# Patient Record
Sex: Male | Born: 1945 | Race: White | Hispanic: No | Marital: Married | Smoking: Former smoker
Health system: Southern US, Community
[De-identification: ages and names within clinical notes are randomized; demographics above are authoritative.]

## PROBLEM LIST (undated history)

## (undated) DIAGNOSIS — I714 Abdominal aortic aneurysm, without rupture, unspecified: Secondary | ICD-10-CM

## (undated) DIAGNOSIS — I4821 Permanent atrial fibrillation: Secondary | ICD-10-CM

## (undated) DIAGNOSIS — I6523 Occlusion and stenosis of bilateral carotid arteries: Secondary | ICD-10-CM

## (undated) DIAGNOSIS — K219 Gastro-esophageal reflux disease without esophagitis: Secondary | ICD-10-CM

## (undated) DIAGNOSIS — J383 Other diseases of vocal cords: Secondary | ICD-10-CM

## (undated) HISTORY — DX: Occlusion and stenosis of bilateral carotid arteries: I65.23

## (undated) HISTORY — DX: Abdominal aortic aneurysm, without rupture, unspecified: I71.40

## (undated) HISTORY — DX: Abdominal aortic aneurysm, without rupture: I71.4

## (undated) HISTORY — DX: Gastro-esophageal reflux disease without esophagitis: K21.9

## (undated) HISTORY — DX: Permanent atrial fibrillation: I48.21

---

## 2009-03-22 ENCOUNTER — Encounter: Admission: RE | Admit: 2009-03-22 | Discharge: 2009-03-22 | Payer: Self-pay | Admitting: Family Medicine

## 2009-03-22 IMAGING — US US SOFT TISSUE HEAD/NECK
1 series · 14 of 18 positions shown · non-contrast
Comparison: None

CLINICAL DATA: Cyst versus mass at the right mandibular angle of
the neck.

THYROID ULTRASOUND
TECHNIQUE: Ultrasound examination of the thyroid gland and
adjacent soft tissues was performed.

[Series 1: us soft tissue head/neck · 0.08mm/px · 14 of 18 slices shown]
[im 1/18]
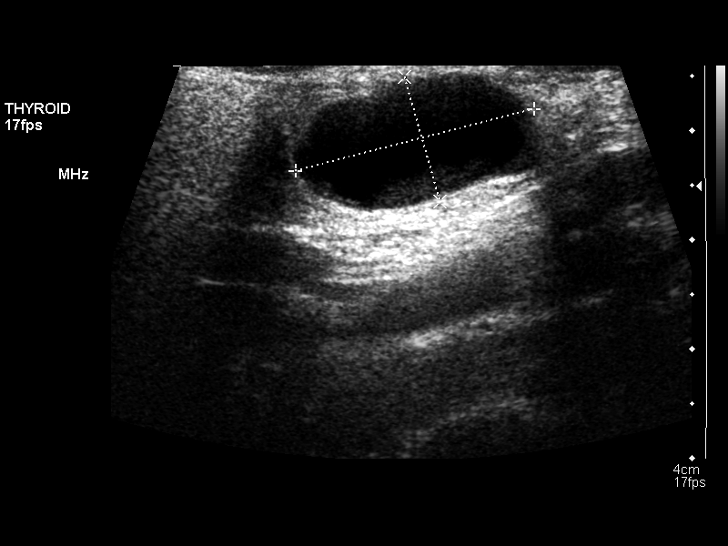
[im 2/18]
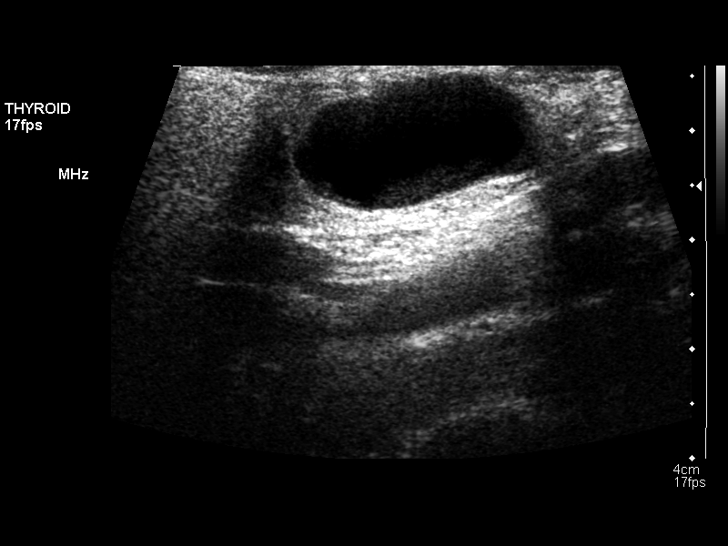
[im 4/18]
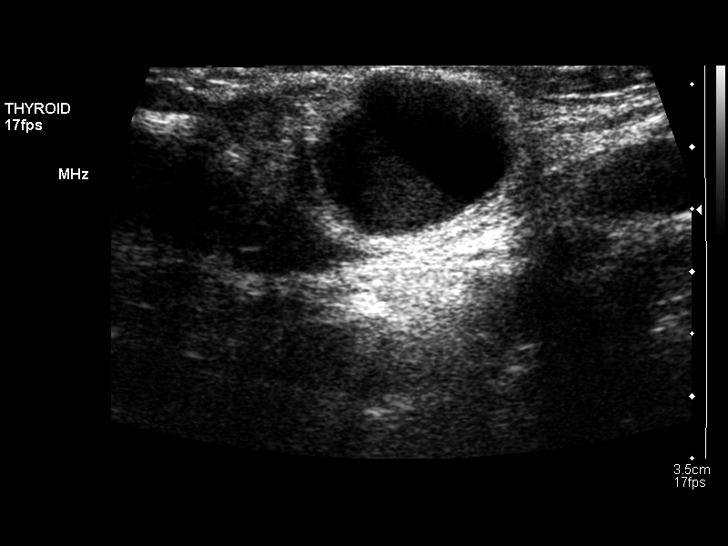
[im 5/18]
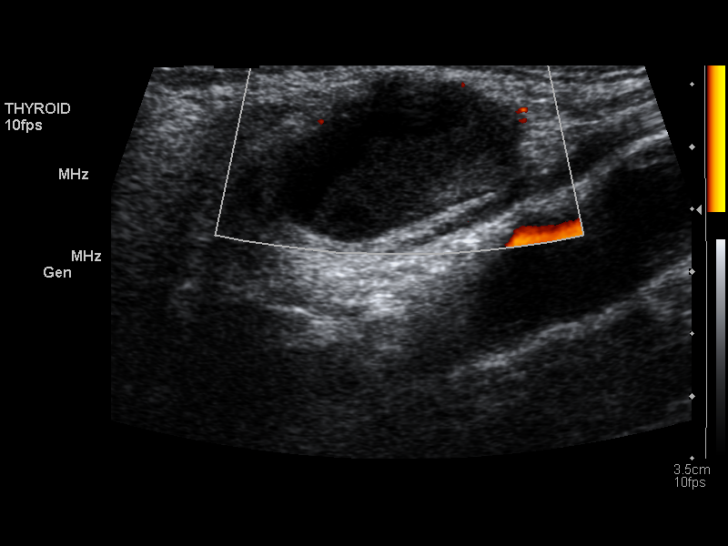
[im 6/18]
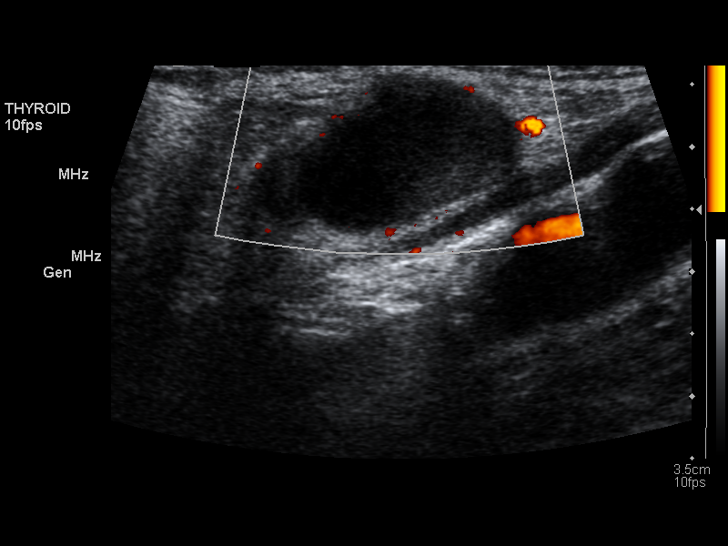
[im 8/18]
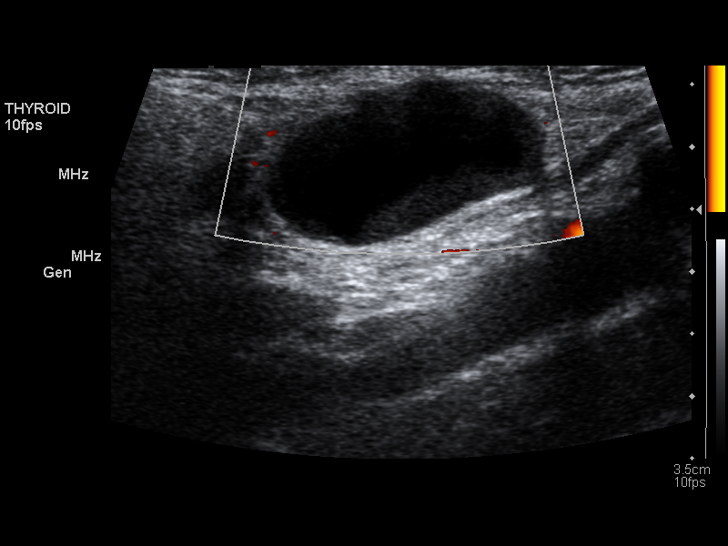
[im 9/18]
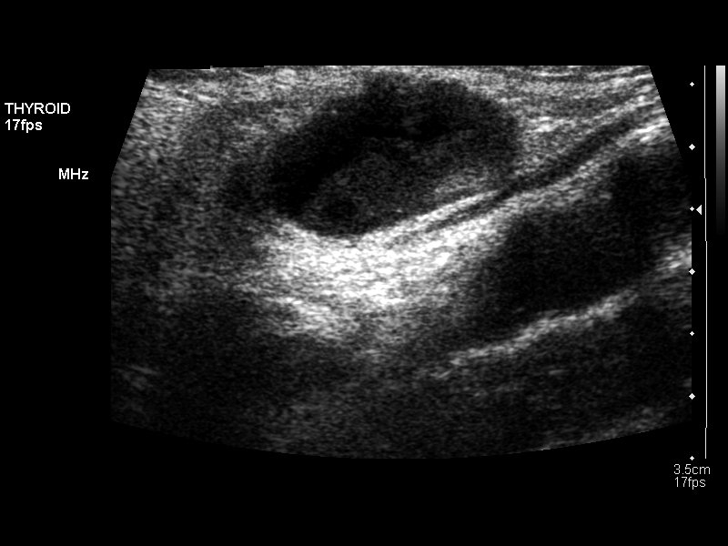
[im 10/18]
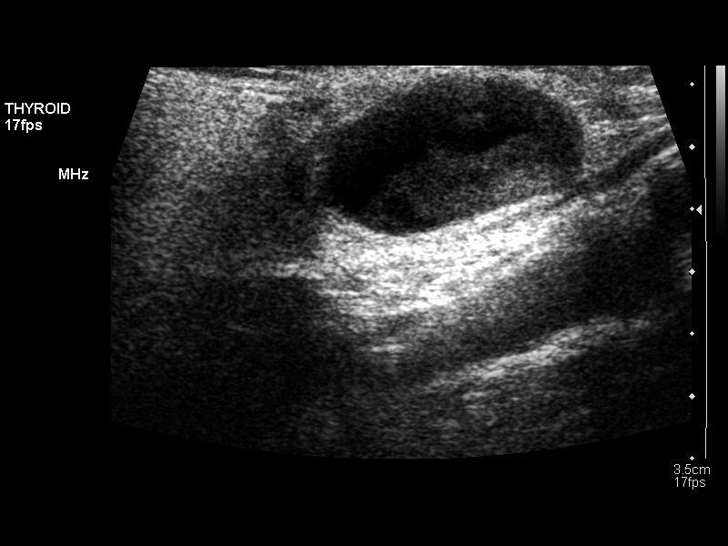
[im 11/18]
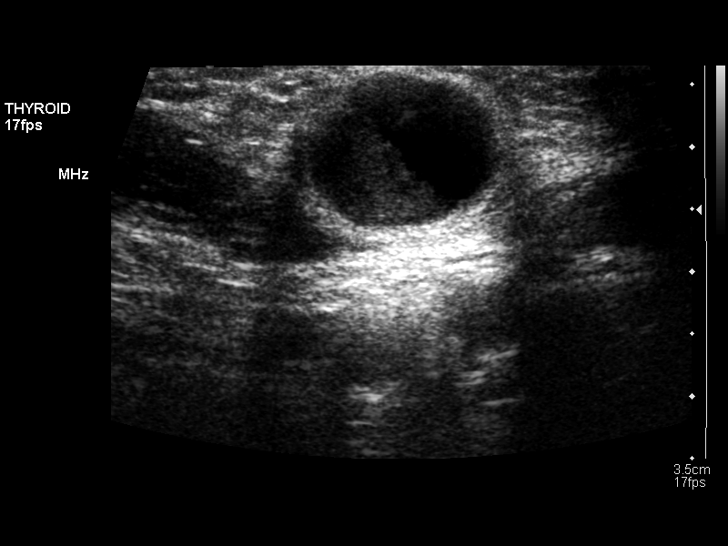
[im 13/18]
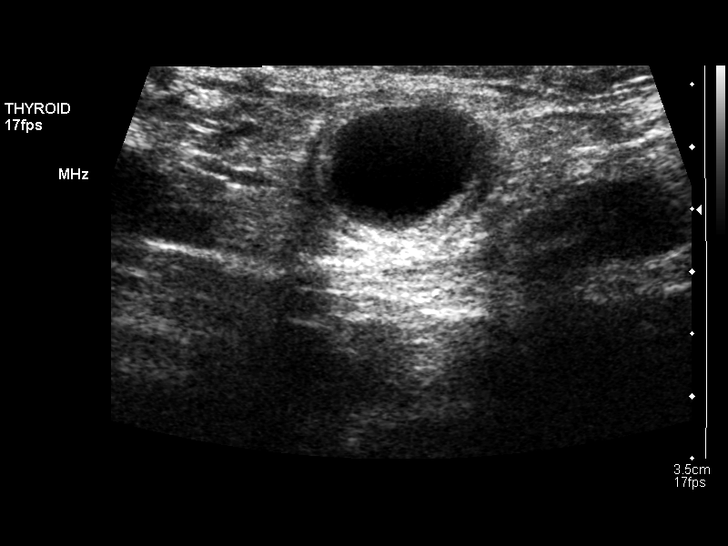
[im 14/18]
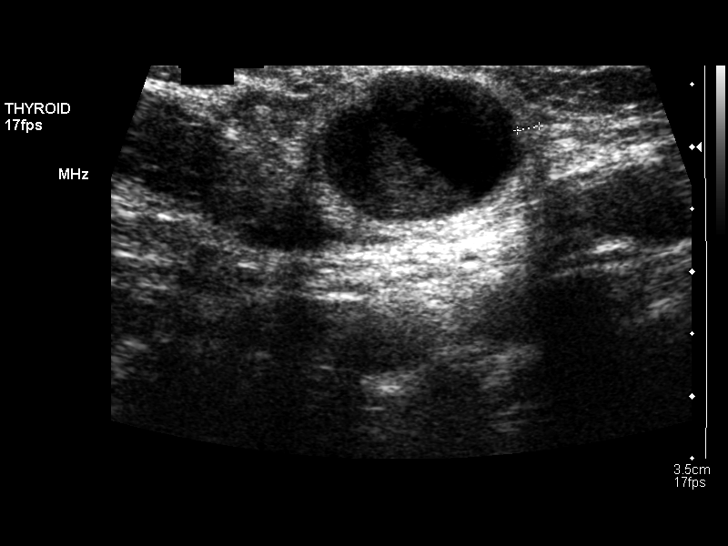
[im 15/18]
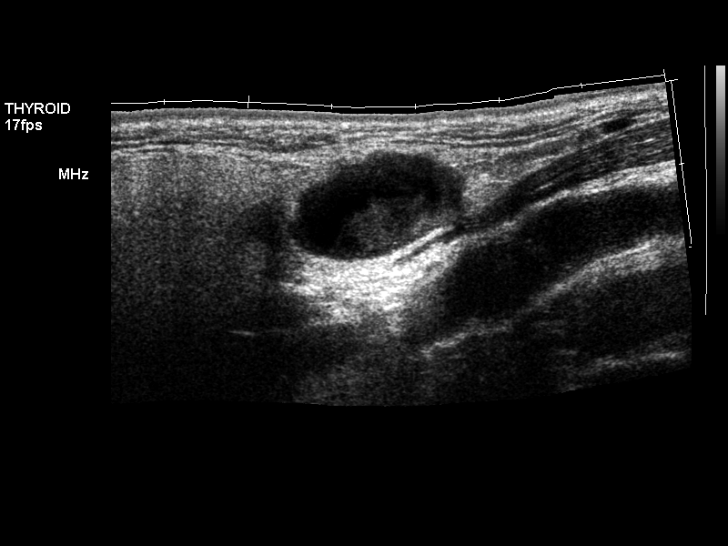
[im 17/18]
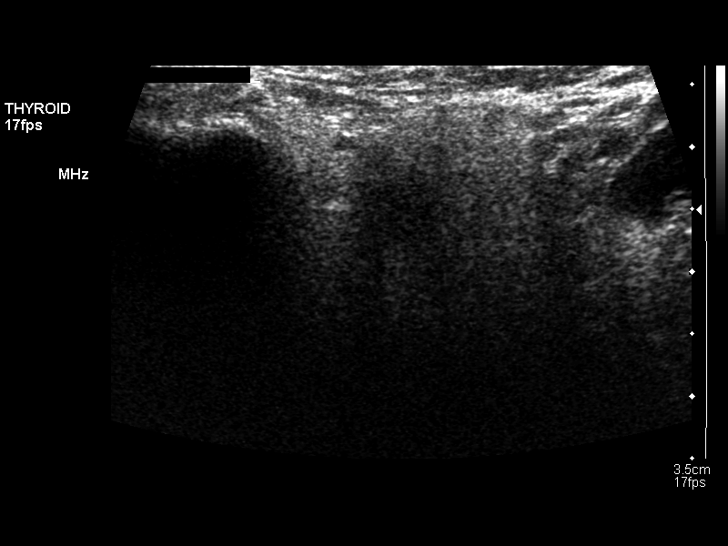
[im 18/18]
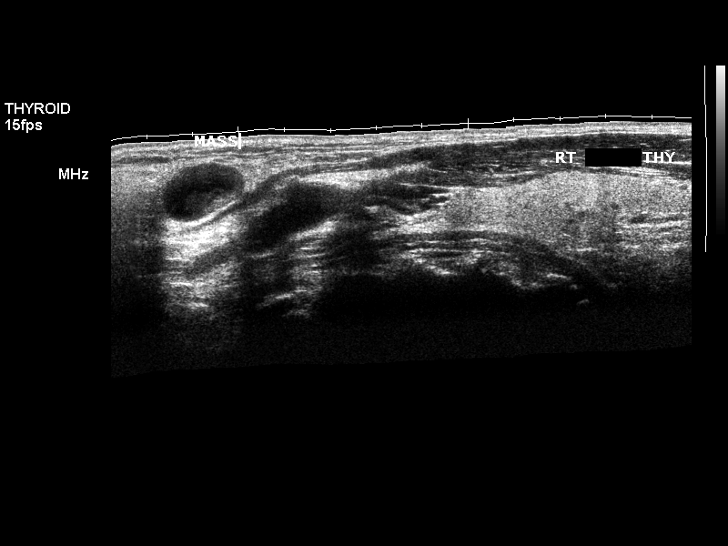

[14 of 18 positions shown; findings below may reference images not displayed]

FINDINGS: In region of palpable finding is discrete cyst with
slight dependent echogenic fluid measuring 2.3 cm long X 1.2 cm AP
X 1.6 cm wide.  No internal or peripheral vascularity related to
this cyst.  No other sonographic cervical adenopathy is seen.  Cyst
is extrinsic to the right parotid and thyroid gland.
IMPRESSION: 1.  Slightly complex marginated cyst at the right jugulodigastric
and neck.  Differential diagnosis includes complex developmental
branchial cleft cyst, dermoid cyst, and  salivary gland cyst within
remnant of the inferior right parotid gland (atypical), and less
likely solitary cystic adenitis or neoplastic solitary adenopathy.
Recommend cyst aspiration for analysis of contents as clinically
indicated.
2.  Otherwise, no significant abnormality.

## 2009-04-22 ENCOUNTER — Ambulatory Visit: Payer: Self-pay | Admitting: Gastroenterology

## 2011-12-27 ENCOUNTER — Encounter: Payer: Self-pay | Admitting: Cardiovascular Disease

## 2012-01-07 ENCOUNTER — Institutional Professional Consult (permissible substitution): Payer: Self-pay | Admitting: Cardiovascular Disease

## 2012-01-28 ENCOUNTER — Encounter: Payer: Self-pay | Admitting: Cardiovascular Disease

## 2012-01-28 ENCOUNTER — Ambulatory Visit (INDEPENDENT_AMBULATORY_CARE_PROVIDER_SITE_OTHER): Payer: Medicare Other | Admitting: Cardiovascular Disease

## 2012-01-28 VITALS — BP 162/78 | HR 59 | Ht 73.0 in | Wt 203.0 lb

## 2012-01-28 DIAGNOSIS — R9431 Abnormal electrocardiogram [ECG] [EKG]: Secondary | ICD-10-CM

## 2012-01-28 DIAGNOSIS — R0989 Other specified symptoms and signs involving the circulatory and respiratory systems: Secondary | ICD-10-CM

## 2012-01-28 DIAGNOSIS — I10 Essential (primary) hypertension: Secondary | ICD-10-CM

## 2012-01-28 NOTE — Assessment & Plan Note (Signed)
Blood pressure is elevated on a single antihypertensive drug. I asked the patient to obtain a home blood pressure cuff and bring his readings than when he returns for followup. I suspect he will require a second drug.

## 2012-01-28 NOTE — Assessment & Plan Note (Addendum)
The patient has cardiovascular risk factors of hypertension, former tobacco use, and family history of CAD albeit not premature CAD. His EKG is concerning for a prior anteroseptal infarct. However, it's possible that this is all related to an intraventricular conduction delay. The patient has evidence of subclinical atherosclerosis with a right carotid bruit on exam. I would recommend proceeding with an exercise Myoview stress test to rule out obstructive CAD and to evaluate him for evidence of a prior myocardial infarction. Will followup in about 4-6 weeks with a return office visit as well.  ADDENDUM (02/24/12): Received notice that pt is to have colonoscopy and clearance requested. He is at low risk of cardiovascular problems related to colonoscopy and can proceed without further testing. He recently had a stress test demonstrating no significant ischemia.  [name] 02/24/2012 7:28 PM

## 2012-01-28 NOTE — Assessment & Plan Note (Signed)
Will check a carotid duplex to rule out significant carotid stenosis.

## 2012-01-28 NOTE — Progress Notes (Signed)
HPI:  66 year old gentleman presenting for initial cardiac evaluation. The patient is referred because of an abnormal EKG. His EKG shows an intraventricular conduction delay and question of anteroseptal myocardial infarction age undetermined. The patient has been previously healthy. He does have hypertension which is newly diagnosed. He is not received routine medical care over the years, but he recently established with a primary care provider. He has been started on lisinopril 20 mg daily.  The patient is physically active. He does hard work and has no symptoms with exertion. He specifically denies chest pain or pressure. He notices that he gets a little bit out of breath when he climbs up to 4 flights of stairs at one time. He denies orthopnea, edema, palpitations, or weakness. He has occasional lightheadedness but has not had syncope. He has never had a prolonged episode of chest pain.  Outpatient Encounter Prescriptions as of 01/28/2012  Medication Sig Dispense Refill  . Ascorbic Acid (VITAMIN C) 1000 MG tablet Take 1,000 mg by mouth 2 (two) times daily.      Marland Kitchen aspirin 81 MG tablet Take 81 mg by mouth daily.      . cyanocobalamin 2000 MCG tablet Take 2,000 mcg by mouth daily.      Marland Kitchen lisinopril (PRINIVIL,ZESTRIL) 20 MG tablet Take 20 mg by mouth daily.       . Multiple Vitamin (MULTIVITAMIN) tablet Take 1 tablet by mouth daily.      Marland Kitchen omeprazole (PRILOSEC) 10 MG capsule Take 10 mg by mouth daily.      . vitamin E 400 UNIT capsule Take 400 Units by mouth daily.        Sulfonamide derivatives  Past Medical History  Diagnosis Date  . Essential hypertension   . Gastroesophageal reflux disease     No past surgical history on file.  History   Social History  . Marital Status: Divorced    Spouse Name: N/A    Number of Children: N/A  . Years of Education: N/A   Occupational History  . Not on file.   Social History Main Topics  . Smoking status: Former Games developer  . Smokeless tobacco: Not  on file  . Alcohol Use: Not on file  . Drug Use: Not on file  . Sexually Active: Not on file   Other Topics Concern  . Not on file   Social History Narrative  . No narrative on file    Family History  Problem Relation Age of Onset  . Coronary artery disease Mother 12    myocardial infarction    ROS: General: no fevers/chills/night sweats Eyes: no blurry vision, diplopia, or amaurosis ENT: no sore throat or hearing loss Resp: no cough, wheezing, or hemoptysis CV: no edema or palpitations GI: no abdominal pain, nausea, vomiting, diarrhea, or constipation GU: no dysuria, frequency, or hematuria Skin: no rash Neuro: no headache, numbness, tingling, or weakness of extremities Musculoskeletal: no joint pain or swelling Heme: no bleeding, DVT, or easy bruising Endo: no polydipsia or polyuria  BP 162/78  Pulse 59  Ht 6\' 1"  (1.854 m)  Wt 92.08 kg (203 lb)  BMI 26.78 kg/m2  PHYSICAL EXAM: Pt is alert and oriented, WD, WN, in no distress. HEENT: normal Neck: JVP normal. Carotid upstrokes normal with a right carotid bruit. No thyromegaly. Lungs: equal expansion, clear bilaterally CV: Apex is discrete and nondisplaced, RRR without murmur or gallop Abd: soft, NT, +BS, no bruit, no hepatosplenomegaly Back: no CVA tenderness Ext: no C/C/E  Femoral pulses 2+= without bruits        DP/PT pulses intact and = Skin: warm and dry without rash Neuro: CNII-XII intact             Strength intact = bilaterally  EKG:  Sinus bradycardia 59 beats per minute tonic anteroseptal infarct age undetermined. Nonspecific IVCD.  ASSESSMENT AND PLAN:

## 2012-01-28 NOTE — Patient Instructions (Addendum)
Your physician has requested that you have a carotid duplex. This test is an ultrasound of the carotid arteries in your neck. It looks at blood flow through these arteries that supply the brain with blood. Allow one hour for this exam. There are no restrictions or special instructions.  Your physician has requested that you have a lexiscan myoview. For further information please visit [email or web address] Please follow instruction sheet, as given.  The current medical regimen is effective;  continue present plan and medications.  Please obtain a blood pressure monitor and keep a daily log of your readings.  Follow up in 6 weeks

## 2012-01-30 NOTE — Progress Notes (Signed)
Addended by: Judithe Modest D on: 01/30/2012 03:04 PM   Modules accepted: Orders

## 2012-02-01 ENCOUNTER — Encounter (INDEPENDENT_AMBULATORY_CARE_PROVIDER_SITE_OTHER): Payer: Medicare Other

## 2012-02-01 DIAGNOSIS — R0989 Other specified symptoms and signs involving the circulatory and respiratory systems: Secondary | ICD-10-CM

## 2012-02-01 DIAGNOSIS — I6529 Occlusion and stenosis of unspecified carotid artery: Secondary | ICD-10-CM

## 2012-02-01 DIAGNOSIS — R9431 Abnormal electrocardiogram [ECG] [EKG]: Secondary | ICD-10-CM

## 2012-02-07 ENCOUNTER — Ambulatory Visit (HOSPITAL_COMMUNITY): Payer: Medicare Other | Attending: Cardiology | Admitting: Radiology

## 2012-02-07 VITALS — BP 160/72 | Ht 73.0 in | Wt 195.0 lb

## 2012-02-07 DIAGNOSIS — Z8249 Family history of ischemic heart disease and other diseases of the circulatory system: Secondary | ICD-10-CM | POA: Insufficient documentation

## 2012-02-07 DIAGNOSIS — I10 Essential (primary) hypertension: Secondary | ICD-10-CM | POA: Insufficient documentation

## 2012-02-07 DIAGNOSIS — Z87891 Personal history of nicotine dependence: Secondary | ICD-10-CM | POA: Insufficient documentation

## 2012-02-07 DIAGNOSIS — R42 Dizziness and giddiness: Secondary | ICD-10-CM | POA: Insufficient documentation

## 2012-02-07 DIAGNOSIS — R0602 Shortness of breath: Secondary | ICD-10-CM

## 2012-02-07 DIAGNOSIS — R0609 Other forms of dyspnea: Secondary | ICD-10-CM | POA: Insufficient documentation

## 2012-02-07 DIAGNOSIS — R9431 Abnormal electrocardiogram [ECG] [EKG]: Secondary | ICD-10-CM

## 2012-02-07 DIAGNOSIS — I447 Left bundle-branch block, unspecified: Secondary | ICD-10-CM | POA: Insufficient documentation

## 2012-02-07 DIAGNOSIS — R0989 Other specified symptoms and signs involving the circulatory and respiratory systems: Secondary | ICD-10-CM | POA: Insufficient documentation

## 2012-02-07 MED ORDER — TECHNETIUM TC 99M TETROFOSMIN IV KIT
11.0000 | PACK | Freq: Once | INTRAVENOUS | Status: AC | PRN
  Administered 2012-02-07: 11 via INTRAVENOUS

## 2012-02-07 MED ORDER — TECHNETIUM TC 99M TETROFOSMIN IV KIT
32.9000 | PACK | Freq: Once | INTRAVENOUS | Status: AC | PRN
  Administered 2012-02-07: 32.9 via INTRAVENOUS

## 2012-02-07 MED ORDER — REGADENOSON 0.4 MG/5ML IV SOLN
0.4000 mg | Freq: Once | INTRAVENOUS | Status: AC
  Administered 2012-02-07: 0.4 mg via INTRAVENOUS

## 2012-02-07 NOTE — Progress Notes (Signed)
[facility] SITE 3 NUCLEAR MED [address] [place] [address] [phone]  Cardiology Nuclear Med Study  Leroy Lara is a 66 y.o. male [id number] 02-01-1946   Nuclear Med Background Indication for Stress Test:  Evaluation for Ischemia and Abnormal EKG History:  No previous documented CAD Cardiac Risk Factors: Family History - CAD, History of Smoking, Hypertension and LBBB  Symptoms:  DOE and Light-Headedness   Nuclear Pre-Procedure Caffeine/Decaff Intake:  None NPO After: 7:30pm   Lungs:  Clear.  O2 SAT 96%. IV 0.9% NS with Angio Cath:  20g  IV Site: R Antecubital x 1, tolerated well IV Started by:  Irean Hong, RN  Chest Size (in):  42 Cup Size: n/a  Height: 6\' 1"  (1.854 m)  Weight:  195 lb (88.451 kg)  BMI:  Body mass index is 25.73 kg/(m^2). Tech Comments:  AM medications taken, per patient.    Nuclear Med Study 1 or 2 day study: 1 day  Stress Test Type:  Eugenie Birks  Reading MD: Marca Ancona, MD  Order Authorizing Provider:  [name], MD  Resting Radionuclide: Technetium 32m Tetrofosmin  Resting Radionuclide Dose: 11.0 mCi   Stress Radionuclide:  Technetium 68m Tetrofosmin  Stress Radionuclide Dose: 32.9 mCi           Stress Protocol Rest HR: 59 Stress HR: 83  Rest BP: Sitting:160/72  Standing:167/90 Stress BP: 156/90  Exercise Time (min): n/a METS: n/a   Predicted Max HR: 155 bpm % Max HR: 53.55 bpm Rate Pressure Product: 21308   Dose of Adenosine (mg):  n/a Dose of Lexiscan: 0.4 mg  Dose of Atropine (mg): n/a Dose of Dobutamine: n/a mcg/kg/min (at max HR)  Stress Test Technologist: Smiley Houseman, CMA-N  Nuclear Technologist:  Domenic Polite, CNMT     Rest Procedure:  Myocardial perfusion imaging was performed at rest 45 minutes following the intravenous administration of Technetium 52m Tetrofosmin.  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  Stress Procedure: The patient was initially scheduled  to walk the treadmill utilizing the Bruce protocol, but his EKG showed  a LBBB on baseline and was changed to [place], per Dr. Excell Seltzer.  The patient then received IV Lexiscan 0.4 mg over 15-seconds.  Technetium 2m Tetrofosmin injected at 30-seconds.  There were no significant changes with Lexiscan.  Quantitative spect images were obtained after a 45 minute delay.  Stress ECG: No significant change from baseline ECG  QPS Raw Data Images:  Normal; no motion artifact; normal heart/lung ratio. Stress Images:  Small apical perfusion defect.  Rest Images:  Small apical perfusion defect.  Subtraction (SDS):  Small fixed apical perfusion defect.  Transient Ischemic Dilatation (Normal <1.22):  1.09 Lung/Heart Ratio (Normal <0.45):  0.27  Quantitative Gated Spect Images QGS EDV:  185 ml QGS ESV:  101 ml QGS cine images:  Global hypokinesis.  QGS EF: 46%  Impression Exercise Capacity:  Lexiscan with no exercise. BP Response:  Normal blood pressure response. Clinical Symptoms:  Chest tightness.  ECG Impression:  LBBB, no change with infusion.  Comparison with Prior Nuclear Study: No previous nuclear study performed  Overall Impression:  Low risk stress nuclear study.  Small fixed apical perfusion defect likely represents apical thinning.  No evidence for ischemia or infarction.  EF is mildly decreased at 42%.  Suggest echo to reassess LV systolic function.   Leroy Lara [employer]

## 2012-02-14 ENCOUNTER — Other Ambulatory Visit: Payer: Self-pay

## 2012-02-14 DIAGNOSIS — R9431 Abnormal electrocardiogram [ECG] [EKG]: Secondary | ICD-10-CM

## 2012-02-14 DIAGNOSIS — I10 Essential (primary) hypertension: Secondary | ICD-10-CM

## 2012-02-21 ENCOUNTER — Telehealth: Payer: Self-pay | Admitting: Cardiovascular Disease

## 2012-02-21 NOTE — Telephone Encounter (Signed)
Pt is having a colonoscopy on 3/18 with Dr. Stephens Shire and he needs a letter a clearance the office phone# [phone]  Fax# [phone]

## 2012-02-21 NOTE — Telephone Encounter (Signed)
Spoke with patient and he is scheduled April 18/19 for colonoscopy.  Explained Dr Excell Seltzer out of office until next week, will forward to him as well as Customer service manager.  Advised someone would call him once completed.

## 2012-02-25 NOTE — Telephone Encounter (Signed)
Dr Excell Seltzer did addendum on 01/28/12 office note.  Note faxed to Dr [facility] office. I left a detailed message on the pt's voicemail that he is cleared for colonoscopy.

## 2012-02-26 ENCOUNTER — Ambulatory Visit (HOSPITAL_COMMUNITY): Payer: Medicare Other | Attending: Cardiology

## 2012-02-26 ENCOUNTER — Other Ambulatory Visit: Payer: Self-pay

## 2012-02-26 DIAGNOSIS — I10 Essential (primary) hypertension: Secondary | ICD-10-CM | POA: Insufficient documentation

## 2012-02-26 DIAGNOSIS — R9431 Abnormal electrocardiogram [ECG] [EKG]: Secondary | ICD-10-CM | POA: Insufficient documentation

## 2012-04-08 ENCOUNTER — Encounter: Payer: Self-pay | Admitting: Cardiovascular Disease

## 2012-04-08 ENCOUNTER — Ambulatory Visit (INDEPENDENT_AMBULATORY_CARE_PROVIDER_SITE_OTHER): Payer: Medicare Other | Admitting: Cardiovascular Disease

## 2012-04-08 VITALS — BP 150/84 | HR 57 | Ht 73.0 in | Wt 201.0 lb

## 2012-04-08 DIAGNOSIS — I10 Essential (primary) hypertension: Secondary | ICD-10-CM

## 2012-04-08 MED ORDER — LISINOPRIL-HYDROCHLOROTHIAZIDE 20-25 MG PO TABS: 1.0000 | ORAL_TABLET | Freq: Every day | ORAL | Status: DC

## 2012-04-08 NOTE — Patient Instructions (Signed)
Your physician wants you to follow-up in: 1 year with Dr. Excell Seltzer. You will receive a reminder letter in the mail two months in advance. If you don't receive a letter, please call our office to schedule the follow-up appointment.  Lab work in 2 weeks.  BMET  Stop taking plain lisinopril.  Start Lisinopril/HCTZ 20/25 daily.

## 2012-04-09 ENCOUNTER — Encounter: Payer: Self-pay | Admitting: Cardiovascular Disease

## 2012-04-09 NOTE — Progress Notes (Signed)
   HPI:  This is a 66 year old gentleman presenting for followup evaluation. The patient was evaluated in February 2013 for an abnormal EKG. He underwent an echocardiogram and a stress Myoview scan. No significant abnormalities were noted. There was a small, fixed apical perfusion defect on the nuclear scan but there was no evidence of reversible ischemia. There was a question of decreased left ventricular ejection fraction, calculated at 42%. However, the patient's followup echocardiogram showed normal LV function without regional wall motion abnormalities. His LVEF was in the range of 50-55%.  He feels well. He denies chest pain, dyspnea, or palpitations. He notes that his blood pressures have been elevated even on home readings with systolics in the 150s.  Outpatient Encounter Prescriptions as of 04/08/2012  Medication Sig Dispense Refill  . Ascorbic Acid (VITAMIN C) 1000 MG tablet Take 1,000 mg by mouth 2 (two) times daily.      Marland Kitchen aspirin 81 MG tablet Take 81 mg by mouth daily.      . cyanocobalamin 2000 MCG tablet Take 2,000 mcg by mouth daily.      . Multiple Vitamin (MULTIVITAMIN) tablet Take 1 tablet by mouth daily.      Marland Kitchen omeprazole (PRILOSEC) 10 MG capsule Take 10 mg by mouth daily.      . vitamin E 400 UNIT capsule Take 400 Units by mouth daily.      Marland Kitchen DISCONTD: lisinopril (PRINIVIL,ZESTRIL) 20 MG tablet Take 20 mg by mouth daily.       Marland Kitchen lisinopril-hydrochlorothiazide (PRINZIDE,ZESTORETIC) 20-25 MG per tablet Take 1 tablet by mouth daily.  30 tablet  11    Allergies  Allergen Reactions  . Sulfonamide Derivatives     REACTION: Painful eyes    Past Medical History  Diagnosis Date  . Essential hypertension   . Gastroesophageal reflux disease     ROS: Negative except as per HPI  BP 150/84  Pulse 57  Ht 6\' 1"  (1.854 m)  Wt 91.173 kg (201 lb)  BMI 26.52 kg/m2  PHYSICAL EXAM: Pt is alert and oriented, NAD HEENT: normal Neck: JVP - normal, carotids 2+= with a right carotid  bruit Lungs: CTA bilaterally CV: RRR without murmur or gallop Abd: soft, NT, Positive BS, no hepatomegaly Ext: no C/C/E, distal pulses intact and equal Skin: warm/dry no rash  ASSESSMENT AND PLAN: Essential hypertension: The patient's blood pressure control is suboptimal. He is currently on lisinopril 20 mg daily. I recommended that he change to lisinopril/hydrochlorothiazide 20/25 milligrams daily. He should have a metabolic panel checked in about 2 weeks. We will continue to follow with his primary care physician and I will plan on seeing him back in one year for followup.  [name] 04/09/2012

## 2012-04-22 ENCOUNTER — Other Ambulatory Visit (INDEPENDENT_AMBULATORY_CARE_PROVIDER_SITE_OTHER): Payer: Medicare Other

## 2012-04-22 DIAGNOSIS — I10 Essential (primary) hypertension: Secondary | ICD-10-CM

## 2012-04-22 LAB — BASIC METABOLIC PANEL
BUN: 19 mg/dL (ref 6–23)
Calcium: 8.7 mg/dL (ref 8.4–10.5)
Creatinine, Ser: 0.8 mg/dL (ref 0.4–1.5)
GFR: 101.31 mL/min (ref >60.00)

## 2013-02-27 ENCOUNTER — Encounter (INDEPENDENT_AMBULATORY_CARE_PROVIDER_SITE_OTHER): Payer: Medicare Other

## 2013-02-27 DIAGNOSIS — I6529 Occlusion and stenosis of unspecified carotid artery: Secondary | ICD-10-CM

## 2013-04-24 ENCOUNTER — Ambulatory Visit (INDEPENDENT_AMBULATORY_CARE_PROVIDER_SITE_OTHER): Payer: Medicare Other | Admitting: Cardiovascular Disease

## 2013-04-24 ENCOUNTER — Encounter: Payer: Self-pay | Admitting: Cardiovascular Disease

## 2013-04-24 VITALS — BP 124/86 | HR 55 | Ht 73.0 in | Wt 204.0 lb

## 2013-04-24 DIAGNOSIS — R9431 Abnormal electrocardiogram [ECG] [EKG]: Secondary | ICD-10-CM

## 2013-04-24 DIAGNOSIS — E7800 Pure hypercholesterolemia, unspecified: Secondary | ICD-10-CM

## 2013-04-24 DIAGNOSIS — I10 Essential (primary) hypertension: Secondary | ICD-10-CM

## 2013-04-24 MED ORDER — LOSARTAN POTASSIUM-HCTZ 50-12.5 MG PO TABS: 1.0000 | ORAL_TABLET | Freq: Every day | ORAL | Status: DC

## 2013-04-24 NOTE — Patient Instructions (Addendum)
Your physician has recommended you make the following change in your medication: STOP Lisinopril HCT, START Losartan HCT 50/12.5mg  take one by mouth daily  Your physician recommends that you return for a FASTING LIPID, LIVER and BMP--nothing to eat or drink after midnight, lab opens at 7:30  Your physician wants you to follow-up in: 1 YEAR with Dr Excell Seltzer.  You will receive a reminder letter in the mail two months in advance. If you don't receive a letter, please call our office to schedule the follow-up appointment.

## 2013-04-24 NOTE — Progress Notes (Signed)
   HPI:  67 year old gentleman presenting for followup evaluation. The patient was initially seen in 2013 for evaluation of an abnormal EKG. He underwent an echocardiogram and a stress nuclear scan. There were no significant animality is noted. He did have a small fixed apical perfusion defect on his nuclear stress test but there was no evidence of reversible ischemia. His gated left ventricular ejection fraction was low at 42%, but an echocardiogram was performed in this showed an estimated LVEF of 50-55%.  The patient is doing fine. He remains very active with his work. He denies exertional symptoms. He specifically denies chest pain, chest pressure, shortness of breath, edema, or palpitations. He does admit to occasional dizziness.  He ran out of his antihypertensive medicines and has been off of them now for several weeks. He noticed that his chronic cough has resolved since stopping lisinopril.    Outpatient Encounter Prescriptions as of 04/24/2013  Medication Sig Dispense Refill  . Ascorbic Acid (VITAMIN C) 1000 MG tablet Take 1,000 mg by mouth 2 (two) times daily.      Marland Kitchen aspirin 81 MG tablet Take 81 mg by mouth daily.      . cyanocobalamin 2000 MCG tablet Take 2,000 mcg by mouth daily.      Marland Kitchen lisinopril-hydrochlorothiazide (PRINZIDE,ZESTORETIC) 20-25 MG per tablet Take 1 tablet by mouth daily.  30 tablet  11  . Multiple Vitamin (MULTIVITAMIN) tablet Take 1 tablet by mouth daily.      Marland Kitchen omeprazole (PRILOSEC) 40 MG capsule Take 1 capsule by mouth 2 (two) times daily.      . vitamin E 400 UNIT capsule Take 400 Units by mouth daily.      . [DISCONTINUED] omeprazole (PRILOSEC) 10 MG capsule Take 10 mg by mouth daily.       No facility-administered encounter medications on file as of 04/24/2013.    Allergies  Allergen Reactions  . Sulfonamide Derivatives     REACTION: Painful eyes    Past Medical History  Diagnosis Date  . Essential hypertension   . Gastroesophageal reflux disease      ROS: Negative except as per HPI  BP 124/86  Pulse 55  Ht 6\' 1"  (1.854 m)  Wt 92.534 kg (204 lb)  BMI 26.92 kg/m2  PHYSICAL EXAM: Pt is alert and oriented, NAD HEENT: normal Neck: JVP - normal, carotids 2+= without bruits Lungs: CTA bilaterally CV: RRR without murmur or gallop Abd: soft, NT, Positive BS, no hepatomegaly Ext: no C/C/E, distal pulses intact and equal Skin: warm/dry no rash  EKG:  Sinus bradycardia 55 beats per minute, left bundle branch block.  ASSESSMENT AND PLAN: Essential HTN - out of meds. Notes BP at home is elevated at times, on average greater than 140/85. Rx written for losartan HCT 50/12.5 mg daily. Avoid ACE-I because of cough. Follow-up one year.  Lipids - no record of lipid panel. Will check with baseline labs.  For follow-up I would like to see him back in one year.  [name] 04/25/2013 2:54 PM

## 2013-04-28 ENCOUNTER — Other Ambulatory Visit (INDEPENDENT_AMBULATORY_CARE_PROVIDER_SITE_OTHER): Payer: Medicare Other

## 2013-04-28 DIAGNOSIS — I10 Essential (primary) hypertension: Secondary | ICD-10-CM

## 2013-04-28 DIAGNOSIS — E7800 Pure hypercholesterolemia, unspecified: Secondary | ICD-10-CM

## 2013-04-28 LAB — HEPATIC FUNCTION PANEL
AST: 26 U/L (ref 0–37)
Albumin: 3.3 g/dL — ABNORMAL LOW (ref 3.5–5.2)
Alkaline Phosphatase: 45 U/L (ref 39–117)
Bilirubin, Direct: 0 mg/dL (ref 0.0–0.3)
Total Protein: 6.6 g/dL (ref 6.0–8.3)

## 2013-04-28 LAB — LIPID PANEL
Cholesterol: 196 mg/dL (ref 0–200)
LDL Cholesterol: 134 mg/dL — ABNORMAL HIGH (ref 0–99)
Triglycerides: 81 mg/dL (ref 0.0–149.0)

## 2013-04-28 LAB — BASIC METABOLIC PANEL
BUN: 13 mg/dL (ref 6–23)
Calcium: 8.3 mg/dL — ABNORMAL LOW (ref 8.4–10.5)
Chloride: 104 mEq/L (ref 96–112)
Creatinine, Ser: 0.9 mg/dL (ref 0.4–1.5)

## 2013-05-26 ENCOUNTER — Telehealth: Payer: Self-pay | Admitting: Cardiovascular Disease

## 2013-05-26 DIAGNOSIS — Z5181 Encounter for therapeutic drug level monitoring: Secondary | ICD-10-CM

## 2013-05-26 DIAGNOSIS — E7800 Pure hypercholesterolemia, unspecified: Secondary | ICD-10-CM

## 2013-05-26 DIAGNOSIS — I10 Essential (primary) hypertension: Secondary | ICD-10-CM

## 2013-05-26 MED ORDER — ATORVASTATIN CALCIUM 10 MG PO TABS: 10.0000 mg | ORAL_TABLET | Freq: Every day | ORAL | Status: DC

## 2013-05-26 NOTE — Telephone Encounter (Signed)
New problem   Pt want to know results of lab. Pt is returning your call.

## 2013-05-26 NOTE — Telephone Encounter (Signed)
Pt is aware of lab results and and MD's recommendations. A prescription for Atorvastatin 10 mg once daily sent to [facility] in [place]. An appointment was made for Lipid panel and LFT for September 17 th 2014. In AM pt aware.

## 2013-08-26 ENCOUNTER — Other Ambulatory Visit (INDEPENDENT_AMBULATORY_CARE_PROVIDER_SITE_OTHER): Payer: Medicare Other

## 2013-08-26 DIAGNOSIS — I10 Essential (primary) hypertension: Secondary | ICD-10-CM

## 2013-08-26 DIAGNOSIS — Z5181 Encounter for therapeutic drug level monitoring: Secondary | ICD-10-CM

## 2013-08-26 DIAGNOSIS — E7800 Pure hypercholesterolemia, unspecified: Secondary | ICD-10-CM

## 2013-08-26 LAB — HEPATIC FUNCTION PANEL
ALT: 21 U/L (ref 0–53)
AST: 26 U/L (ref 0–37)
Albumin: 3.8 g/dL (ref 3.5–5.2)
Alkaline Phosphatase: 46 U/L (ref 39–117)
Total Bilirubin: 0.6 mg/dL (ref 0.3–1.2)

## 2013-08-26 LAB — LIPID PANEL
Cholesterol: 230 mg/dL — ABNORMAL HIGH (ref 0–200)
Total CHOL/HDL Ratio: 4

## 2013-09-14 ENCOUNTER — Telehealth: Payer: Self-pay | Admitting: Cardiovascular Disease

## 2013-09-14 DIAGNOSIS — E7800 Pure hypercholesterolemia, unspecified: Secondary | ICD-10-CM

## 2013-09-14 MED ORDER — ATORVASTATIN CALCIUM 10 MG PO TABS: 10.0000 mg | ORAL_TABLET | Freq: Every day | ORAL | Status: DC

## 2013-09-14 NOTE — Telephone Encounter (Signed)
Follow Up  ° °Pt returned call for results °

## 2013-09-14 NOTE — Telephone Encounter (Signed)
Spoke with pt and reviewed lab results and recommendations from Dr. Excell Seltzer with him. He had been taking atorvastatin but stopped it "awhile ago."  He states he noticed lowered energy level and felt stiff in the morning when taking. I offered to check with Dr. Excell Seltzer to see about a different medication but pt states he would like to try atorvastatin again. He will let us know if symptoms return after resuming. He will come in for fasting lab work the last week in December

## 2013-12-08 ENCOUNTER — Other Ambulatory Visit (INDEPENDENT_AMBULATORY_CARE_PROVIDER_SITE_OTHER): Payer: Medicare Other

## 2013-12-08 DIAGNOSIS — E7800 Pure hypercholesterolemia, unspecified: Secondary | ICD-10-CM

## 2013-12-08 LAB — LIPID PANEL
HDL: 54.3 mg/dL (ref >39.00)
Total CHOL/HDL Ratio: 4
VLDL: 20.8 mg/dL (ref 0.0–40.0)

## 2013-12-08 LAB — HEPATIC FUNCTION PANEL
Bilirubin, Direct: 0.1 mg/dL (ref 0.0–0.3)
Total Bilirubin: 0.8 mg/dL (ref 0.3–1.2)

## 2014-03-17 ENCOUNTER — Other Ambulatory Visit (HOSPITAL_COMMUNITY): Payer: Self-pay | Admitting: Cardiology

## 2014-03-17 DIAGNOSIS — I6529 Occlusion and stenosis of unspecified carotid artery: Secondary | ICD-10-CM

## 2014-03-22 ENCOUNTER — Encounter: Payer: Self-pay | Admitting: Cardiovascular Disease

## 2014-03-23 ENCOUNTER — Encounter: Payer: Self-pay | Admitting: Cardiovascular Disease

## 2014-03-24 ENCOUNTER — Ambulatory Visit (HOSPITAL_COMMUNITY): Payer: Medicare Other | Attending: Internal Medicine | Admitting: Cardiology

## 2014-03-24 ENCOUNTER — Encounter (INDEPENDENT_AMBULATORY_CARE_PROVIDER_SITE_OTHER): Payer: Self-pay

## 2014-03-24 ENCOUNTER — Encounter: Payer: Self-pay | Admitting: Internal Medicine

## 2014-03-24 DIAGNOSIS — I6529 Occlusion and stenosis of unspecified carotid artery: Secondary | ICD-10-CM | POA: Insufficient documentation

## 2014-03-24 DIAGNOSIS — Z87891 Personal history of nicotine dependence: Secondary | ICD-10-CM | POA: Insufficient documentation

## 2014-03-24 DIAGNOSIS — R0989 Other specified symptoms and signs involving the circulatory and respiratory systems: Secondary | ICD-10-CM | POA: Insufficient documentation

## 2014-03-24 DIAGNOSIS — Z8673 Personal history of transient ischemic attack (TIA), and cerebral infarction without residual deficits: Secondary | ICD-10-CM | POA: Insufficient documentation

## 2014-03-24 DIAGNOSIS — I2510 Atherosclerotic heart disease of native coronary artery without angina pectoris: Secondary | ICD-10-CM | POA: Insufficient documentation

## 2014-03-24 DIAGNOSIS — I658 Occlusion and stenosis of other precerebral arteries: Secondary | ICD-10-CM | POA: Insufficient documentation

## 2014-03-24 NOTE — Progress Notes (Signed)
Carotid duplex complete 

## 2014-04-28 ENCOUNTER — Other Ambulatory Visit: Payer: Self-pay | Admitting: Cardiovascular Disease

## 2014-05-12 ENCOUNTER — Encounter: Payer: Self-pay | Admitting: Cardiovascular Disease

## 2014-05-12 ENCOUNTER — Ambulatory Visit (INDEPENDENT_AMBULATORY_CARE_PROVIDER_SITE_OTHER): Payer: Medicare Other | Admitting: Cardiovascular Disease

## 2014-05-12 VITALS — BP 146/84 | HR 55 | Ht 73.0 in | Wt 209.4 lb

## 2014-05-12 DIAGNOSIS — I6529 Occlusion and stenosis of unspecified carotid artery: Secondary | ICD-10-CM

## 2014-05-12 DIAGNOSIS — I10 Essential (primary) hypertension: Secondary | ICD-10-CM

## 2014-05-12 DIAGNOSIS — R9431 Abnormal electrocardiogram [ECG] [EKG]: Secondary | ICD-10-CM

## 2014-05-12 MED ORDER — LOSARTAN POTASSIUM-HCTZ 100-25 MG PO TABS: 1.0000 | ORAL_TABLET | Freq: Every day | ORAL | Status: DC

## 2014-05-12 NOTE — Progress Notes (Signed)
    HPI:  68 year old gentleman presenting for followup evaluation. The patient was initially seen in 2013 for evaluation of an abnormal EKG. He underwent an echocardiogram and a stress nuclear scan. There were no significant abnormalities noted. He did have a small fixed apical perfusion defect on his nuclear stress test but there was no evidence of reversible ischemia. His gated left ventricular ejection fraction was low at 42%, but an echocardiogram was performed in this showed an estimated LVEF of 50-55%. The patient has been followed for hypertension. He is intolerant to ACE-Inhibitors because of cough. Also treated for hyperlipidemia. Also has been noted to have mild carotid plaque, but stenosis less than 40% bilaterally.   The patient is doing well. He denies chest pain, shortness of breath, edema, or palpitations. He remains physically active. He has read a lot of negative things about statin drugs and wanted to discuss the pros and cons of cholesterol-lowering medication today.   Outpatient Encounter Prescriptions as of 05/12/2014  Medication Sig  . Ascorbic Acid (VITAMIN C) 1000 MG tablet Take 1,000 mg by mouth 2 (two) times daily.  Marland Kitchen aspirin 81 MG tablet Take 81 mg by mouth daily.  Marland Kitchen atorvastatin (LIPITOR) 10 MG tablet Take 1 tablet (10 mg total) by mouth daily.  . clobetasol ointment (TEMOVATE) 0.05 %   . cyanocobalamin 2000 MCG tablet Take 2,000 mcg by mouth daily.  Marland Kitchen losartan-hydrochlorothiazide (HYZAAR) 50-12.5 MG per tablet TAKE 1 TABLET BY MOUTH DAILY  . Multiple Vitamin (MULTIVITAMIN) tablet Take 1 tablet by mouth daily.  Marland Kitchen omeprazole (PRILOSEC) 40 MG capsule Take 1 capsule by mouth 2 (two) times daily.  . vitamin E 400 UNIT capsule Take 400 Units by mouth daily.  . [DISCONTINUED] predniSONE (DELTASONE) 10 MG tablet     Allergies  Allergen Reactions  . Sulfonamide Derivatives     REACTION: Painful eyes    Past Medical History  Diagnosis Date  . Essential hypertension   .  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  ASSESSMENT AND PLAN: 1. Hypertension with suboptimal control. Recommend increase losartan/hydrochlorothiazide 100/25 mg daily. Will repeat a metabolic panel when he returns for labs later this year. He follows a low salt diet.  2. Hypercholesterolemia. Lipids reviewed. We discussed the pros and cons of statin drugs at length. He will continue on atorvastatin 10 mg is I believe his biggest risk is probably cardiovascular related. Followup lipids in December.  For followup I will see him back in one year.  [name] 05/12/2014 8:48 AM

## 2014-05-12 NOTE — Patient Instructions (Signed)
Your physician has recommended you make the following change in your medication: INCREASE Losartan HCT to 100/25mg  take one by mouth daily  Your physician recommends that you return for a FASTING LIPID, LIVER and BMP in December--nothing to eat or drink after midnight, lab opens at 7:30 AM  Your physician wants you to follow-up in:  1 YEAR with Dr Excell Seltzer.  You will receive a reminder letter in the mail two months in advance. If you don't receive a letter, please call our office to schedule the follow-up appointment.

## 2014-05-14 ENCOUNTER — Ambulatory Visit: Payer: Medicare Other | Admitting: Cardiovascular Disease

## 2014-05-23 ENCOUNTER — Other Ambulatory Visit: Payer: Self-pay | Admitting: Cardiovascular Disease

## 2014-11-16 ENCOUNTER — Other Ambulatory Visit (INDEPENDENT_AMBULATORY_CARE_PROVIDER_SITE_OTHER): Payer: Medicare Other | Admitting: *Deleted

## 2014-11-16 DIAGNOSIS — R9431 Abnormal electrocardiogram [ECG] [EKG]: Secondary | ICD-10-CM

## 2014-11-16 DIAGNOSIS — I10 Essential (primary) hypertension: Secondary | ICD-10-CM

## 2014-11-16 LAB — BASIC METABOLIC PANEL
BUN: 17 mg/dL (ref 6–23)
CHLORIDE: 103 meq/L (ref 96–112)
CO2: 30 meq/L (ref 19–32)
CREATININE: 0.9 mg/dL (ref 0.4–1.5)
Calcium: 8.6 mg/dL (ref 8.4–10.5)
GFR: 91.36 mL/min (ref >60.00)
Glucose, Bld: 89 mg/dL (ref 70–99)
Potassium: 4.1 mEq/L (ref 3.5–5.1)
Sodium: 139 mEq/L (ref 135–145)

## 2014-11-16 LAB — LIPID PANEL
Cholesterol: 192 mg/dL (ref 0–200)
HDL: 56.8 mg/dL (ref >39.00)
LDL CALC: 109 mg/dL — AB (ref 0–99)
NONHDL: 135.2
Total CHOL/HDL Ratio: 3
Triglycerides: 131 mg/dL (ref 0.0–149.0)
VLDL: 26.2 mg/dL (ref 0.0–40.0)

## 2014-11-16 LAB — HEPATIC FUNCTION PANEL
ALBUMIN: 3.5 g/dL (ref 3.5–5.2)
ALK PHOS: 45 U/L (ref 39–117)
ALT: 22 U/L (ref 0–53)
AST: 20 U/L (ref 0–37)
Bilirubin, Direct: 0 mg/dL (ref 0.0–0.3)
TOTAL PROTEIN: 6.3 g/dL (ref 6.0–8.3)
Total Bilirubin: 0.5 mg/dL (ref 0.2–1.2)

## 2014-11-22 ENCOUNTER — Telehealth: Payer: Self-pay | Admitting: Cardiovascular Disease

## 2014-11-22 NOTE — Telephone Encounter (Signed)
Notified of lab results.  Sent results to Mady GemmaKristen Kaplan, PA.

## 2014-11-22 NOTE — Telephone Encounter (Signed)
New Msg     Pt returning call about Lab results [phone].

## 2014-12-27 ENCOUNTER — Other Ambulatory Visit: Payer: Self-pay | Admitting: Cardiovascular Disease

## 2015-05-26 ENCOUNTER — Other Ambulatory Visit: Payer: Self-pay | Admitting: Cardiovascular Disease

## 2015-06-30 NOTE — Progress Notes (Signed)
Cardiology Office Note Date:  07/03/2015   ID:  Leroy Lara, Leroy Lara [birth year], MRN [id number]  PCP:  Lilia Argue  Cardiologist:  [name], MD    Chief Complaint  Patient presents with  . Follow-up    hypertension, benign     History of Present Illness: Leroy Lara is a 69 y.o. male who presents for follow-up evaluation. The patient was initially seen in 2013 for evaluation of an abnormal EKG. He underwent an echocardiogram and a stress nuclear scan. There were no significant abnormalities noted. He did have a small fixed apical perfusion defect on his nuclear stress test but there was no evidence of reversible ischemia. His gated left ventricular ejection fraction was low at 42%, but an echocardiogram was performed in this showed an estimated LVEF of 50-55%. The patient has been followed for hypertension. He is intolerant to ACE-Inhibitors because of cough. He is also treated for hyperlipidemia. He has been noted to have mild carotid plaque, but stenosis less than 40% bilaterally.   He generally feels well. Primary complaint is 'aches and pains.' No CP, dyspnea, or palpitations. Admits to occasional leg swelling, mainly 'sock lines.' No other complaints.  Past Medical History  Diagnosis Date  . Essential hypertension   . Gastroesophageal reflux disease     History reviewed. No pertinent past surgical history.  Current Outpatient Prescriptions  Medication Sig Dispense Refill  . Ascorbic Acid (VITAMIN C) 1000 MG tablet Take 1,000 mg by mouth 2 (two) times daily.    Marland Kitchen aspirin 81 MG tablet Take 81 mg by mouth daily.    Marland Kitchen atorvastatin (LIPITOR) 10 MG tablet Take 1 tablet (10 mg total) by mouth daily. 30 tablet 11  . losartan-hydrochlorothiazide (HYZAAR) 100-25 MG per tablet Take 1 tablet by mouth daily. 30 tablet 11  . Multiple Vitamin (MULTIVITAMIN) tablet Take 1 tablet by mouth daily.    Marland Kitchen omeprazole (PRILOSEC) 40 MG capsule Take 1 capsule by mouth 2 (two) times  daily.    . vitamin E 400 UNIT capsule Take 400 Units by mouth daily.     No current facility-administered medications for this visit.    Allergies:   Sulfonamide derivatives   Social History:  The patient  reports that he has quit smoking. He does not have any smokeless tobacco history on file.   Family History:  The patient's  family history includes Cancer - Lung in his father; Coronary artery disease (age of onset: 78) in his mother; Heart failure in his sister; Lymphoma (age of onset: 19) in his brother; Pulmonary fibrosis in his brother.    ROS:  Please see the history of present illness.  Otherwise, review of systems is positive for leg swelling, vision changes, cough, back pain, muscle pain, rash, easy bruising, leg pain.  All other systems are reviewed and negative.    PHYSICAL EXAM: VS:  BP 130/72 mmHg  Pulse 58  Ht  (1.854 m)  Wt 201 lb 6.4 oz (91.354 kg)  BMI 26.58 kg/m2 , BMI Body mass index is 26.58 kg/(m^2). GEN: Well nourished, well developed, in no acute distress HEENT: normal Neck: no JVD, no masses. Soft bilateral bruits Cardiac: RRR without murmur or gallop                Respiratory:  clear to auscultation bilaterally, normal work of breathing GI: soft, nontender, nondistended, + BS MS: no deformity or atrophy Ext: no pretibial edema, pedal pulses 2+= bilaterally Skin: warm and dry, no  rash Neuro:  Strength and sensation are intact Psych: euthymic mood, full affect  EKG:  EKG is ordered today. The ekg ordered today shows NSR 58 bpm, age-indeterminate septal infarct, nonspecific ST-T abnormality. No significant change from previous tracings.  Recent Labs: 11/16/2014: ALT 22; BUN 17; Creatinine, Ser 0.9; Potassium 4.1; Sodium 139   Lipid Panel     Component Value Date/Time   CHOL 192 11/16/2014 0732   TRIG 131.0 11/16/2014 0732   HDL 56.80 11/16/2014 0732   CHOLHDL 3 11/16/2014 0732   VLDL 26.2 11/16/2014 0732   LDLCALC 109* 11/16/2014 0732    LDLDIRECT 154.8 08/26/2013 0739      Wt Readings from Last 3 Encounters:  07/01/15 201 lb 6.4 oz (91.354 kg)  05/12/14 209 lb 6.4 oz (94.983 kg)  04/24/13 204 lb (92.534 kg)     Cardiac Studies Reviewed: Carotid duplex - 2015 study - < 40% stenosis bilaterally  ASSESSMENT AND PLAN: 1.  Essential HTN: BP well-controlled on current Rx. Continue same without changes. Repeat labs 6 months.  2. Hyperlipidemia: statin-dose has been limited by side effects. Reviewed most recent lipids from December 2015 labs with total chol 192, HDL 57, and LDL 109. Will continue same Rx and work on lifestyle modification.  3. Carotid stenosis, mild: repeat duplex in one year prior to his routine FU visit. On appropriate Rx with ASA, statin drug.  Current medicines are reviewed with the patient today.  The patient does not have concerns regarding medicines.  Labs/ tests ordered today include:   Orders Placed This Encounter  Procedures  . Lipid panel  . Hepatic function panel  . Basic Metabolic Panel (BMET)  . CBC  . EKG 12-Lead    Disposition:   FU one year with carotid duplex prior to that visit.  Enzo Bi, MD  07/03/2015 11:00 PM    [facility] [address] [place], [place], [place]  [address] Phone: [phone]; Fax: [phone]

## 2015-07-01 ENCOUNTER — Ambulatory Visit (INDEPENDENT_AMBULATORY_CARE_PROVIDER_SITE_OTHER): Payer: Medicare Other | Admitting: Cardiovascular Disease

## 2015-07-01 ENCOUNTER — Encounter: Payer: Self-pay | Admitting: Cardiovascular Disease

## 2015-07-01 VITALS — BP 130/72 | HR 58 | Ht 73.0 in | Wt 201.4 lb

## 2015-07-01 DIAGNOSIS — I10 Essential (primary) hypertension: Secondary | ICD-10-CM | POA: Diagnosis not present

## 2015-07-01 DIAGNOSIS — R9431 Abnormal electrocardiogram [ECG] [EKG]: Secondary | ICD-10-CM

## 2015-07-01 DIAGNOSIS — E785 Hyperlipidemia, unspecified: Secondary | ICD-10-CM

## 2015-07-01 MED ORDER — LOSARTAN POTASSIUM-HCTZ 100-25 MG PO TABS: 1.0000 | ORAL_TABLET | Freq: Every day | ORAL | Status: DC

## 2015-07-01 MED ORDER — ATORVASTATIN CALCIUM 10 MG PO TABS: 10.0000 mg | ORAL_TABLET | Freq: Every day | ORAL | Status: DC

## 2015-07-01 NOTE — Patient Instructions (Signed)
Medication Instructions:  Your physician recommends that you continue on your current medications as directed. Please refer to the Current Medication list given to you today.  Labwork: Your physician recommends that you return for a FASTING LIPID, LIVER, BMP and CBC in December--nothing to eat or drink after midnight, lab opens at 7:30 AM.   Testing/Procedures: No new orders.   Follow-Up: Your physician wants you to follow-up in: 1 YEAR with Dr Excell Seltzer.  You will receive a reminder letter in the mail two months in advance. If you don't receive a letter, please call our office to schedule the follow-up appointment.   Any Other Special Instructions Will Be Listed Below (If Applicable).

## 2015-07-03 ENCOUNTER — Encounter: Payer: Self-pay | Admitting: Cardiovascular Disease

## 2015-11-23 ENCOUNTER — Other Ambulatory Visit: Payer: Medicare Other

## 2016-01-02 ENCOUNTER — Telehealth: Payer: Self-pay | Admitting: Cardiovascular Disease

## 2016-01-02 DIAGNOSIS — I10 Essential (primary) hypertension: Secondary | ICD-10-CM

## 2016-01-02 DIAGNOSIS — E785 Hyperlipidemia, unspecified: Secondary | ICD-10-CM

## 2016-01-02 NOTE — Telephone Encounter (Signed)
New message     Patient calling wants to set up blood work.

## 2016-01-02 NOTE — Telephone Encounter (Signed)
I spoke with the pt and scheduled him for lab work on 01/09/15.

## 2016-01-10 ENCOUNTER — Other Ambulatory Visit (INDEPENDENT_AMBULATORY_CARE_PROVIDER_SITE_OTHER): Payer: Medicare Other | Admitting: *Deleted

## 2016-01-10 DIAGNOSIS — I10 Essential (primary) hypertension: Secondary | ICD-10-CM

## 2016-01-10 DIAGNOSIS — E785 Hyperlipidemia, unspecified: Secondary | ICD-10-CM | POA: Diagnosis not present

## 2016-01-10 LAB — CBC
HCT: 42 % (ref 39.0–52.0)
HEMOGLOBIN: 14.5 g/dL (ref 13.0–17.0)
MCH: 32.4 pg (ref 26.0–34.0)
MCHC: 34.5 g/dL (ref 30.0–36.0)
MCV: 94 fL (ref 78.0–100.0)
MPV: 10.7 fL (ref 8.6–12.4)
Platelets: 260 10*3/uL (ref 150–400)
RBC: 4.47 MIL/uL (ref 4.22–5.81)
RDW: 12.6 % (ref 11.5–15.5)
WBC: 6.3 10*3/uL (ref 4.0–10.5)

## 2016-01-10 LAB — COMPREHENSIVE METABOLIC PANEL
ALBUMIN: 3.8 g/dL (ref 3.6–5.1)
ALK PHOS: 53 U/L (ref 40–115)
ALT: 26 U/L (ref 9–46)
AST: 23 U/L (ref 10–35)
BUN: 18 mg/dL (ref 7–25)
CO2: 28 mmol/L (ref 20–31)
CREATININE: 0.93 mg/dL (ref 0.70–1.25)
Calcium: 9.3 mg/dL (ref 8.6–10.3)
Chloride: 98 mmol/L (ref 98–110)
Glucose, Bld: 95 mg/dL (ref 65–99)
Potassium: 4.2 mmol/L (ref 3.5–5.3)
SODIUM: 138 mmol/L (ref 135–146)
TOTAL PROTEIN: 6.9 g/dL (ref 6.1–8.1)
Total Bilirubin: 0.6 mg/dL (ref 0.2–1.2)

## 2016-01-10 LAB — LIPID PANEL
Cholesterol: 175 mg/dL (ref 125–200)
HDL: 59 mg/dL (ref >=40)
LDL Cholesterol: 97 mg/dL (ref <130)
Total CHOL/HDL Ratio: 3 Ratio (ref <=5.0)
Triglycerides: 95 mg/dL (ref <150)
VLDL: 19 mg/dL (ref <30)

## 2016-07-24 ENCOUNTER — Other Ambulatory Visit: Payer: Self-pay | Admitting: Cardiovascular Disease

## 2016-07-24 DIAGNOSIS — R9431 Abnormal electrocardiogram [ECG] [EKG]: Secondary | ICD-10-CM

## 2016-07-24 DIAGNOSIS — E785 Hyperlipidemia, unspecified: Secondary | ICD-10-CM

## 2016-07-24 DIAGNOSIS — I10 Essential (primary) hypertension: Secondary | ICD-10-CM

## 2016-08-20 ENCOUNTER — Ambulatory Visit (INDEPENDENT_AMBULATORY_CARE_PROVIDER_SITE_OTHER): Payer: Medicare Other | Admitting: Cardiovascular Disease

## 2016-08-20 ENCOUNTER — Encounter (INDEPENDENT_AMBULATORY_CARE_PROVIDER_SITE_OTHER): Payer: Self-pay

## 2016-08-20 ENCOUNTER — Encounter: Payer: Self-pay | Admitting: Cardiovascular Disease

## 2016-08-20 VITALS — BP 140/80 | HR 50 | Ht 72.0 in | Wt 206.2 lb

## 2016-08-20 DIAGNOSIS — R9431 Abnormal electrocardiogram [ECG] [EKG]: Secondary | ICD-10-CM | POA: Diagnosis not present

## 2016-08-20 DIAGNOSIS — I10 Essential (primary) hypertension: Secondary | ICD-10-CM

## 2016-08-20 DIAGNOSIS — R0609 Other forms of dyspnea: Secondary | ICD-10-CM

## 2016-08-20 DIAGNOSIS — E785 Hyperlipidemia, unspecified: Secondary | ICD-10-CM

## 2016-08-20 DIAGNOSIS — I447 Left bundle-branch block, unspecified: Secondary | ICD-10-CM

## 2016-08-20 MED ORDER — ATORVASTATIN CALCIUM 10 MG PO TABS: 10.0000 mg | ORAL_TABLET | Freq: Every day | ORAL | 11 refills | Status: DC

## 2016-08-20 MED ORDER — LOSARTAN POTASSIUM-HCTZ 100-25 MG PO TABS: 1.0000 | ORAL_TABLET | Freq: Every day | ORAL | 11 refills | Status: DC

## 2016-08-20 NOTE — Patient Instructions (Signed)

## 2016-08-20 NOTE — Progress Notes (Signed)
Cardiology Office Note Date:  08/21/2016   ID:  Leroy Lara, DOB [birth year], MRN [id number]  PCP:  Lilia Argue  Cardiologist:  [name], MD    Chief Complaint  Patient presents with  . carotid stenosis   History of Present Illness: Leroy Lara is a 70 y.o. male who presents for follow-up evaluation. The patient was initially seen in 2013 for evaluation of an abnormal EKG. He underwent an echocardiogram and a stress nuclear scan. There were no significant abnormalities noted. He did have a small fixed apical perfusion defect on his nuclear stress test but there was no evidence of reversible ischemia. His gated left ventricular ejection fraction was low at 42%, but an echocardiogram was performed in this showed an estimated LVEF of 50-55%. The patient has been followed for hypertension. He is intolerant to ACE-Inhibitors because of cough. He is also treated for hyperlipidemia. He has been noted to have mild carotid plaque, but stenosis less than 40% bilaterally.   Overall the patient is doing fairly well. He complains of aches and pains and continues to do hard physical work. He denies chest pain or pressure. No headache, heart palpitation, edema, orthopnea, or PND. He does admit to mild dyspnea with exertion when climbing stairs. No cough or other complaints.  Past Medical History:  Diagnosis Date  . Essential hypertension   . Gastroesophageal reflux disease     No past surgical history on file.  Current Outpatient Prescriptions  Medication Sig Dispense Refill  . aspirin 81 MG tablet Take 81 mg by mouth daily.    Marland Kitchen atorvastatin (LIPITOR) 10 MG tablet Take 1 tablet (10 mg total) by mouth daily. 30 tablet 11  . Cyanocobalamin (VITAMIN B 12 PO) Take 1 Dose by mouth daily.    Marland Kitchen losartan-hydrochlorothiazide (HYZAAR) 100-25 MG tablet Take 1 tablet by mouth daily. 30 tablet 11  . Omega-3 Fatty Acids (FISH OIL PO) Take 1 Dose by mouth daily.    Marland Kitchen omeprazole (PRILOSEC)  40 MG capsule Take 1 capsule by mouth 2 (two) times daily.     No current facility-administered medications for this visit.     Allergies:   Sulfamethoxazole and Sulfonamide derivatives   Social History:  The patient  reports that he has quit smoking. He does not have any smokeless tobacco history on file.   Family History:  The patient's family history includes Cancer - Lung in his father; Coronary artery disease (age of onset: 29) in his mother; Heart failure in his sister; Lymphoma (age of onset: 80) in his brother; Pulmonary fibrosis in his brother.    ROS:  Please see the history of present illness.  Otherwise, review of systems is positive for Hearing loss, vision changes, cough, back pain, muscle pain, easy bruising, leg pain, snoring.  All other systems are reviewed and negative.    PHYSICAL EXAM: VS:  BP 140/80   Pulse (!) 50   Ht 6' (1.829 m)   Wt 93.5 kg (206 lb 3.2 oz)   BMI 27.97 kg/m  , BMI Body mass index is 27.97 kg/m. GEN: Well nourished, well developed, in no acute distress  HEENT: normal  Neck: no JVD, no masses. No carotid bruits Cardiac: RRR without murmur or gallop                Respiratory:  clear to auscultation bilaterally, normal work of breathing GI: soft, nontender, nondistended, + BS MS: no deformity or atrophy  Ext: no pretibial edema, pedal pulses  2+= bilaterally Skin: warm and dry, no rash Neuro:  Strength and sensation are intact Psych: euthymic mood, full affect  EKG:  EKG is ordered today. The ekg ordered today shows sinus bradycardia, first-degree AV block, left bundle branch block  Recent Labs: 01/10/2016: ALT 26; BUN 18; Creat 0.93; Hemoglobin 14.5; Platelets 260; Potassium 4.2; Sodium 138   Lipid Panel     Component Value Date/Time   CHOL 175 01/10/2016 0759   TRIG 95 01/10/2016 0759   HDL 59 01/10/2016 0759   CHOLHDL 3.0 01/10/2016 0759   VLDL 19 01/10/2016 0759   LDLCALC 97 01/10/2016 0759   LDLDIRECT 154.8 08/26/2013 0739       Wt Readings from Last 3 Encounters:  08/20/16 93.5 kg (206 lb 3.2 oz)  07/01/15 91.4 kg (201 lb 6.4 oz)  05/12/14 95 kg (209 lb 6.4 oz)     Cardiac Studies Reviewed: Echocardiogram 02/26/2012: Study Conclusions  - Left ventricle: The cavity size was normal. Wall thickness was increased in a pattern of mild LVH. Systolic function was normal. The estimated ejection fraction was in the range of 50% to 55%. Wall motion was normal; there were no regional wall motion abnormalities. Left ventricular diastolic function parameters were normal. - Left atrium: The atrium was mildly dilated. - Atrial septum: No defect or patent foramen ovale was identified. - Pulmonary arteries: PA peak pressure: 37mm Hg (S).  Carotid duplex scan 03/24/2014: Mild bilateral carotid stenosis less than 40%  ASSESSMENT AND PLAN: 1.  Essential hypertension: BP controlled on current Rx.   2. Hyperlipidemia: Last labs reviewed January 2017 with a total cholesterol 175, triglycerides 95, HDL 59, LDL 97.  3. Mild carotid stenosis: No progression of disease seen at the time of his last carotid ultrasound. Clinical follow-up from here forward.  4. LBBB, new. The patient had some conduction disease seen at time of his last EKG. 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 Considering his exertional dyspnea and low normal LV systolic function at the time of his last echo in 2013, I have recommended a repeat echocardiogram to evaluate for cardiomyopathy and/or valvular heart disease.  Current medicines are reviewed with the patient today.  The patient does not have concerns regarding medicines.  Labs/ tests ordered today include:   Orders Placed This Encounter  Procedures  . EKG 12-Lead  . ECHOCARDIOGRAM COMPLETE   Disposition:   FU one year. Overall, I think Leroy Lara is doing well and will continue his current medications unless we see any significant abnormality on his echo study.    Leroy BiSigned, Jazzlyn Huizenga, MD  08/21/2016 10:21 PM    [facility] [address], [place], [place]  [address] Phone: [phone]; Fax: [phone]

## 2016-09-04 ENCOUNTER — Ambulatory Visit (HOSPITAL_COMMUNITY): Payer: Medicare Other | Attending: Cardiology

## 2016-09-04 ENCOUNTER — Other Ambulatory Visit: Payer: Self-pay

## 2016-09-04 DIAGNOSIS — R0609 Other forms of dyspnea: Secondary | ICD-10-CM | POA: Insufficient documentation

## 2016-09-04 DIAGNOSIS — I119 Hypertensive heart disease without heart failure: Secondary | ICD-10-CM | POA: Insufficient documentation

## 2016-09-04 DIAGNOSIS — I447 Left bundle-branch block, unspecified: Secondary | ICD-10-CM

## 2016-09-04 DIAGNOSIS — R0600 Dyspnea, unspecified: Secondary | ICD-10-CM | POA: Diagnosis present

## 2016-09-04 DIAGNOSIS — I340 Nonrheumatic mitral (valve) insufficiency: Secondary | ICD-10-CM | POA: Diagnosis not present

## 2016-09-04 DIAGNOSIS — I071 Rheumatic tricuspid insufficiency: Secondary | ICD-10-CM | POA: Insufficient documentation

## 2016-09-04 DIAGNOSIS — E785 Hyperlipidemia, unspecified: Secondary | ICD-10-CM | POA: Diagnosis not present

## 2016-09-04 DIAGNOSIS — I4891 Unspecified atrial fibrillation: Secondary | ICD-10-CM | POA: Insufficient documentation

## 2016-09-04 DIAGNOSIS — Z87891 Personal history of nicotine dependence: Secondary | ICD-10-CM | POA: Diagnosis not present

## 2016-09-04 DIAGNOSIS — Z8249 Family history of ischemic heart disease and other diseases of the circulatory system: Secondary | ICD-10-CM | POA: Diagnosis not present

## 2016-11-14 ENCOUNTER — Telehealth: Payer: Self-pay | Admitting: Cardiovascular Disease

## 2016-11-14 NOTE — Telephone Encounter (Signed)
F/u Message  Pt call returning RN call. Pt call to confirm appt for EKG on Friday at 10am. Please call back if needed

## 2016-11-14 NOTE — Telephone Encounter (Signed)
Nurse room appointment has been scheduled for 11/16/16.

## 2016-11-16 ENCOUNTER — Ambulatory Visit: Payer: Medicare Other | Admitting: Cardiovascular Disease

## 2016-11-16 ENCOUNTER — Ambulatory Visit (INDEPENDENT_AMBULATORY_CARE_PROVIDER_SITE_OTHER): Payer: Medicare Other | Admitting: Cardiovascular Disease

## 2016-11-16 ENCOUNTER — Encounter: Payer: Self-pay | Admitting: Cardiovascular Disease

## 2016-11-16 VITALS — BP 138/72 | HR 57 | Wt 206.0 lb

## 2016-11-16 DIAGNOSIS — I4891 Unspecified atrial fibrillation: Secondary | ICD-10-CM | POA: Diagnosis not present

## 2016-11-16 LAB — COMPREHENSIVE METABOLIC PANEL
ALK PHOS: 54 U/L (ref 40–115)
ALT: 20 U/L (ref 9–46)
AST: 21 U/L (ref 10–35)
Albumin: 3.9 g/dL (ref 3.6–5.1)
BILIRUBIN TOTAL: 0.6 mg/dL (ref 0.2–1.2)
BUN: 16 mg/dL (ref 7–25)
CALCIUM: 8.9 mg/dL (ref 8.6–10.3)
CO2: 30 mmol/L (ref 20–31)
Chloride: 101 mmol/L (ref 98–110)
Creat: 1.01 mg/dL (ref 0.70–1.18)
GLUCOSE: 89 mg/dL (ref 65–99)
Potassium: 4.5 mmol/L (ref 3.5–5.3)
Sodium: 138 mmol/L (ref 135–146)
Total Protein: 6.8 g/dL (ref 6.1–8.1)

## 2016-11-16 LAB — CBC
HEMATOCRIT: 43.9 % (ref 38.5–50.0)
HEMOGLOBIN: 14.7 g/dL (ref 13.2–17.1)
MCH: 32.3 pg (ref 27.0–33.0)
MCHC: 33.5 g/dL (ref 32.0–36.0)
MCV: 96.5 fL (ref 80.0–100.0)
MPV: 10.7 fL (ref 7.5–12.5)
PLATELETS: 291 10*3/uL (ref 140–400)
RBC: 4.55 MIL/uL (ref 4.20–5.80)
RDW: 12.5 % (ref 11.0–15.0)
WBC: 7.5 10*3/uL (ref 3.8–10.8)

## 2016-11-16 LAB — TSH: TSH: 0.78 mIU/L (ref 0.40–4.50)

## 2016-11-16 MED ORDER — APIXABAN 5 MG PO TABS: 5.0000 mg | ORAL_TABLET | Freq: Two times a day (BID) | ORAL | 11 refills | Status: DC

## 2016-11-16 NOTE — Progress Notes (Signed)
Cardiology Office Note Date:  11/16/2016   ID:  VICTORIO CREEDEN, DOB [birth year], MRN [id number]  PCP:  Tula Nakayama  Cardiologist:  Sherren Mocha, MD    Chief Complaint  Patient presents with  . Atrial Fibrillation   History of Present Illness: Leroy Lara is a 70 y.o. male who presents for Follow-up evaluation.  The patient was initially seen in 2013 for evaluation of an abnormal EKG. He underwent an echocardiogram and a stress nuclear scan. There were no significant abnormalities noted. He did have a small fixed apical perfusion defect on his nuclear stress test but there was no evidence of reversible ischemia. His gated left ventricular ejection fraction was low at 42%, but an echocardiogram was performed in this showed an estimated LVEF of 50-55%. The patient has been followed for hypertension. He is intolerant to ACE-Inhibitors because of cough. He is also treated for hyperlipidemia. He has been noted to have mild carotid plaque, but stenosis less than 40% bilaterally.   When he was last seen in September 2017, he was diagnosed with a new left bundle branch block.  An echocardiogram was done that demonstrated low normal LV systolic function with ejection fraction of 50-55% and moderate concentric hypertrophy. There was no significant valvular disease. PA pressure was normal by echo assessment. However, during the echocardiogram the patient was noted to have an irregular heart rate and finding suggestive of atrial fibrillation. He returned today for a nurse visit and an EKG was performed. This demonstrates atrial fibrillation with a heart rate of 57 bpm and left bundle branch block. He is added on for an office visit with newly diagnosed atrial fib.  The patient is doing fine. He has no complaints. His symptoms are unchanged from his recent visit in September. He has mild exertional dyspnea but no other complaints. He specifically denies chest pain, heart palpitations,  lightheadedness, leg swelling, orthopnea, or PND.   Past Medical History:  Diagnosis Date  . Essential hypertension   . Gastroesophageal reflux disease     No past surgical history on file.  Current Outpatient Prescriptions  Medication Sig Dispense Refill  . atorvastatin (LIPITOR) 10 MG tablet Take 1 tablet (10 mg total) by mouth daily. 30 tablet 11  . Cyanocobalamin (VITAMIN B 12 PO) Take 1 Dose by mouth daily.    Marland Kitchen losartan-hydrochlorothiazide (HYZAAR) 100-25 MG tablet Take 1 tablet by mouth daily. 30 tablet 11  . Omega-3 Fatty Acids (FISH OIL PO) Take 1 Dose by mouth daily.    Marland Kitchen omeprazole (PRILOSEC) 40 MG capsule Take 1 capsule by mouth daily.     Marland Kitchen apixaban (ELIQUIS) 5 MG TABS tablet Take 1 tablet (5 mg total) by mouth 2 (two) times daily. 60 tablet 11   No current facility-administered medications for this visit.     Allergies:   Sulfamethoxazole and Sulfonamide derivatives   Social History:  The patient  reports that he has quit smoking. He does not have any smokeless tobacco history on file.   Family History:  The patient's  family history includes Cancer - Lung in his father; Coronary artery disease (age of onset: 48) in his mother; Heart failure in his sister; Lymphoma (age of onset: 18) in his brother; Pulmonary fibrosis in his brother.   ROS:  Please see the history of present illness.  All other systems are reviewed and negative.   PHYSICAL EXAM: VS:  BP 138/72 (BP Location: Right Arm, Patient Position: Sitting)   Pulse (!) 57  Wt 206 lb (93.4 kg)   BMI 27.94 kg/m  , BMI Body mass index is 27.94 kg/m. GEN: Well nourished, well developed, in no acute distress  HEENT: normal  Neck: no JVD, no masses. No carotid bruits Cardiac: irregularly irregular without murmur or gallop        Respiratory:  clear to auscultation bilaterally, normal work of breathing GI: soft, nontender, nondistended, + BS MS: no deformity or atrophy  Ext: no pretibial edema, pedal pulses  2+= bilaterally Skin: warm and dry, no rash Neuro:  Strength and sensation are intact Psych: euthymic mood, full affect  EKG:  EKG is ordered today. The ekg ordered today shows atrial fibrillation 57 bpm, LBBB  Recent Labs: 01/10/2016: ALT 26; BUN 18; Creat 0.93; Hemoglobin 14.5; Platelets 260; Potassium 4.2; Sodium 138   Lipid Panel     Component Value Date/Time   CHOL 175 01/10/2016 0759   TRIG 95 01/10/2016 0759   HDL 59 01/10/2016 0759   CHOLHDL 3.0 01/10/2016 0759   VLDL 19 01/10/2016 0759   LDLCALC 97 01/10/2016 0759   LDLDIRECT 154.8 08/26/2013 0739      Wt Readings from Last 3 Encounters:  11/16/16 206 lb (93.4 kg)  08/20/16 206 lb 3.2 oz (93.5 kg)  07/01/15 201 lb 6.4 oz (91.4 kg)     Cardiac Studies Reviewed: 2D Echo 09-04-2016: ------------------------------------------------------------------- Study Conclusions  - Left ventricle: The cavity size was normal. There was moderate   concentric hypertrophy. Systolic function was normal. The   estimated ejection fraction was in the range of 50% to 55%.   Diffuse hypokinesis. The study was not technically sufficient to   allow evaluation of LV diastolic dysfunction due to atrial   fibrillation. - Ventricular septum: Septal motion showed paradox. - Aortic valve: Trileaflet; normal thickness leaflets.   Transvalvular velocity was within the normal range. There was no   stenosis. There was no regurgitation. - Aortic root: The aortic root was normal in size. - Mitral valve: There was mild regurgitation. - Left atrium: The atrium was moderately dilated. - Right ventricle: The cavity size was normal. Wall thickness was   normal. Systolic function was normal. - Right atrium: The atrium was normal in size. - Tricuspid valve: There was trivial regurgitation. - Pulmonary arteries: Systolic pressure was within the normal   range. - Inferior vena cava: The vessel was normal in size. The   respirophasic diameter changes  were in the normal range (= 50%),   consistent with normal central venous pressure. - Pericardium, extracardiac: There was no pericardial effusion.  ASSESSMENT AND PLAN: This 70 year old gentleman with newly diagnosed atrial fibrillation. His CHADS-Vasc score equals 2. He was in sinus rhythm when I saw him 08/20/2016. Sometime between that office visit and the time of his echocardiogram he went into atrial fibrillation. He has not appreciated any change in his symptoms. We had a lengthy discussion today regarding the implications of atrial fibrillation. He understands that oral anticoagulation is recommended in patients who have a CHADS-Vasc score of 2. We discussed options of warfarin versus direct oral anticoagulant drugs. We both favor initiation of a direct oral anticoagulant drug. He is prescribed Eliquis 5 mg twice daily. Baseline labs are drawn today including a CBC, metabolic panel, and thyroid function tests. He was advised about potential bleeding risks. He will discontinue aspirin. We discussed rate control versus rhythm control strategies, and considering his asymptomatic status will pursue rate control at this time. He will return for follow-up in 6  months.  Current medicines are reviewed with the patient today.  The patient does not have concerns regarding medicines.  Labs/ tests ordered today include:   Orders Placed This Encounter  Procedures  . CBC  . TSH  . Comp Met (CMET)  . EKG 12-Lead    Disposition:   FU 6 months  Signed, Sherren Mocha, MD  11/16/2016 1:35 PM    [facility] [place], [place], [place]  [address] Phone: [phone]; Fax: [phone]

## 2016-11-16 NOTE — Progress Notes (Signed)
Patient arrived for nurse visit EKG. EKG performed and given to Dr. Excell Seltzerooper for review. Dr. Excell Seltzerooper requests for patient to be added onto the schedule since patient is in in AFib.

## 2016-11-16 NOTE — Progress Notes (Unsigned)
Cardiology Office Note Date:  11/16/2016   ID:  Leroy Lara, DOB Nov 11, 1946, MRN [id number]  PCP:  Lilia ArgueKAPLAN,KRISTEN, PA-C  Cardiologist:  Tonny Bollmanooper, Domonic Kimball, MD    No chief complaint on file.    History of Present Illness: Leroy Lara is a 70 y.o. male who presents for ***   Past Medical History:  Diagnosis Date  . Essential hypertension   . Gastroesophageal reflux disease     No past surgical history on file.  Current Outpatient Prescriptions  Medication Sig Dispense Refill  . aspirin 81 MG tablet Take 81 mg by mouth daily.    Marland Kitchen. atorvastatin (LIPITOR) 10 MG tablet Take 1 tablet (10 mg total) by mouth daily. 30 tablet 11  . Cyanocobalamin (VITAMIN B 12 PO) Take 1 Dose by mouth daily.    Marland Kitchen. losartan-hydrochlorothiazide (HYZAAR) 100-25 MG tablet Take 1 tablet by mouth daily. 30 tablet 11  . Omega-3 Fatty Acids (FISH OIL PO) Take 1 Dose by mouth daily.    Marland Kitchen. omeprazole (PRILOSEC) 40 MG capsule Take 1 capsule by mouth daily.      No current facility-administered medications for this visit.     Allergies:   Sulfamethoxazole and Sulfonamide derivatives   Social History:  The patient  reports that he has quit smoking. He does not have any smokeless tobacco history on file.   Family History:  The patient's *** family history includes Cancer - Lung in his father; Coronary artery disease (age of onset: 8570) in his mother; Heart failure in his sister; Lymphoma (age of onset: 1537) in his brother; Pulmonary fibrosis in his brother.    ROS:  Please see the history of present illness.  Otherwise, review of systems is positive for ***.  All other systems are reviewed and negative.    PHYSICAL EXAM: VS:  There were no vitals taken for this visit. , BMI There is no height or weight on file to calculate BMI. GEN: Well nourished, well developed, in no acute distress  HEENT: normal  Neck: no JVD, no masses. No carotid bruits*** Cardiac: ***RRR without murmur or gallop                  Respiratory:  clear to auscultation bilaterally, normal work of breathing GI: soft, nontender, nondistended, + BS MS: no deformity or atrophy  Ext: no pretibial edema, ***pedal pulses 2+= bilaterally Skin: warm and dry, no rash Neuro:  Strength and sensation are intact Psych: euthymic mood, full affect  EKG:  EKG {ACTION; IS/IS [id number]}OT:[id number]} ordered today. The ekg ordered today shows ***  Recent Labs: 01/10/2016: ALT 26; BUN 18; Creat 0.93; Hemoglobin 14.5; Platelets 260; Potassium 4.2; Sodium 138   Lipid Panel     Component Value Date/Time   CHOL 175 01/10/2016 0759   TRIG 95 01/10/2016 0759   HDL 59 01/10/2016 0759   CHOLHDL 3.0 01/10/2016 0759   VLDL 19 01/10/2016 0759   LDLCALC 97 01/10/2016 0759   LDLDIRECT 154.8 08/26/2013 0739      Wt Readings from Last 3 Encounters:  11/16/16 206 lb (93.4 kg)  08/20/16 206 lb 3.2 oz (93.5 kg)  07/01/15 201 lb 6.4 oz (91.4 kg)     Cardiac Studies Reviewed: ***  ASSESSMENT AND PLAN: 1.  ***  This patients CHA2DS2-VASc Score and unadjusted Ischemic Stroke Rate (% per year) is equal to 2.2 % stroke rate/year from a score of 2  Above score calculated as 1 point each if present [CHF, HTN, DM, Vascular=MI/PAD/Aortic  Plaque, Age if 4865-74, or Male] Above score calculated as 2 points each if present [Age > 75, or Stroke/TIA/TE]   Current medicines are reviewed with the patient today.  The patient {ACTIONS; HAS/DOES NOT HAVE:19233} concerns regarding medicines.  Labs/ tests ordered today include: *** No orders of the defined types were placed in this encounter.   Disposition:   FU ***  Signed, Tonny Bollmanooper, Kerrick Miler, MD  11/16/2016 10:46 AM    [facility] [address], [place], [place]  [address] Phone: [phone]; Fax: [phone]

## 2016-11-16 NOTE — Patient Instructions (Addendum)
Medication Instructions:  Your physician has recommended you make the following change in your medication:  1. START Eliquis 5mg  take one tablet by mouth twice a day 2. STOP Aspirin when you start Eliquis  Labwork: Your physician recommends that you have lab work today: CBC, CMP and TSH  Testing/Procedures: No new orders.   Follow-Up: Your physician wants you to follow-up in: 6 MONTHS with Dr Excell Seltzerooper.  You will receive a reminder letter in the mail two months in advance. If you don't receive a letter, please call our office to schedule the follow-up appointment.   Any Other Special Instructions Will Be Listed Below (If Applicable).     If you need a refill on your cardiac medications before your next appointment, please call your pharmacy.

## 2017-01-29 ENCOUNTER — Ambulatory Visit (INDEPENDENT_AMBULATORY_CARE_PROVIDER_SITE_OTHER): Payer: Medicare Other

## 2017-01-29 ENCOUNTER — Ambulatory Visit (INDEPENDENT_AMBULATORY_CARE_PROVIDER_SITE_OTHER): Payer: Medicare Other | Admitting: Orthopedic Surgery

## 2017-01-29 ENCOUNTER — Encounter (INDEPENDENT_AMBULATORY_CARE_PROVIDER_SITE_OTHER): Payer: Self-pay | Admitting: Orthopedic Surgery

## 2017-01-29 ENCOUNTER — Telehealth: Payer: Self-pay

## 2017-01-29 DIAGNOSIS — I714 Abdominal aortic aneurysm, without rupture, unspecified: Secondary | ICD-10-CM

## 2017-01-29 DIAGNOSIS — M25551 Pain in right hip: Secondary | ICD-10-CM

## 2017-01-29 DIAGNOSIS — M5410 Radiculopathy, site unspecified: Secondary | ICD-10-CM

## 2017-01-29 DIAGNOSIS — M25552 Pain in left hip: Secondary | ICD-10-CM

## 2017-01-29 DIAGNOSIS — R109 Unspecified abdominal pain: Secondary | ICD-10-CM

## 2017-01-29 NOTE — Progress Notes (Signed)
Office Visit Note   Patient: Leroy LaudJoseph T Rosemond           Date of Birth: [birth year]           MRN: [id number] Visit Date: 01/29/2017 Requested by: Richmond CampbellKristen W. Kaplan, PA-C [address] [place], [place] [address] PCP: Lilia ArgueKAPLAN,KRISTEN, PA-C  Subjective: Chief Complaint  Patient presents with  . Left Hip - Pain  . Right Hip - Pain    Patient comes in today complaining of bilateral crest and groin pain, right greater than left.  He has a history of right sciatica, and this pain is different.  He saw the urologist a few months ago with concerns, and he does not have a hernia or any other issue going on.  He does not have any numbness/tingling in the legs at this time.   Patient complains of bilateral iliac crest pain which radiates into his groin which is worse with forward flexion. He states with sitting or trying to put his shoes on the pain is worse. Patient denies any buttocks pain denies any radicular pain down his legs.  Of note patient has seen urology and was worked up for a hernia in the workup was negative for hernia. Patient has had sciatic pain in the past but this is different than his sciatic pain.              Review of Systems   Assessment & Plan: Visit Diagnoses:  1. Radicular pain of both lower extremities   2. Right hip pain   3. Left hip pain     Plan: Patient seems to be symptomatic from an abdominal aortic aneurysm. With forward flexion this most likely is increasing back pressure and causing increased stretch on the aneurysm. I have talked with Dr. Colvin CaroliMichael Cooper's office and they will call the patient for urgent follow-up for either ultrasound or CT scan. I will follow-up in the office in 4 weeks.  Follow-Up Instructions: Return in about 4 weeks (around 02/26/2017).   Orders:  Orders Placed This Encounter  Procedures  . XR HIPS BILAT W OR W/O PELVIS 3-4 VIEWS  . XR Lumbar Spine 2-3 Views   No orders of the defined types were placed in this encounter.     Procedures: No procedures performed   Clinical Data: No additional findings.  Objective: Vital Signs: There were no vitals taken for this visit.  Physical Exam Gen. examination patient is alert oriented no adenopathy well-dressed normal affect, respiratory effort he has a normal gait. There is no pain with range of motion of the hip or ankle. He has a negative straight leg raise bilaterally no focal motor weakness in either lower extremities. The lumbar spine radiographs shows no significant disc pathology of the lumbar spine. The CT scan does show what appears to be a 4-1/2 cm abdominal aortic aneurysm.  Ortho Exam  Specialty Comments:  No specialty comments available.  Imaging: Xr Hips Bilat W Or W/o Pelvis 3-4 Views  Result Date: 01/29/2017 Two-view radiographs of bilateral hips shows good joint space no subcondylar sclerosis no periarticular bony spurs no cystic changes.  Xr Lumbar Spine 2-3 Views  Result Date: 01/29/2017 Two-view radiographs of lumbar spine show some mild degenerative arthritic changes. Of most concern patient does appear to have an abdominal aortic aneurysm which measures 45 mm.    PMFS History: Patient Active Problem List   Diagnosis Date Noted  . Right hip pain 01/29/2017  . Left hip pain 01/29/2017  .  Abnormal EKG 01/28/2012  . Hypertension, benign 01/28/2012  . Right carotid bruit 01/28/2012   Past Medical History:  Diagnosis Date  . Essential hypertension   . Gastroesophageal reflux disease     Family History  Problem Relation Age of Onset  . Coronary artery disease Mother 21    myocardial infarction  . Cancer - Lung Father   . Heart failure Sister   . Pulmonary fibrosis Brother   . Lymphoma Brother 37    No past surgical history on file. Social History   Occupational History  . Not on file.   Social History Main Topics  . Smoking status: Former Games developer  . Smokeless tobacco: Never Used  . Alcohol use Not on file  . Drug use:  Unknown  . Sexual activity: Not on file

## 2017-01-29 NOTE — Telephone Encounter (Signed)
Phone call received from Dr Lajoyce Cornersuda in regards to abnormal finding on lumbar x-ray.  The pt is being evaluated for back pain so x-ray was performed.  Per Dr Lajoyce Cornersuda the pt complains of pressure across abdomen when sitting and bending over.  Incidental finding on x-ray of 45 mm AAA.  I will make Dr Excell Seltzerooper aware of this information and determine what additional testing should be performed.

## 2017-01-30 ENCOUNTER — Encounter: Payer: Self-pay | Admitting: Cardiovascular Disease

## 2017-01-30 NOTE — Telephone Encounter (Signed)
This encounter was created in error - please disregard.

## 2017-01-30 NOTE — Telephone Encounter (Signed)
Discussed with Dr Excell Seltzerooper and he would like the pt to have an ASAP abdominal aorta duplex.  Pt has been scheduled to have this test performed on 01/31/17 at 11:00, arrive at 10:45 and NPO after midnight. Pt aware of appointment.

## 2017-01-30 NOTE — Telephone Encounter (Signed)
New message    Pt calling to talk to Rn. He states his PCP told him a CT would be scheduled for him. He was calling to see about that.

## 2017-01-31 ENCOUNTER — Ambulatory Visit (HOSPITAL_COMMUNITY)
Admission: RE | Admit: 2017-01-31 | Discharge: 2017-01-31 | Disposition: A | Discharge status: Home / Self Care | Payer: Medicare Other | Source: Ambulatory Visit | Attending: Cardiovascular Disease | Admitting: Cardiovascular Disease

## 2017-01-31 DIAGNOSIS — I700 Atherosclerosis of aorta: Secondary | ICD-10-CM | POA: Diagnosis not present

## 2017-01-31 DIAGNOSIS — I714 Abdominal aortic aneurysm, without rupture, unspecified: Secondary | ICD-10-CM

## 2017-01-31 DIAGNOSIS — Z87891 Personal history of nicotine dependence: Secondary | ICD-10-CM | POA: Insufficient documentation

## 2017-01-31 DIAGNOSIS — I708 Atherosclerosis of other arteries: Secondary | ICD-10-CM | POA: Insufficient documentation

## 2017-01-31 DIAGNOSIS — Z8673 Personal history of transient ischemic attack (TIA), and cerebral infarction without residual deficits: Secondary | ICD-10-CM | POA: Insufficient documentation

## 2017-01-31 DIAGNOSIS — R109 Unspecified abdominal pain: Secondary | ICD-10-CM | POA: Insufficient documentation

## 2017-01-31 DIAGNOSIS — I10 Essential (primary) hypertension: Secondary | ICD-10-CM | POA: Diagnosis not present

## 2017-02-22 NOTE — Progress Notes (Deleted)
Patient is a 71 y.o. Male who presents today for follow up bilateral crest and groin pain. He was sent for urgent follow up for possible AAA at last office visit.

## 2017-02-25 ENCOUNTER — Ambulatory Visit (INDEPENDENT_AMBULATORY_CARE_PROVIDER_SITE_OTHER): Payer: Medicare Other | Admitting: Orthopedic Surgery

## 2017-03-07 ENCOUNTER — Ambulatory Visit (INDEPENDENT_AMBULATORY_CARE_PROVIDER_SITE_OTHER): Payer: Medicare Other | Admitting: Family

## 2017-03-07 ENCOUNTER — Ambulatory Visit (INDEPENDENT_AMBULATORY_CARE_PROVIDER_SITE_OTHER): Payer: Medicare Other | Admitting: Orthopedic Surgery

## 2017-03-07 ENCOUNTER — Other Ambulatory Visit: Payer: Self-pay | Admitting: Gastroenterology

## 2017-03-07 DIAGNOSIS — M25552 Pain in left hip: Secondary | ICD-10-CM | POA: Diagnosis not present

## 2017-03-07 DIAGNOSIS — M25551 Pain in right hip: Secondary | ICD-10-CM | POA: Diagnosis not present

## 2017-03-07 DIAGNOSIS — R1031 Right lower quadrant pain: Secondary | ICD-10-CM

## 2017-03-07 NOTE — Progress Notes (Signed)
Office Visit Note   Patient: Leroy Lara           Date of Birth: [birth year]           MRN: [id number] Visit Date: 03/07/2017              Requested by: Richmond CampbellKristen W. Kaplan, PA-C [address] [place], [place] [address] PCP: Lilia ArgueKAPLAN,KRISTEN, PA-C  No chief complaint on file.     HPI: The patient is a 71 year old gentleman who presents today in follow-up for bilateral iliac crest and groin pain which has been ongoing for months. Right more painful than left. Patient complains of bilateral iliac crest pain which radiates into his groin which is worse with forward flexion. He states with sitting or trying to put his shoes on the pain is worse. Minimal back pain associated. no radicular symptoms. Was seen about 4 weeks ago by Dr. Lajoyce Cornersuda for same. Was referred to Dr. Excell Seltzerooper for evaluation of the abdominal aortic aneurysm. 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 So far will be managed medically.  In the interim was also evaluated by his urologist and his gastroenterologist for the same groin pain. States has a CT scan of abdomen and pelvis scheduled for this following Monday.  Complaining of right knee pain as well. This is been ongoing for years more aggravating in recent weeks. Pain with start up. Complains of stiffness.  Assessment & Plan: Visit Diagnoses:  1. Right hip pain   2. Left hip pain     Plan: States has a CT scheduled for next Tuesday. Out for the results. He deferred cortisone injection to the right knee today. States is unable to take NSAIDs. He'll use Tylenol as needed for pain. Recommended ice and Aspercreme. He'll follow-up in office in if  to continued issues.  Follow-Up Instructions: Return in about 4 weeks (around 04/04/2017), or if symptoms worsen or fail to improve.   Physical Exam  Constitutional: Appears well-developed.  Head: Normocephalic.  Eyes: EOM are normal.  Neck: Normal range of motion.  Cardiovascular: Normal rate.     Pulmonary/Chest: Effort normal.  Neurological: Is alert.  Skin: Skin is warm.  Psychiatric: Has a normal mood and affect. Normal gait.  There is no pain with range of motion of the hip or ankle. He has a negative straight leg raise bilaterally no focal motor weakness in either lower extremities. Right Knee Exam   Tenderness  The patient is experiencing tenderness in the medial joint line.  Range of Motion  The patient has normal right knee ROM.  Tests  Varus: negative Valgus: negative  Other  Swelling: none  Comments:  Crepitation with range of motion.        Imaging: No results found.  Labs: No results found for: HGBA1C, ESRSEDRATE, CRP, LABURIC, REPTSTATUS, GRAMSTAIN, CULT, LABORGA  Orders:  No orders of the defined types were placed in this encounter.  No orders of the defined types were placed in this encounter.    Procedures: No procedures performed  Clinical Data: No additional findings.  ROS:  Review of Systems  Constitutional: Negative for chills and fever.  Cardiovascular: Negative for leg swelling.  Genitourinary:       Groin pain  Musculoskeletal: Positive for arthralgias and back pain. Negative for gait problem.  Neurological: Positive for numbness. Negative for weakness.    Objective: Vital Signs: There were no vitals taken for this visit.  Specialty Comments:  No specialty comments available.  PMFS  History: Patient Active Problem List   Diagnosis Date Noted  . Right hip pain 01/29/2017  . Left hip pain 01/29/2017  . Abnormal EKG 01/28/2012  . Hypertension, benign 01/28/2012  . Right carotid bruit 01/28/2012   Past Medical History:  Diagnosis Date  . Essential hypertension   . Gastroesophageal reflux disease     Family History  Problem Relation Age of Onset  . Coronary artery disease Mother 35    myocardial infarction  . Cancer - Lung Father   . Heart failure Sister   . Pulmonary fibrosis Brother   . Lymphoma Brother  37    No past surgical history on file. Social History   Occupational History  . Not on file.   Social History Main Topics  . Smoking status: Former Games developer  . Smokeless tobacco: Never Used  . Alcohol use Not on file  . Drug use: Unknown  . Sexual activity: Not on file

## 2017-03-11 ENCOUNTER — Ambulatory Visit
Admission: RE | Admit: 2017-03-11 | Discharge: 2017-03-11 | Disposition: A | Discharge status: Home / Self Care | Payer: Medicare Other | Source: Ambulatory Visit | Attending: Gastroenterology | Admitting: Gastroenterology

## 2017-03-11 DIAGNOSIS — R1031 Right lower quadrant pain: Secondary | ICD-10-CM

## 2017-03-11 IMAGING — CT CT ABD-PELV W/ CM
2 of 5 series · 14 of 46 positions shown, 16 images · IV contrast (APPLIED)
Comparison: None.

CLINICAL DATA: Bilateral groin pain for 6 months.

EXAM:
CT ABDOMEN AND PELVIS WITH CONTRAST
TECHNIQUE: Multidetector CT imaging of the abdomen and pelvis was performed
using the standard protocol following bolus administration of
intravenous contrast.
CONTRAST:  100mL [A3] IOPAMIDOL ([A3]) INJECTION 61%

[Series 2: abd/pelvis w/cm · axial · 0.79mm/px · z∈[-565,-130]mm · 11 of 99 slices shown, 13 images]
[im 6/99  soft-tissue]
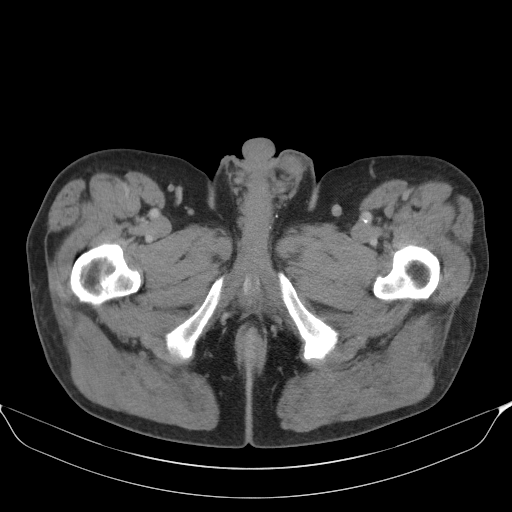
[im 6/99  bone]
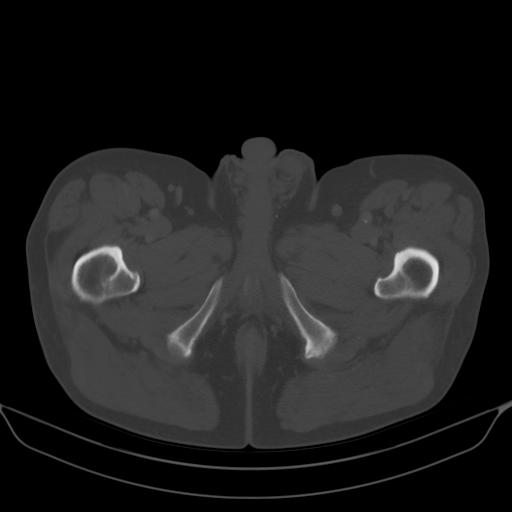
[im 17/99  soft-tissue]
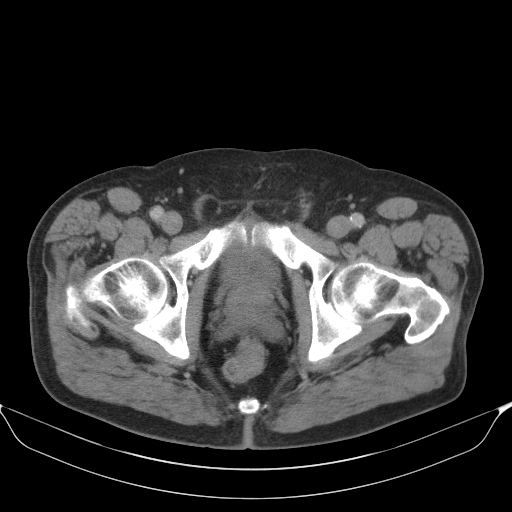
[im 22/99  soft-tissue]
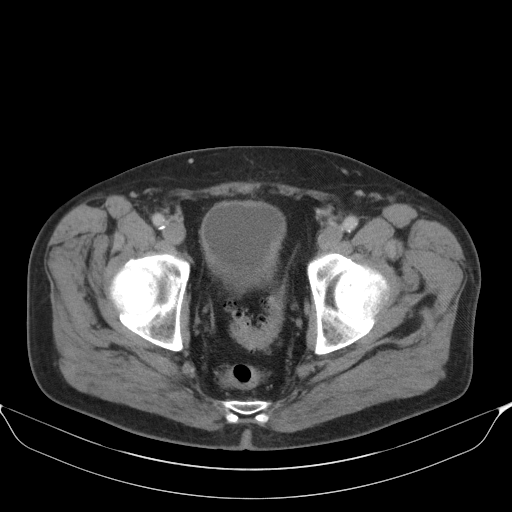
[im 33/99  soft-tissue]
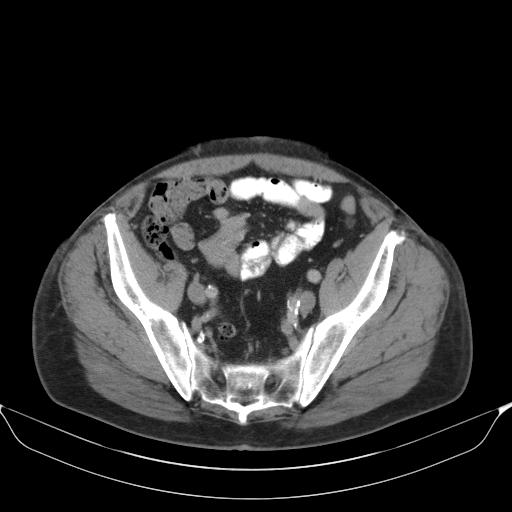
[im 39/99  soft-tissue]
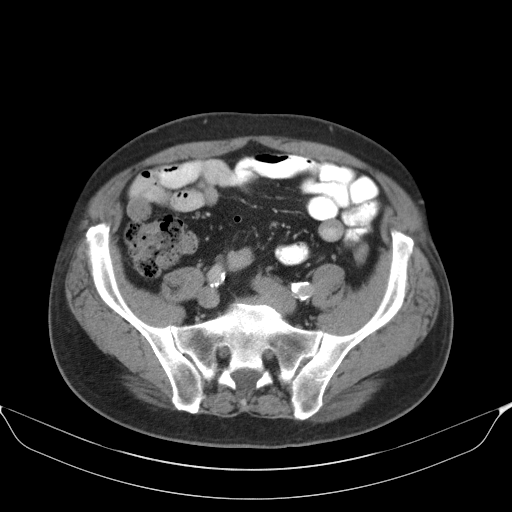
[im 50/99  soft-tissue]
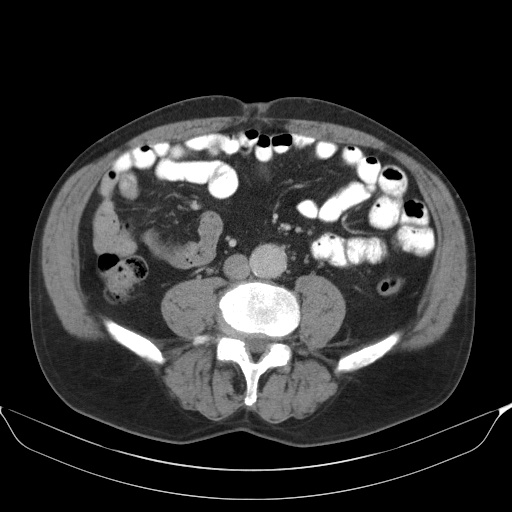
[im 60/99  soft-tissue]
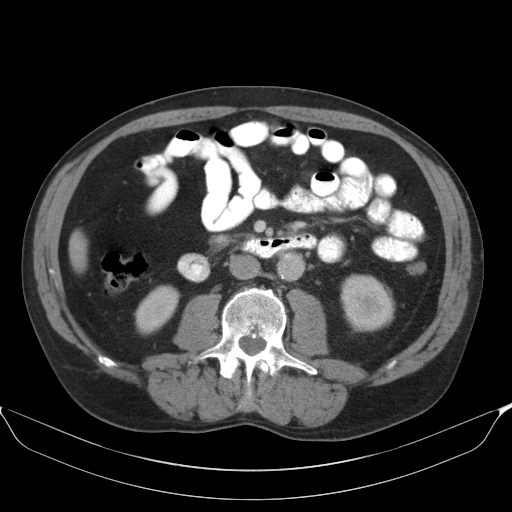
[im 66/99  soft-tissue]
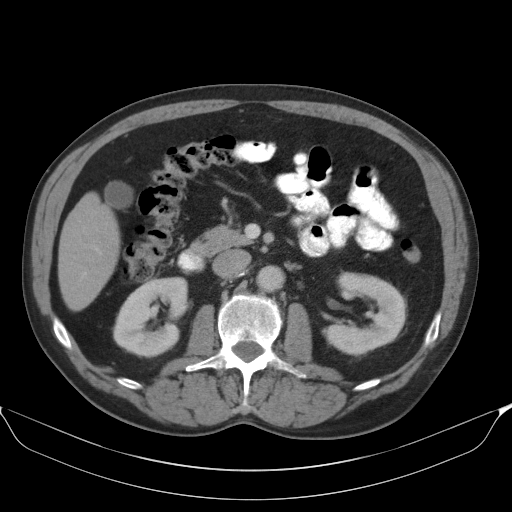
[im 77/99  soft-tissue]
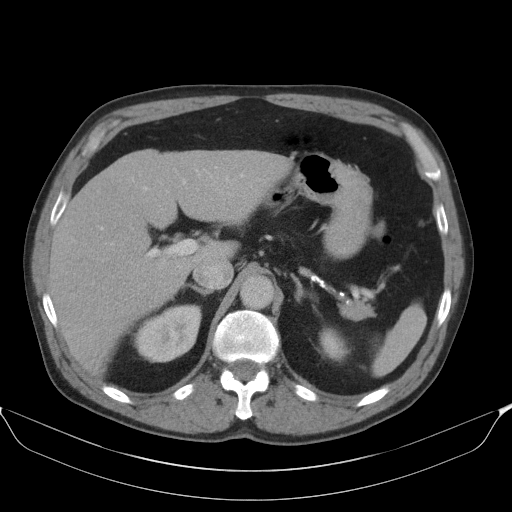
[im 77/99  bone]
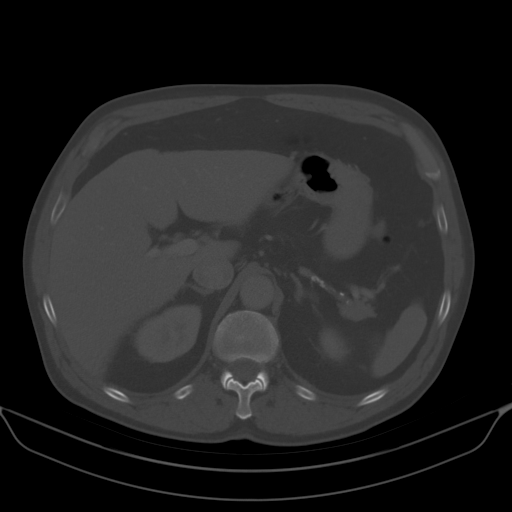
[im 82/99  soft-tissue]
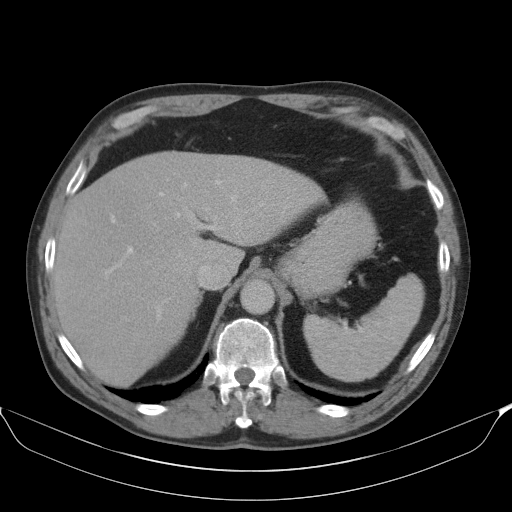
[im 93/99  soft-tissue]
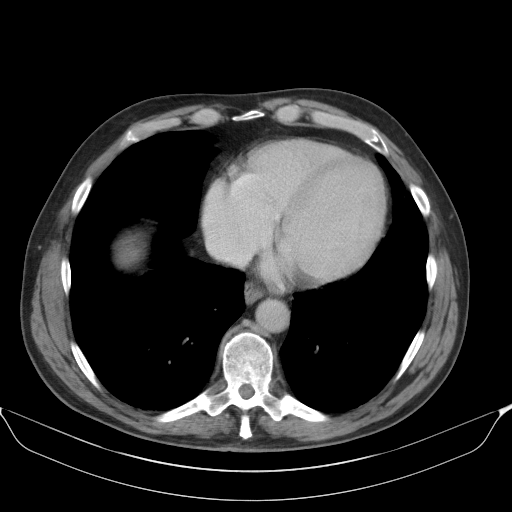

[Series 3: cor · coronal · 0.69mm/px · 3 of 96 slices shown]
[im 32/96  soft-tissue]
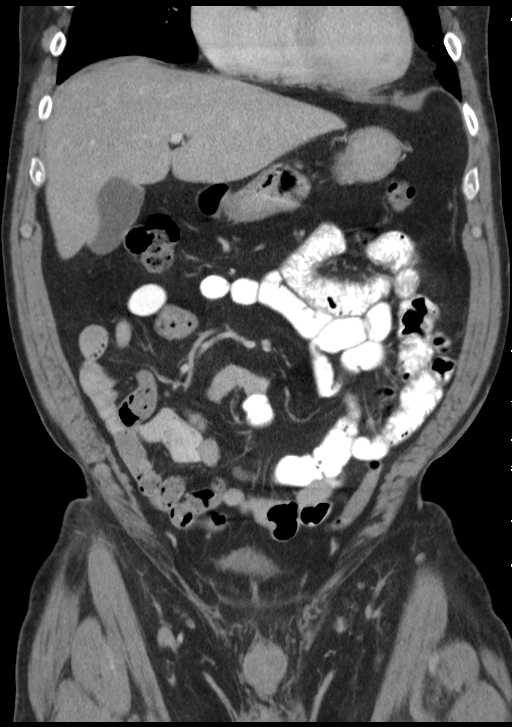
[im 43/96  soft-tissue]
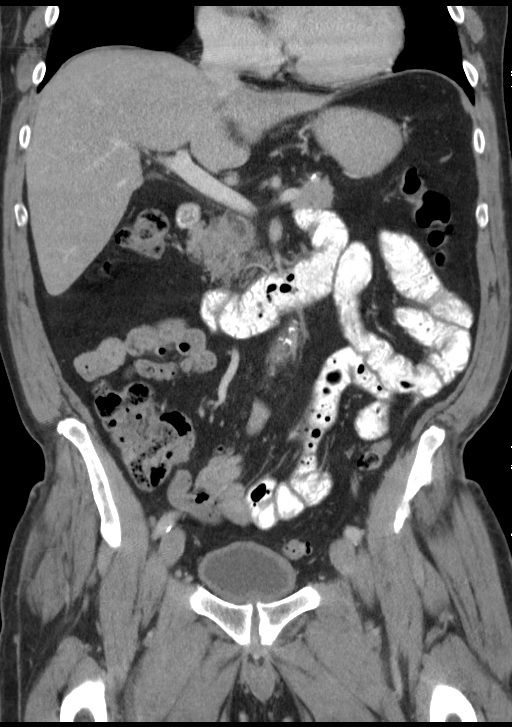
[im 53/96  soft-tissue]
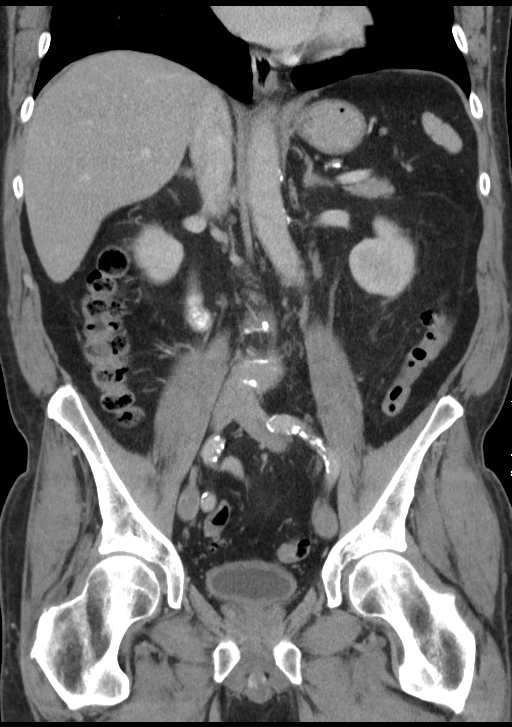

[14 of 46 positions shown; findings below may reference images not displayed]

FINDINGS: Lower chest: There is emphysema of the lung bases. Pleural-based 5
by 4 mm left lower lobe nodule, image [DATE]. 6 by 2 mm nodule in the
right middle lobe on image [DATE] as well.

Mild cardiomegaly.

Hepatobiliary: Unremarkable

Pancreas: Unremarkable

Spleen: Unremarkable

Adrenals/Urinary Tract: No urinary tract calculi are abnormal renal
parenchymal enhancement identified. Adrenal glands normal.
Borderline bladder wall thickening is probably mostly due to
nondistention.

Stomach/Bowel: Sigmoid diverticulosis and scattered diverticula of
the descending colon without active diverticulitis identified.
Appendix normal. No dilated bowel.

Vascular/Lymphatic: Aortoiliac atherosclerotic vascular disease.
Fusiform infrarenal abdominal aortic aneurysm 3.0 cm in diameter.
Borderline prominent right external iliac lymph node, 1 cm in short
axis on image 37/3.

Reproductive: Curvilinear calcifications in the central zone of the
prostate gland.

Other: No supplemental non-categorized findings.

Musculoskeletal: Suspected right trochanteric bursitis with density
tracking along the right iliotibial band.

Slight cortical irregularity along the right ninth rib laterally,
images 15-16 of series 2, probably incidental but conceivably could
be a sign of a nondisplaced rib fracture.

Levoconvex lumbar scoliosis observed with right foraminal
impingement at L4-5 and left foraminal impingement at L5-S1 due to
spurring and degenerative disc disease.

Small umbilical hernia contains adipose tissue and a small amount of
fluid density as on image 62/4.
IMPRESSION: 1. Mild irregularity of the right lateral ninth rib, possibly
incidental or due to an old injury but I cannot exclude a
nondisplaced rib fracture, correlate with any tenderness overlying
the lateral right ninth rib.
2. Lower lumbar impingement due to spurring, degenerative disc
disease, and scoliosis.
3. Appendix normal.  No renal calculi.
4. **An incidental finding of potential clinical significance has
been found. 3 cm infrarenal abdominal aortic aneurysm. Recommend
followup by ultrasound in 2 years. This recommendation follows ACR
consensus guidelines: White Paper of the ACR Incidental Findings
Committee II on Vascular Findings. [HOSPITAL] [A3];
[DATE].**
5. Pleural-based 5 by 4 mm left lower lobe lung nodule. No follow-up
needed if patient is low-risk. Non-contrast chest CT can be
considered in 12 months if patient is high-risk. This recommendation
follows the consensus statement: Guidelines for Management of
Incidental Pulmonary Nodules Detected on CT Images: From the
6. Emphysema in the lung bases.
7. Sigmoid diverticulosis without active diverticulitis.
8.  Aortoiliac atherosclerotic vascular disease.
9. Right trochanteric bursitis.
10. Small umbilical hernia.

## 2017-03-11 MED ORDER — IOPAMIDOL (ISOVUE-300) INJECTION 61%
100.0000 mL | Freq: Once | INTRAVENOUS | Status: AC | PRN
  Administered 2017-03-11: 100 mL via INTRAVENOUS

## 2017-06-10 ENCOUNTER — Ambulatory Visit (INDEPENDENT_AMBULATORY_CARE_PROVIDER_SITE_OTHER): Payer: Medicare Other | Admitting: Cardiovascular Disease

## 2017-06-10 ENCOUNTER — Encounter: Payer: Self-pay | Admitting: Cardiovascular Disease

## 2017-06-10 VITALS — BP 144/80 | HR 66 | Ht 72.0 in | Wt 201.1 lb

## 2017-06-10 DIAGNOSIS — R0989 Other specified symptoms and signs involving the circulatory and respiratory systems: Secondary | ICD-10-CM

## 2017-06-10 DIAGNOSIS — I482 Chronic atrial fibrillation: Secondary | ICD-10-CM | POA: Diagnosis not present

## 2017-06-10 DIAGNOSIS — I4821 Permanent atrial fibrillation: Secondary | ICD-10-CM

## 2017-06-10 DIAGNOSIS — R0602 Shortness of breath: Secondary | ICD-10-CM

## 2017-06-10 DIAGNOSIS — E782 Mixed hyperlipidemia: Secondary | ICD-10-CM

## 2017-06-10 NOTE — Patient Instructions (Signed)
Medication Instructions:  Your physician recommends that you continue on your current medications as directed. Please refer to the Current Medication list given to you today.  Labwork: Your physician recommends that you return for a FASTING LIPID and CMP in December--nothing to eat or drink after midnight, lab opens at 7:30 AM   Testing/Procedures: Your physician has requested that you have an echocardiogram in December. Echocardiography is a painless test that uses sound waves to create images of your heart. It provides your doctor with information about the size and shape of your heart and how well your heart's chambers and valves are working. This procedure takes approximately one hour. There are no restrictions for this procedure.  Your physician has requested that you have a carotid duplex in December. This test is an ultrasound of the carotid arteries in your neck. It looks at blood flow through these arteries that supply the brain with blood. Allow one hour for this exam. There are no restrictions or special instructions.  Follow-Up: Your physician recommends that you schedule a follow-up appointment in: December 2018 with Dr Excell Seltzerooper    Any Other Special Instructions Will Be Listed Below (If Applicable).     If you need a refill on your cardiac medications before your next appointment, please call your pharmacy.

## 2017-06-10 NOTE — Progress Notes (Signed)
Cardiology Office Note Date:  06/12/2017   ID:  RALLY OUCH, DOB 11/10/1946, MRN [id number]  PCP:  Aletha Halim., PA-C  Cardiologist:  Sherren Mocha, MD    Chief Complaint  Patient presents with  . Follow-up    atrial fibrillation, unspecified type     History of Present Illness: Leroy Lara is a 71 y.o. male who presents for Follow-up of atrial fibrillation.  Other medical problems include hypertension, hyperlipidemia, left bundle branch block, and abdominal aortic aneurysm. The patient is a former smoker.  He is here alone today. Overall doing pretty well. He is tolerating anticoagulation without bleeding problems. States that he has "good days and bad days." He has some limitations from joint pains. He's done heart physical work his entire life. Also has some generalized fatigue. No chest pain or shortness of breath. No edema, orthopnea, PND, heart palpitations, lightheadedness, or syncope.   Past Medical History:  Diagnosis Date  . Essential hypertension   . Gastroesophageal reflux disease     No past surgical history on file.  Current Outpatient Prescriptions  Medication Sig Dispense Refill  . apixaban (ELIQUIS) 5 MG TABS tablet Take 1 tablet (5 mg total) by mouth 2 (two) times daily. 60 tablet 11  . atorvastatin (LIPITOR) 10 MG tablet Take 1 tablet (10 mg total) by mouth daily. 30 tablet 11  . losartan-hydrochlorothiazide (HYZAAR) 100-25 MG tablet Take 1 tablet by mouth daily. 30 tablet 11  . omeprazole (PRILOSEC) 40 MG capsule Take 1 capsule by mouth daily.      No current facility-administered medications for this visit.     Allergies:   Sulfamethoxazole and Sulfonamide derivatives   Social History:  The patient  reports that he has quit smoking. He has never used smokeless tobacco.   Family History:  The patient's family history includes Cancer - Lung in his father; Coronary artery disease (age of onset: 38) in his mother; Heart failure in his sister;  Lymphoma (age of onset: 9) in his brother; Pulmonary fibrosis in his brother.    ROS:  Please see the history of present illness.  Otherwise, review of systems is positive for Cough, back pain, muscle pain, easy bruising, joint swelling.  All other systems are reviewed and negative.    PHYSICAL EXAM: VS:  BP (!) 144/80   Pulse 66   Ht 6' (1.829 m)   Wt 201 lb 1.9 oz (91.2 kg)   BMI 27.28 kg/m  , BMI Body mass index is 27.28 kg/m. GEN: Well nourished, well developed, in no acute distress  HEENT: normal  Neck: no JVD, no masses. bilateral carotid bruits Cardiac: irregularly irregular without murmur or gallop       Respiratory:  clear to auscultation bilaterally, normal work of breathing GI: soft, nontender, nondistended, + BS MS: no deformity or atrophy  Ext: no pretibial edema, pedal pulses 2+= bilaterally Skin: warm and dry, no rash Neuro:  Strength and sensation are intact Psych: euthymic mood, full affect  EKG:  EKG is ordered today. The ekg ordered today shows atrial fibrillation 67 bpm, left bundle branch block, no significant change from previous.  Recent Labs: 11/16/2016: ALT 20; BUN 16; Creat 1.01; Hemoglobin 14.7; Platelets 291; Potassium 4.5; Sodium 138; TSH 0.78   Lipid Panel     Component Value Date/Time   CHOL 175 01/10/2016 0759   TRIG 95 01/10/2016 0759   HDL 59 01/10/2016 0759   CHOLHDL 3.0 01/10/2016 0759   VLDL 19 01/10/2016 0759  [address] 97 01/10/2016 0759   LDLDIRECT 154.8 08/26/2013 0739      Wt Readings from Last 3 Encounters:  06/10/17 201 lb 1.9 oz (91.2 kg)  11/16/16 206 lb (93.4 kg)  08/20/16 206 lb 3.2 oz (93.5 kg)     Cardiac Studies Reviewed: 2D Echo 09/04/2016: Study Conclusions  - Left ventricle: The cavity size was normal. There was moderate   concentric hypertrophy. Systolic function was normal. The   estimated ejection fraction was in the range of 50% to 55%.   Diffuse hypokinesis. The study was not technically sufficient to    allow evaluation of LV diastolic dysfunction due to atrial   fibrillation. - Ventricular septum: Septal motion showed paradox. - Aortic valve: Trileaflet; normal thickness leaflets.   Transvalvular velocity was within the normal range. There was no   stenosis. There was no regurgitation. - Aortic root: The aortic root was normal in size. - Mitral valve: There was mild regurgitation. - Left atrium: The atrium was moderately dilated. - Right ventricle: The cavity size was normal. Wall thickness was   normal. Systolic function was normal. - Right atrium: The atrium was normal in size. - Tricuspid valve: There was trivial regurgitation. - Pulmonary arteries: Systolic pressure was within the normal   range. - Inferior vena cava: The vessel was normal in size. The   respirophasic diameter changes were in the normal range (= 50%),   consistent with normal central venous pressure. - Pericardium, extracardiac: There was no pericardial effusion.   ASSESSMENT AND PLAN: 1.  Permanent atrial fibrillation: Patient tolerating anticoagulation. His heart rate is controlled. Recommend a repeat echocardiogram when he returns for follow-up in 6 months. Last echo was reviewed today.  2. Hypertension: The patient will continue on losartan hydrochlorothiazide. Will update his lab work when he returns for follow-up.  3. Left bundle branch block: Stable heart rhythm. Check echo at time of return visit.  4. Carotid bruit: Check carotid duplex. Continue risk reduction measures with a statin drug.  5. Hyperlipidemia: Continue atorvastatin. Follow-up labs when he returns for follow-up. Last laboratory data from 2017 reviewed with an HDL of 59, LDL 97, and total cholesterol 175. Dose of statin is limited by joint pains.  Current medicines are reviewed with the patient today.  The patient  concerns regarding medicines.  Labs/ tests ordered today include:   Orders Placed This Encounter  Procedures  . Lipid  panel  . Comp Met (CMET)  . EKG 12-Lead  . ECHOCARDIOGRAM COMPLETE    Disposition:   FU 6 months  Signed, Sherren Mocha, MD  06/12/2017 9:57 AM    [facility] [place], [place], [place]  [address] Phone: [phone]; Fax: [phone]

## 2017-07-13 ENCOUNTER — Other Ambulatory Visit: Payer: Self-pay | Admitting: Cardiovascular Disease

## 2017-07-13 DIAGNOSIS — R9431 Abnormal electrocardiogram [ECG] [EKG]: Secondary | ICD-10-CM

## 2017-07-13 DIAGNOSIS — E785 Hyperlipidemia, unspecified: Secondary | ICD-10-CM

## 2017-07-13 DIAGNOSIS — I10 Essential (primary) hypertension: Secondary | ICD-10-CM

## 2017-08-21 ENCOUNTER — Other Ambulatory Visit: Payer: Self-pay | Admitting: Cardiovascular Disease

## 2017-08-21 DIAGNOSIS — E785 Hyperlipidemia, unspecified: Secondary | ICD-10-CM

## 2017-08-21 DIAGNOSIS — I10 Essential (primary) hypertension: Secondary | ICD-10-CM

## 2017-08-21 DIAGNOSIS — R9431 Abnormal electrocardiogram [ECG] [EKG]: Secondary | ICD-10-CM

## 2017-11-18 ENCOUNTER — Other Ambulatory Visit: Payer: Medicare Other

## 2017-11-18 ENCOUNTER — Other Ambulatory Visit (HOSPITAL_COMMUNITY): Payer: Medicare Other

## 2017-11-19 ENCOUNTER — Inpatient Hospital Stay (HOSPITAL_COMMUNITY)
Admission: RE | Admit: 2017-11-19 | Payer: Medicare Other | Source: Ambulatory Visit | Attending: Cardiovascular Disease | Admitting: Cardiovascular Disease

## 2017-11-22 ENCOUNTER — Telehealth: Payer: Self-pay | Admitting: Cardiovascular Disease

## 2017-11-22 NOTE — Telephone Encounter (Signed)
Rescheduled patient to 12/28 so Dr. Excell Seltzerooper may review results at [facility]. He was grateful for call and agrees with treatment plan.

## 2017-11-22 NOTE — Telephone Encounter (Signed)
New Message  Pt would like to know if it is okay that his appt with Dr. Excell Seltzerooper is ok, or would he prefer to see him after his test on 12/19

## 2017-11-25 ENCOUNTER — Ambulatory Visit: Payer: Medicare Other | Admitting: Cardiovascular Disease

## 2017-11-27 ENCOUNTER — Other Ambulatory Visit: Payer: Self-pay

## 2017-11-27 ENCOUNTER — Ambulatory Visit (HOSPITAL_COMMUNITY)
Admission: RE | Admit: 2017-11-27 | Discharge: 2017-11-27 | Disposition: A | Discharge status: Home / Self Care | Payer: Medicare Other | Source: Ambulatory Visit | Attending: Cardiovascular Disease | Admitting: Cardiovascular Disease

## 2017-11-27 ENCOUNTER — Ambulatory Visit (HOSPITAL_COMMUNITY): Payer: Medicare Other | Attending: Internal Medicine

## 2017-11-27 ENCOUNTER — Other Ambulatory Visit: Payer: Medicare Other

## 2017-11-27 DIAGNOSIS — E782 Mixed hyperlipidemia: Secondary | ICD-10-CM

## 2017-11-27 DIAGNOSIS — I714 Abdominal aortic aneurysm, without rupture: Secondary | ICD-10-CM | POA: Diagnosis not present

## 2017-11-27 DIAGNOSIS — I482 Chronic atrial fibrillation: Secondary | ICD-10-CM | POA: Diagnosis present

## 2017-11-27 DIAGNOSIS — R0600 Dyspnea, unspecified: Secondary | ICD-10-CM | POA: Diagnosis not present

## 2017-11-27 DIAGNOSIS — R0989 Other specified symptoms and signs involving the circulatory and respiratory systems: Secondary | ICD-10-CM | POA: Insufficient documentation

## 2017-11-27 DIAGNOSIS — I4821 Permanent atrial fibrillation: Secondary | ICD-10-CM

## 2017-11-27 DIAGNOSIS — R0602 Shortness of breath: Secondary | ICD-10-CM | POA: Insufficient documentation

## 2017-11-27 DIAGNOSIS — I10 Essential (primary) hypertension: Secondary | ICD-10-CM | POA: Insufficient documentation

## 2017-11-27 LAB — COMPREHENSIVE METABOLIC PANEL
ALT: 18 IU/L (ref 0–44)
AST: 20 IU/L (ref 0–40)
Albumin/Globulin Ratio: 1.5 (ref 1.2–2.2)
Albumin: 3.9 g/dL (ref 3.5–4.8)
Alkaline Phosphatase: 62 IU/L (ref 39–117)
BILIRUBIN TOTAL: 0.6 mg/dL (ref 0.0–1.2)
BUN/Creatinine Ratio: 19 (ref 10–24)
BUN: 17 mg/dL (ref 8–27)
CALCIUM: 9 mg/dL (ref 8.6–10.2)
CHLORIDE: 98 mmol/L (ref 96–106)
CO2: 25 mmol/L (ref 20–29)
Creatinine, Ser: 0.89 mg/dL (ref 0.76–1.27)
GFR, EST AFRICAN AMERICAN: 99 mL/min/{1.73_m2} (ref >59)
GFR, EST NON AFRICAN AMERICAN: 86 mL/min/{1.73_m2} (ref >59)
GLUCOSE: 98 mg/dL (ref 65–99)
Globulin, Total: 2.6 g/dL (ref 1.5–4.5)
Potassium: 4.1 mmol/L (ref 3.5–5.2)
Sodium: 136 mmol/L (ref 134–144)
TOTAL PROTEIN: 6.5 g/dL (ref 6.0–8.5)

## 2017-11-27 LAB — LIPID PANEL
CHOL/HDL RATIO: 3.4 ratio (ref 0.0–5.0)
Cholesterol, Total: 190 mg/dL (ref 100–199)
HDL: 56 mg/dL (ref >39)
LDL Calculated: 106 mg/dL — ABNORMAL HIGH (ref 0–99)
Triglycerides: 140 mg/dL (ref 0–149)
VLDL Cholesterol Cal: 28 mg/dL (ref 5–40)

## 2017-12-06 ENCOUNTER — Encounter: Payer: Self-pay | Admitting: Cardiovascular Disease

## 2017-12-06 ENCOUNTER — Ambulatory Visit (INDEPENDENT_AMBULATORY_CARE_PROVIDER_SITE_OTHER): Payer: Medicare Other | Admitting: Cardiovascular Disease

## 2017-12-06 VITALS — BP 124/80 | HR 65 | Ht 72.0 in | Wt 204.2 lb

## 2017-12-06 DIAGNOSIS — E785 Hyperlipidemia, unspecified: Secondary | ICD-10-CM

## 2017-12-06 DIAGNOSIS — I714 Abdominal aortic aneurysm, without rupture, unspecified: Secondary | ICD-10-CM

## 2017-12-06 DIAGNOSIS — I4891 Unspecified atrial fibrillation: Secondary | ICD-10-CM | POA: Diagnosis not present

## 2017-12-06 DIAGNOSIS — I10 Essential (primary) hypertension: Secondary | ICD-10-CM | POA: Diagnosis not present

## 2017-12-06 DIAGNOSIS — R9431 Abnormal electrocardiogram [ECG] [EKG]: Secondary | ICD-10-CM | POA: Diagnosis not present

## 2017-12-06 MED ORDER — ATORVASTATIN CALCIUM 10 MG PO TABS: 10.0000 mg | ORAL_TABLET | Freq: Every day | ORAL | 11 refills | Status: DC

## 2017-12-06 MED ORDER — APIXABAN 5 MG PO TABS: 5.0000 mg | ORAL_TABLET | Freq: Two times a day (BID) | ORAL | 11 refills | Status: DC

## 2017-12-06 MED ORDER — LOSARTAN POTASSIUM-HCTZ 100-25 MG PO TABS: 1.0000 | ORAL_TABLET | Freq: Every day | ORAL | 11 refills | Status: DC

## 2017-12-06 NOTE — Progress Notes (Signed)
Cardiology Office Note Date:  12/06/2017   ID:  Leroy Lara, DOB March 06, 1946, MRN [id number]  PCP:  Richmond CampbellKaplan, Kristen W., PA-C  Cardiologist:  Tonny BollmanMichael Keelyn Monjaras, MD    Chief Complaint  Patient presents with  . Follow-up    A Fib     History of Present Illness: Leroy Lara is a 71 y.o. male who presents for follow-up evaluation.  The patient is followed for permanent atrial fibrillation, abdominal aortic aneurysm, and hypertension.  He is here alone today.  Feels like his body is wearing down but otherwise cardiac-related complaints.  His joints are very stiff in the mornings.  He has been very active with physical work his entire life.  Denies chest pain, chest pressure, shortness of breath, heart palpitations, lightheadedness, or syncope.  Complains of mild leg swelling and notices a sock line at the end of the day.  Swelling is resolved when he wakes up in the morning.  No symptoms of calf pain with ambulation.  No bleeding problems on chronic anticoagulation.  Past Medical History:  Diagnosis Date  . Essential hypertension   . Gastroesophageal reflux disease     No past surgical history on file.  Current Outpatient Medications  Medication Sig Dispense Refill  . omeprazole (PRILOSEC) 40 MG capsule Take 1 capsule by mouth daily.     Marland Kitchen. apixaban (ELIQUIS) 5 MG TABS tablet Take 1 tablet (5 mg total) by mouth 2 (two) times daily. 60 tablet 11  . atorvastatin (LIPITOR) 10 MG tablet TAKE 1 TABLET (10 MG TOTAL) BY MOUTH DAILY. 30 tablet 11  . losartan-hydrochlorothiazide (HYZAAR) 100-25 MG tablet TAKE 1 TABLET BY MOUTH DAILY. 30 tablet 11   No current facility-administered medications for this visit.     Allergies:   Sulfamethoxazole and Sulfonamide derivatives   Social History:  The patient  reports that he has quit smoking. he has never used smokeless tobacco.   Family History:  The patient's family history includes Cancer - Lung in his father; Coronary artery disease (age of  onset: 8070) in his mother; Heart failure in his sister; Lymphoma (age of onset: 3337) in his brother; Pulmonary fibrosis in his brother.    ROS:  Please see the history of present illness.  Otherwise, review of systems is positive for leg swelling, cough, back pain, muscle pain, easy bruising, snoring.  All other systems are reviewed and negative.    PHYSICAL EXAM: VS:  BP 124/80   Pulse 65   Ht 6' (1.829 m)   Wt 204 lb 3.2 oz (92.6 kg)   BMI 27.69 kg/m  , BMI Body mass index is 27.69 kg/m. GEN: Well nourished, well developed, in no acute distress  HEENT: normal  Neck: no JVD, no masses. No carotid bruits Cardiac: irregularly irregular without murmur or gallop                Respiratory:  clear to auscultation bilaterally, normal work of breathing GI: soft, nontender, nondistended, + BS MS: no deformity or atrophy  Ext: no pretibial edema Skin: warm and dry, no rash Neuro:  Strength and sensation are intact Psych: euthymic mood, full affect  EKG:  EKG is ordered today. The ekg ordered today shows atrial fibrillation 65 bpm, left bundle branch block, rightward axis.  No significant change from previous tracing.  Recent Labs: 11/27/2017: ALT 18; BUN 17; Creatinine, Ser 0.89; Potassium 4.1; Sodium 136   Lipid Panel     Component Value Date/Time   CHOL 190  11/27/2017 0823   TRIG 140 11/27/2017 0823   HDL 56 11/27/2017 0823   CHOLHDL 3.4 11/27/2017 0823   CHOLHDL 3.0 01/10/2016 0759   VLDL 19 01/10/2016 0759   LDLCALC 106 (H) 11/27/2017 0823   LDLDIRECT 154.8 08/26/2013 0739      Wt Readings from Last 3 Encounters:  12/06/17 204 lb 3.2 oz (92.6 kg)  06/10/17 201 lb 1.9 oz (91.2 kg)  11/16/16 206 lb (93.4 kg)     Cardiac Studies Reviewed: Carotid US 11-27-2017: Final Interpretation: Right Carotid: There is evidence in the right ICA of a 1-39% stenosis.  Left Carotid: There is evidence in the left ICA of a 1-39% stenosis.       Non-hemodynamically significant  plaque noted in the CCA.  Vertebrals: Both vertebral arteries were patent with antegrade flow. Subclavians: Normal flow hemodynamics were seen in bilateral subclavian       arteries. 11-27-2017 Echo: Study Conclusions  - Left ventricle: The cavity size was normal. Wall thickness was   increased in a pattern of moderate LVH. Systolic function was   normal. The estimated ejection fraction was in the range of 55%   to 60%. Incoordinate septal motion. The study is not technically   sufficient to allow evaluation of LV diastolic function. - Mitral valve: Mildly thickened leaflets . There was trivial   regurgitation. - Left atrium: Moderately dilated. - Right atrium: Moderately dilated. - Tricuspid valve: There was trivial regurgitation. - Pulmonary arteries: PA peak pressure: 26 mm Hg (S). - Inferior vena cava: The vessel was normal in size. The   respirophasic diameter changes were in the normal range (= 50%),   consistent with normal central venous pressure.  Impressions:  - Compared to a prior study in 08/2016, the LVEF is higher at   55-60%. A-fib is noted and there is moderate biatrial enlargment.  AAA Duplex: Impressions Study is technically challenging secondary to body habitus and overlying bowel gas. Duplex imaging of the abdominal aorta, with color Doppler, reveals an infrarenal fusiform dilatation in the distal aorta, measuring 3.0 cm x 3.3 cm. There is atherosclerosis throughout the aorta and iliac arteries. Bilateral common and right external iliac artery velocities are elevated. Left external iliac artery is normal. Patent IVC. Technologist Notes Technically challenging study. Infrarenal fusiform AAA in the distal aorta, measuring 1.2 cm smaller than AAA seen on lumbar x-ray. 3.3 cm  Aorto-iliac atherosclerosis. >50%, lower end of range, stenosis in the bilateral common and right external iliac arteries. Patent IVC. F/U 1 year.  ASSESSMENT AND PLAN: 1.   Permanent atrial fibrillation: The patient is anticoagulated with apixaban.  He is having no significant bleeding problems.  Appears to be tolerating atrial fibrillation relatively well.  Echocardiogram is reviewed and demonstrates normal left ventricular function.  We will continue current management.  Heart rate is well controlled.  2.  Carotid stenosis without history of stroke: His most recent carotid Doppler study is reviewed and demonstrates patent carotid arteries with mild heterogenous plaque less than 40% stenosis bilaterally.  We will plan to follow him clinically in this regard.  3.  Abdominal aortic aneurysm: The patient has a small abdominal aortic aneurysm.  His last study is reviewed from February 2018.  We will repeat a surveillance ultrasound before his next visit in 6 months.  His physical exam is benign today.  4.  Hypertension: Blood pressure is controlled on losartan/hydrochlorothiazide  5.  Hyperlipidemia: Most recent lipids are reviewed.  He is tolerating atorvastatin 10 mg.  While his LDL cholesterol is above goal, I think he is tolerating the highest dose of the statin he will be able to take because of his extensive arthritis in worsening aches and pains since he has started a statin drug.  He is willing to continue at the current dose.  He is counseled regarding lifestyle today.  Current medicines are reviewed with the patient today.  The patient does not have concerns regarding medicines.  Labs/ tests ordered today include:  No orders of the defined types were placed in this encounter.  Disposition:   FU 6 months  Signed, [name], MD  12/06/2017 11:07 AM    [facility] [address] [place], [place], [place]  [address] Phone: [phone]; Fax: [phone]

## 2017-12-06 NOTE — Patient Instructions (Signed)
Medication Instructions:  Your provider recommends that you continue on your current medications as directed. Please refer to the Current Medication list given to you today.    Labwork: None  Testing/Procedures: Dr. Excell Seltzerooper recommends you have a AAA duplex prior to your appointment with him in 6 months.  Follow-Up: Your provider wants you to follow-up in: 6 months with Dr. Excell Seltzerooper. You will receive a reminder letter in the mail two months in advance. If you don't receive a letter, please call our office to schedule the follow-up appointment.    Any Other Special Instructions Will Be Listed Below (If Applicable).     If you need a refill on your cardiac medications before your next appointment, please call your pharmacy.

## 2018-01-30 ENCOUNTER — Ambulatory Visit (HOSPITAL_COMMUNITY)
Admission: RE | Admit: 2018-01-30 | Discharge: 2018-01-30 | Disposition: A | Discharge status: Home / Self Care | Payer: Medicare Other | Source: Ambulatory Visit | Attending: Cardiology | Admitting: Cardiology

## 2018-01-30 DIAGNOSIS — I714 Abdominal aortic aneurysm, without rupture, unspecified: Secondary | ICD-10-CM

## 2018-01-30 DIAGNOSIS — I10 Essential (primary) hypertension: Secondary | ICD-10-CM | POA: Diagnosis not present

## 2018-01-30 DIAGNOSIS — Z87891 Personal history of nicotine dependence: Secondary | ICD-10-CM | POA: Insufficient documentation

## 2018-02-06 ENCOUNTER — Telehealth: Payer: Self-pay

## 2018-02-06 DIAGNOSIS — I714 Abdominal aortic aneurysm, without rupture, unspecified: Secondary | ICD-10-CM

## 2018-02-06 NOTE — Telephone Encounter (Signed)
Informed patient of results and verbal understanding expressed.  Repeat study ordered to be scheduled in 1 year. Patient agrees with treatment plan. 

## 2018-02-06 NOTE — Telephone Encounter (Signed)
-----   Message from Tonny BollmanMichael Cooper, MD sent at 01/30/2018  5:11 PM EST ----- Stable findings plan FU in one year

## 2018-06-02 ENCOUNTER — Inpatient Hospital Stay (HOSPITAL_COMMUNITY): Admission: RE | Admit: 2018-06-02 | Payer: Medicare Other | Source: Ambulatory Visit

## 2018-06-09 ENCOUNTER — Ambulatory Visit (INDEPENDENT_AMBULATORY_CARE_PROVIDER_SITE_OTHER): Payer: Medicare Other | Admitting: Cardiovascular Disease

## 2018-06-09 ENCOUNTER — Encounter: Payer: Self-pay | Admitting: Cardiovascular Disease

## 2018-06-09 VITALS — BP 140/80 | HR 46 | Ht 72.0 in | Wt 201.0 lb

## 2018-06-09 DIAGNOSIS — R001 Bradycardia, unspecified: Secondary | ICD-10-CM | POA: Diagnosis not present

## 2018-06-09 DIAGNOSIS — I482 Chronic atrial fibrillation: Secondary | ICD-10-CM | POA: Diagnosis not present

## 2018-06-09 DIAGNOSIS — I714 Abdominal aortic aneurysm, without rupture, unspecified: Secondary | ICD-10-CM

## 2018-06-09 DIAGNOSIS — I4821 Permanent atrial fibrillation: Secondary | ICD-10-CM

## 2018-06-09 NOTE — Progress Notes (Signed)
Cardiology Office Note Date:  06/09/2018   ID:  Leroy Lara, DOB Apr 10, 1946, MRN [id number]  PCP:  Richmond Campbell., PA-C  Cardiologist:  [name], MD    Chief Complaint  Patient presents with  . Atrial Fibrillation     History of Present Illness: Leroy Lara is a 72 y.o. male who presents for follow-up evaluation.  The patient is permanent atrial fibrillation, abdominal aortic aneurysm, and hypertension.  He is here alone today.  Feeling relatively well.  He is concerned because he is been told he has a very low heart rate.  His heart rate was 41 bpm when it was checked recently.  He does have some dizziness that occurs with postural changes.  He has had no exertional lightheadedness, near syncope, chest pain, or chest pressure.  Shortness of breath, heart palpitations, orthopnea, or PND.  States that he generally feels better when he is able to be more active.   Past Medical History:  Diagnosis Date  . Essential hypertension   . Gastroesophageal reflux disease     History reviewed. No pertinent surgical history.  Current Outpatient Medications  Medication Sig Dispense Refill  . apixaban (ELIQUIS) 5 MG TABS tablet Take 1 tablet (5 mg total) by mouth 2 (two) times daily. 60 tablet 11  . atorvastatin (LIPITOR) 10 MG tablet Take 1 tablet (10 mg total) by mouth daily. 30 tablet 11  . losartan-hydrochlorothiazide (HYZAAR) 100-25 MG tablet Take 1 tablet by mouth daily. 30 tablet 11  . omeprazole (PRILOSEC) 40 MG capsule Take 0.5 capsules by mouth daily.      No current facility-administered medications for this visit.     Allergies:   Sulfamethoxazole and Sulfonamide derivatives   Social History:  The patient  reports that he has quit smoking. He has never used smokeless tobacco.   Family History:  The patient's family history includes Cancer - Lung in his father; Coronary artery disease (age of onset: 47) in his mother; Heart failure in his sister; Lymphoma (age of  onset: 79) in his brother; Pulmonary fibrosis in his brother.    ROS:  Please see the history of present illness.  All other systems are reviewed and negative.    PHYSICAL EXAM: VS:  BP 140/80   Pulse (!) 46   Ht 6' (1.829 m)   Wt 201 lb (91.2 kg)   SpO2 95%   BMI 27.26 kg/m  , BMI Body mass index is 27.26 kg/m. GEN: Well nourished, well developed, in no acute distress  HEENT: normal  Neck: no JVD, no masses. No carotid bruits Cardiac: bradycardic and irregular without murmur or gallop                Respiratory:  clear to auscultation bilaterally, normal work of breathing GI: soft, nontender, nondistended, + BS MS: no deformity or atrophy  Ext: no pretibial edema, pedal pulses 2+= bilaterally Skin: warm and dry, no rash Neuro:  Strength and sensation are intact Psych: euthymic mood, full affect  EKG:  EKG is not ordered today.  Recent Labs: 11/27/2017: ALT 18; BUN 17; Creatinine, Ser 0.89; Potassium 4.1; Sodium 136   Lipid Panel     Component Value Date/Time   CHOL 190 11/27/2017 0823   TRIG 140 11/27/2017 0823   HDL 56 11/27/2017 0823   CHOLHDL 3.4 11/27/2017 0823   CHOLHDL 3.0 01/10/2016 0759   VLDL 19 01/10/2016 0759   LDLCALC 106 (H) 11/27/2017 0823   LDLDIRECT 154.8 08/26/2013 0739  Wt Readings from Last 3 Encounters:  06/09/18 201 lb (91.2 kg)  12/06/17 204 lb 3.2 oz (92.6 kg)  06/10/17 201 lb 1.9 oz (91.2 kg)     Cardiac Studies Reviewed: Echo 12-07-2017: Study Conclusions  - Left ventricle: The cavity size was normal. Wall thickness was   increased in a pattern of moderate LVH. Systolic function was   normal. The estimated ejection fraction was in the range of 55%   to 60%. Incoordinate septal motion. The study is not technically   sufficient to allow evaluation of LV diastolic function. - Mitral valve: Mildly thickened leaflets . There was trivial   regurgitation. - Left atrium: Moderately dilated. - Right atrium: Moderately dilated. -  Tricuspid valve: There was trivial regurgitation. - Pulmonary arteries: PA peak pressure: 26 mm Hg (S). - Inferior vena cava: The vessel was normal in size. The   respirophasic diameter changes were in the normal range (= 50%),   consistent with normal central venous pressure.  Impressions:  - Compared to a prior study in 08/2016, the LVEF is higher at   55-60%. A-fib is noted and there is moderate biatrial enlargment.  ASSESSMENT AND PLAN: 1.  Permanent atrial fibrillation: Patient with slow ventricular response.  He is tolerating apixaban for anticoagulation.  Because of his slow ventricular response, we will check an exercise treadmill for chronotropic response as well as a 24-hour Holter monitor.  I will see him back in 6 months.  He is counseled regarding potential symptoms of bradycardia.  2.  Hypertension: Borderline blood pressure control.  He is treated with losartan hydrochlorothiazide.  We will continue to follow.  May consider addition of amlodipine later.  3.  Hyperlipidemia: Treated with atorvastatin.  Most recent with an LDL cholesterol of 106 mg/dL.  Dose is limited by side effect.    4.  Abdominal aortic aneurysm: Most recent Doppler study reviewed with maximum abdominal aortic dimension 3.1 m.  This is stable from previous findings.  Current medicines are reviewed with the patient today.  The patient does not have concerns regarding medicines.  Labs/ tests ordered today include:  No orders of the defined types were placed in this encounter.   Disposition:   FU 6 months  Signed, Tonny BollmanMichael Renae Mottley, MD  06/09/2018 2:08 PM    [facility] [address], [place], [place]  [id number] Phone: [phone](336) [phone]; Fax: [phone]) [phone]

## 2018-06-09 NOTE — Patient Instructions (Signed)
Medication Instructions:  Your provider recommends that you continue on your current medications as directed. Please refer to the Current Medication list given to you today.    Labwork: None  Testing/Procedures: Your physician has recommended that you wear a holter monitor. Holter monitors are medical devices that record the heart's electrical activity. Doctors most often use these monitors to diagnose arrhythmias. Arrhythmias are problems with the speed or rhythm of the heartbeat. The monitor is a small, portable device. You can wear one while you do your normal daily activities. This is usually used to diagnose what is causing palpitations/syncope (passing out).  Your provider has requested that you have an exercise tolerance test. For further information please visit [email or web address] Please also follow instruction sheet, as given.   Follow-Up: Your provider wants you to follow-up in: 6 months with Dr. Excell Seltzerooper. You will receive a reminder letter in the mail two months in advance. If you don't receive a letter, please call our office to schedule the follow-up appointment.    Any Other Special Instructions Will Be Listed Below (If Applicable).     If you need a refill on your cardiac medications before your next appointment, please call your pharmacy.

## 2018-06-16 ENCOUNTER — Ambulatory Visit (INDEPENDENT_AMBULATORY_CARE_PROVIDER_SITE_OTHER): Payer: Medicare Other | Admitting: Orthopedic Surgery

## 2018-06-16 ENCOUNTER — Encounter (INDEPENDENT_AMBULATORY_CARE_PROVIDER_SITE_OTHER): Payer: Self-pay | Admitting: Orthopedic Surgery

## 2018-06-16 VITALS — Ht 72.0 in | Wt 201.0 lb

## 2018-06-16 DIAGNOSIS — G5602 Carpal tunnel syndrome, left upper limb: Secondary | ICD-10-CM

## 2018-06-16 NOTE — Progress Notes (Signed)
Office Visit Note   Patient: Leroy Lara           Date of Birth: 1946/06/17           MRN: [id number] Visit Date: 06/16/2018              Requested by: Richmond CampbellKaplan, Kristen W., PA-C [address] [place], [place] [address] PCP: Richmond CampbellKaplan, Kristen W., PA-C  Chief Complaint  Patient presents with  . Left Hand - Numbness  . Right Elbow - Pain      HPI: Patient is a 72 year old gentleman who presents complaining of carpal tunnel symptoms in the left hand.  Patient states it started off with numbness and tingling in his fingers that woke him up at night he states that his progress to the point now where he has weakness in his hand and numbness and tingling all the time.  Assessment & Plan: Visit Diagnoses:  1. Carpal tunnel syndrome of left wrist     Plan: Discussed that we will set up nerve conduction studies and then can set up for open carpal tunnel release.  We can set this up at patient's convenience.  Patient will use anti-inflammatories for the medial epicondylitis of the right elbow and recommend continue core strengthening for his sciatic symptoms.  Follow-Up Instructions: Return if symptoms worsen or fail to improve.   Ortho Exam  Patient is alert, oriented, no adenopathy, well-dressed, normal affect, normal respiratory effort. Examination patient has decreased grip strength in the left compared to the right.  Has a positive Phalen's positive Tinel's test.  He has numbness and tingling into the median nerve distribution of the little and ring finger are unaffected.  Patient has medial epicondylitis on the right which is tender to palpation.  Patient still has sciatic symptoms on the right which she states is better with yoga exercises.  Imaging: No results found. No images are attached to the encounter.  Labs: No results found for: HGBA1C, ESRSEDRATE, CRP, LABURIC, REPTSTATUS, GRAMSTAIN, CULT, LABORGA   Lab Results  Component Value Date   ALBUMIN 3.9 11/27/2017   ALBUMIN 3.9 11/16/2016   ALBUMIN 3.8 01/10/2016    Body mass index is 27.26 kg/m.  Orders:  No orders of the defined types were placed in this encounter.  No orders of the defined types were placed in this encounter.    Procedures: No procedures performed  Clinical Data: No additional findings.  ROS:  All other systems negative, except as noted in the HPI. Review of Systems  Objective: Vital Signs: Ht 6' (1.829 m)   Wt 201 lb (91.2 kg)   BMI 27.26 kg/m   Specialty Comments:  No specialty comments available.  PMFS History: Patient Active Problem List   Diagnosis Date Noted  . Carpal tunnel syndrome of left wrist 06/16/2018  . Right hip pain 01/29/2017  . Left hip pain 01/29/2017  . Abnormal EKG 01/28/2012  . Hypertension, benign 01/28/2012  . Right carotid bruit 01/28/2012   Past Medical History:  Diagnosis Date  . Essential hypertension   . Gastroesophageal reflux disease     Family History  Problem Relation Age of Onset  . Coronary artery disease Mother 6270       myocardial infarction  . Cancer - Lung Father   . Heart failure Sister   . Pulmonary fibrosis Brother   . Lymphoma Brother 37    History reviewed. No pertinent surgical history. Social History   Occupational History  . Not on file  Tobacco Use  . Smoking status: Former Games developer  . Smokeless tobacco: Never Used  Substance and Sexual Activity  . Alcohol use: Not on file  . Drug use: Not on file  . Sexual activity: Not on file

## 2018-06-19 ENCOUNTER — Ambulatory Visit (INDEPENDENT_AMBULATORY_CARE_PROVIDER_SITE_OTHER): Payer: Medicare Other

## 2018-06-19 DIAGNOSIS — R001 Bradycardia, unspecified: Secondary | ICD-10-CM

## 2018-06-19 LAB — EXERCISE TOLERANCE TEST
CHL CUP MPHR: 148 {beats}/min
CHL CUP RESTING HR STRESS: 72 {beats}/min
CSEPEDS: 50 s
CSEPPHR: 134 {beats}/min
Estimated workload: 5.6 METS
Exercise duration (min): 3 min
Percent HR: 90 %
RPE: 15

## 2018-06-25 ENCOUNTER — Ambulatory Visit (INDEPENDENT_AMBULATORY_CARE_PROVIDER_SITE_OTHER): Payer: Medicare Other | Admitting: Physical Medicine and Rehabilitation

## 2018-06-25 ENCOUNTER — Encounter (INDEPENDENT_AMBULATORY_CARE_PROVIDER_SITE_OTHER): Payer: Self-pay | Admitting: Physical Medicine and Rehabilitation

## 2018-06-25 DIAGNOSIS — R202 Paresthesia of skin: Secondary | ICD-10-CM | POA: Diagnosis not present

## 2018-06-25 NOTE — Progress Notes (Signed)
  Numeric Pain Rating Scale and Functional Assessment Average Pain 8   In the last MONTH (on 0-10 scale) has pain interfered with the following?  1. General activity like being  able to carry out your everyday physical activities such as walking, climbing stairs, carrying groceries, or moving a chair?  Rating(6)     

## 2018-06-25 NOTE — Progress Notes (Signed)
DECARLO RIVET - 72 y.o. male MRN [id number]  Date of birth: July 19, 1946  Office Visit Note: Visit Date: 06/25/2018 PCP: Richmond Campbell., PA-C Referred by: Richmond Campbell., PA-C  Subjective: Chief Complaint  Patient presents with  . Left Hand - Pain, Numbness, Tingling   HPI: Mr. Winningham is a 72 year old right-hand-dominant gentleman comes in today at the request of Dr. Aldean Baker for electrodiagnostic study of the left upper extremity.  He reports significant 3 to 4 months worsening of the left hand pain and numbness particularly in the thumb and first digit.  He gets sharp shooting pains into the thenar eminence and he gets a lot of numbness in the tip of the thumb on the left.  He reports that bending his arm and holding his left hand up seems to make it worse.  If he sleeps on the right side it does seem to help he does get tunnel complaints.  He denies any right-handed symptoms.  He denies any frank radicular symptoms down the arm.  He has had a history of neck pain.  Seen in the past for lumbar spine issues that is been a while.  He has had no prior electrodiagnostic studies.  He does use his hands extensively at work.   ROS Otherwise per HPI.  Assessment & Plan: Visit Diagnoses:  1. Paresthesia of skin     Plan: No additional findings.  Impression: The above electrodiagnostic study is ABNORMAL and reveals evidence of a severe left median nerve entrapment at the wrist (carpal tunnel syndrome) affecting sensory and motor components. The lesion is characterized by sensory and motor demyelination with evidence of axonal injury.   There is no significant electrodiagnostic evidence of any other focal nerve entrapment or brachial plexopathy.   Recommendations: 1.  Follow-up with referring physician. 2.  Continue current management of symptoms. 3.  Suggest surgical evaluation.   Meds & Orders: No orders of the defined types were placed in this encounter.   Orders Placed This  Encounter  Procedures  . NCV with EMG (electromyography)    Follow-up: Return for  Aldean Baker, M.D..   Procedures: No procedures performed  EMG & NCV Findings: Evaluation of the left median motor nerve showed prolonged distal onset latency (7.0 ms), reduced amplitude (2.9 mV), and decreased conduction velocity (Elbow-Wrist, 46 m/s).  The left median (across palm) sensory nerve showed prolonged distal peak latency (Wrist, 6.7 ms), reduced amplitude (3.9 V), and prolonged distal peak latency (Palm, 2.5 ms).  All remaining nerves (as indicated in the following tables) were within normal limits.    Needle evaluation of the left abductor pollicis brevis muscle showed moderately increased spontaneous activity.  All remaining muscles (as indicated in the following table) showed no evidence of electrical instability.    Impression: The above electrodiagnostic study is ABNORMAL and reveals evidence of a severe left median nerve entrapment at the wrist (carpal tunnel syndrome) affecting sensory and motor components. The lesion is characterized by sensory and motor demyelination with evidence of axonal injury.   There is no significant electrodiagnostic evidence of any other focal nerve entrapment or brachial plexopathy.   Recommendations: 1.  Follow-up with referring physician. 2.  Continue current management of symptoms. 3.  Suggest surgical evaluation.  ___________________________ Elease Hashimoto Board Certified, American Board of Physical Medicine and Rehabilitation    Nerve Conduction Studies Anti Sensory Summary Table   Stim Site NR Peak (ms) Norm Peak (ms) P-T Amp (V) Norm P-T Amp Site1  Site2 Delta-P (ms) Dist (cm) Vel (m/s) Norm Vel (m/s)  Left Median Acr Palm Anti Sensory (2nd Digit)  33.1C  Wrist    *6.7 <3.6 *3.9 >10 Wrist Palm 4.2 0.0    Palm    *2.5 <2.0 4.7         Left Radial Anti Sensory (Base 1st Digit)  32.8C  Wrist    2.3 <3.1 14.4  Wrist Base 1st Digit 2.3 0.0      Left Ulnar Anti Sensory (5th Digit)  33.4C  Wrist    3.4 <3.7 17.5 >15.0 Wrist 5th Digit 3.4 14.0 41 >38   Motor Summary Table   Stim Site NR Onset (ms) Norm Onset (ms) O-P Amp (mV) Norm O-P Amp Site1 Site2 Delta-0 (ms) Dist (cm) Vel (m/s) Norm Vel (m/s)  Left Median Motor (Abd Poll Brev)  33C  Wrist    *7.0 <4.2 *2.9 >5 Elbow Wrist 5.0 23.0 *46 >50  Elbow    12.0  2.8         Left Ulnar Motor (Abd Dig Min)  33.1C  Wrist    3.0 <4.2 7.3 >3 B Elbow Wrist 4.0 23.0 58 >53  B Elbow    7.0  7.0  A Elbow B Elbow 1.5 9.5 63 >53  A Elbow    8.5  7.0          EMG   Side Muscle Nerve Root Ins Act Fibs Psw Amp Dur Poly Recrt Int [name] Comment  Left Abd Poll Brev Median C8-T1 Nml *2+ *2+ Nml Nml 0 Nml Nml   Left 1stDorInt Ulnar C8-T1 Nml Nml Nml Nml Nml 0 Nml Nml     Nerve Conduction Studies Anti Sensory Left/Right Comparison   Stim Site L Lat (ms) R Lat (ms) L-R Lat (ms) L Amp (V) R Amp (V) L-R Amp (%) Site1 Site2 L Vel (m/s) R Vel (m/s) L-R Vel (m/s)  Median Acr Palm Anti Sensory (2nd Digit)  33.1C  Wrist *6.7   *3.9   Wrist Palm     Palm *2.5   4.7         Radial Anti Sensory (Base 1st Digit)  32.8C  Wrist 2.3   14.4   Wrist Base 1st Digit     Ulnar Anti Sensory (5th Digit)  33.4C  Wrist 3.4   17.5   Wrist 5th Digit 41     Motor Left/Right Comparison   Stim Site L Lat (ms) R Lat (ms) L-R Lat (ms) L Amp (mV) R Amp (mV) L-R Amp (%) Site1 Site2 L Vel (m/s) R Vel (m/s) L-R Vel (m/s)  Median Motor (Abd Poll Brev)  33C  Wrist *7.0   *2.9   Elbow Wrist *46    Elbow 12.0   2.8         Ulnar Motor (Abd Dig Min)  33.1C  Wrist 3.0   7.3   B Elbow Wrist 58    B Elbow 7.0   7.0   A Elbow B Elbow 63    A Elbow 8.5   7.0            Waveforms:            Clinical History: No specialty comments available.   He reports that he has quit smoking. He has never used smokeless tobacco. No results for input(s): HGBA1C, LABURIC in the last 8760 hours.  Objective:  VS:  HT:    WT:    BMI:  BP:   HR: bpm  TEMP: ( )  RESP:  Physical Exam  Musculoskeletal:  Inspection reveals flattening of the left APB compared to right but no atrophy of the bilateral FDI or hand intrinsics. There is no swelling, color changes, allodynia or dystrophic changes. There is 5 out of 5 strength in the bilateral wrist extension, finger abduction and long finger flexion.  There is decreased sensation over the left thumb compared to right.  There is a negative Hoffmann's test bilaterally.    Ortho Exam Imaging: No results found.  Past Medical/Family/Surgical/Social History: Medications & Allergies reviewed per EMR, new medications updated. Patient Active Problem List   Diagnosis Date Noted  . Carpal tunnel syndrome of left wrist 06/16/2018  . Right hip pain 01/29/2017  . Left hip pain 01/29/2017  . Abnormal EKG 01/28/2012  . Hypertension, benign 01/28/2012  . Right carotid bruit 01/28/2012   Past Medical History:  Diagnosis Date  . Essential hypertension   . Gastroesophageal reflux disease    Family History  Problem Relation Age of Onset  . Coronary artery disease Mother 3970       myocardial infarction  . Cancer - Lung Father   . Heart failure Sister   . Pulmonary fibrosis Brother   . Lymphoma Brother 37   History reviewed. No pertinent surgical history. Social History   Occupational History  . Not on file  Tobacco Use  . Smoking status: Former Games developermoker  . Smokeless tobacco: Never Used  Substance and Sexual Activity  . Alcohol use: Not on file  . Drug use: Not on file  . Sexual activity: Not on file

## 2018-06-25 NOTE — Procedures (Signed)
EMG & NCV Findings: Evaluation of the left median motor nerve showed prolonged distal onset latency (7.0 ms), reduced amplitude (2.9 mV), and decreased conduction velocity (Elbow-Wrist, 46 m/s).  The left median (across palm) sensory nerve showed prolonged distal peak latency (Wrist, 6.7 ms), reduced amplitude (3.9 V), and prolonged distal peak latency (Palm, 2.5 ms).  All remaining nerves (as indicated in the following tables) were within normal limits.    Needle evaluation of the left abductor pollicis brevis muscle showed moderately increased spontaneous activity.  All remaining muscles (as indicated in the following table) showed no evidence of electrical instability.    Impression: The above electrodiagnostic study is ABNORMAL and reveals evidence of a severe left median nerve entrapment at the wrist (carpal tunnel syndrome) affecting sensory and motor components. The lesion is characterized by sensory and motor demyelination with evidence of axonal injury.   There is no significant electrodiagnostic evidence of any other focal nerve entrapment or brachial plexopathy.   Recommendations: 1.  Follow-up with referring physician. 2.  Continue current management of symptoms. 3.  Suggest surgical evaluation.  ___________________________ Leroy Lara FAAPMR Board Certified, American Board of Physical Medicine and Rehabilitation    Nerve Conduction Studies Anti Sensory Summary Table   Stim Site NR Peak (ms) Norm Peak (ms) P-T Amp (V) Norm P-T Amp Site1 Site2 Delta-P (ms) Dist (cm) Vel (m/s) Norm Vel (m/s)  Left Median Acr Palm Anti Sensory (2nd Digit)  33.1C  Wrist    *6.7 <3.6 *3.9 >10 Wrist Palm 4.2 0.0    Palm    *2.5 <2.0 4.7         Left Radial Anti Sensory (Base 1st Digit)  32.8C  Wrist    2.3 <3.1 14.4  Wrist Base 1st Digit 2.3 0.0    Left Ulnar Anti Sensory (5th Digit)  33.4C  Wrist    3.4 <3.7 17.5 >15.0 Wrist 5th Digit 3.4 14.0 41 >38   Motor Summary Table   Stim Site NR  Onset (ms) Norm Onset (ms) O-P Amp (mV) Norm O-P Amp Site1 Site2 Delta-0 (ms) Dist (cm) Vel (m/s) Norm Vel (m/s)  Left Median Motor (Abd Poll Brev)  33C  Wrist    *7.0 <4.2 *2.9 >5 Elbow Wrist 5.0 23.0 *46 >50  Elbow    12.0  2.8         Left Ulnar Motor (Abd Dig Min)  33.1C  Wrist    3.0 <4.2 7.3 >3 B Elbow Wrist 4.0 23.0 58 >53  B Elbow    7.0  7.0  A Elbow B Elbow 1.5 9.5 63 >53  A Elbow    8.5  7.0          EMG   Side Muscle Nerve Root Ins Act Fibs Psw Amp Dur Poly Recrt Int Dennie BiblePat Comment  Left Abd Poll Brev Median C8-T1 Nml *2+ *2+ Nml Nml 0 Nml Nml   Left 1stDorInt Ulnar C8-T1 Nml Nml Nml Nml Nml 0 Nml Nml     Nerve Conduction Studies Anti Sensory Left/Right Comparison   Stim Site L Lat (ms) R Lat (ms) L-R Lat (ms) L Amp (V) R Amp (V) L-R Amp (%) Site1 Site2 L Vel (m/s) R Vel (m/s) L-R Vel (m/s)  Median Acr Palm Anti Sensory (2nd Digit)  33.1C  Wrist *6.7   *3.9   Wrist Palm     Palm *2.5   4.7         Radial Anti Sensory (Base 1st Digit)  32.8C  Wrist 2.3   14.4   Wrist Base 1st Digit     Ulnar Anti Sensory (5th Digit)  33.4C  Wrist 3.4   17.5   Wrist 5th Digit 41     Motor Left/Right Comparison   Stim Site L Lat (ms) R Lat (ms) L-R Lat (ms) L Amp (mV) R Amp (mV) L-R Amp (%) Site1 Site2 L Vel (m/s) R Vel (m/s) L-R Vel (m/s)  Median Motor (Abd Poll Brev)  33C  Wrist *7.0   *2.9   Elbow Wrist *46    Elbow 12.0   2.8         Ulnar Motor (Abd Dig Min)  33.1C  Wrist 3.0   7.3   B Elbow Wrist 58    B Elbow 7.0   7.0   A Elbow B Elbow 63    A Elbow 8.5   7.0            Waveforms:

## 2018-07-14 ENCOUNTER — Ambulatory Visit (INDEPENDENT_AMBULATORY_CARE_PROVIDER_SITE_OTHER): Payer: Medicare Other | Admitting: Orthopedic Surgery

## 2018-07-14 ENCOUNTER — Encounter (INDEPENDENT_AMBULATORY_CARE_PROVIDER_SITE_OTHER): Payer: Self-pay | Admitting: Orthopedic Surgery

## 2018-07-14 VITALS — Ht 72.0 in | Wt 201.0 lb

## 2018-07-14 DIAGNOSIS — G5602 Carpal tunnel syndrome, left upper limb: Secondary | ICD-10-CM | POA: Diagnosis not present

## 2018-07-23 ENCOUNTER — Encounter (INDEPENDENT_AMBULATORY_CARE_PROVIDER_SITE_OTHER): Payer: Self-pay | Admitting: Orthopedic Surgery

## 2018-07-23 NOTE — Progress Notes (Signed)
Office Visit Note   Patient: Leroy LaudJoseph T Tarman           Date of Birth: 09/30/1946           MRN: [id number] Visit Date: 07/14/2018              Requested by: Richmond CampbellKaplan, Kristen W., PA-C [address] [place], [place] [address] PCP: Richmond CampbellKaplan, Kristen W., PA-C  Chief Complaint  Patient presents with  . Left Wrist - Follow-up    S/p NCS with FN      HPI: Patient is a 72 year old gentleman who presents in follow-up status post nerve conduction studies with weakness and numbness and tingling in the left hand which he states involves mostly the thumb index and long finger.  Assessment & Plan: Visit Diagnoses:  1. Carpal tunnel syndrome of left wrist     Plan: Discussed with patient with his nerve conduction studies showing severe median nerve entrapment as well as his thenar wasting and weakness and pain recommended proceeding with carpal tunnel release.  Discussed that his motor strength and symptoms may not recur due to the severity of the entrapment.  Risks and benefits of surgery were discussed patient states he understands and wishes to proceed with surgery at this time.  Follow-Up Instructions: Return in about 2 weeks (around 07/28/2018).   Ortho Exam  Patient is alert, oriented, no adenopathy, well-dressed, normal affect, normal respiratory effort. Objective patient has thenar wasting on the left hand compared to the right he has grip strength weakness on the left compared to the right he has decreased sensation of the thumb index and long finger.  Review of the nerve conduction studies shows severe left median nerve entrapment at the wrist.  Imaging: No results found. No images are attached to the encounter.  Labs: No results found for: HGBA1C, ESRSEDRATE, CRP, LABURIC, REPTSTATUS, GRAMSTAIN, CULT, LABORGA   Lab Results  Component Value Date   ALBUMIN 3.9 11/27/2017   ALBUMIN 3.9 11/16/2016   ALBUMIN 3.8 01/10/2016    Body mass index is 27.26 kg/m.  Orders:  No  orders of the defined types were placed in this encounter.  No orders of the defined types were placed in this encounter.    Procedures: No procedures performed  Clinical Data: No additional findings.  ROS:  All other systems negative, except as noted in the HPI. Review of Systems  Objective: Vital Signs: Ht 6' (1.829 m)   Wt 201 lb (91.2 kg)   BMI 27.26 kg/m   Specialty Comments:  No specialty comments available.  PMFS History: Patient Active Problem List   Diagnosis Date Noted  . Carpal tunnel syndrome of left wrist 06/16/2018  . Right hip pain 01/29/2017  . Left hip pain 01/29/2017  . Abnormal EKG 01/28/2012  . Hypertension, benign 01/28/2012  . Right carotid bruit 01/28/2012   Past Medical History:  Diagnosis Date  . Essential hypertension   . Gastroesophageal reflux disease     Family History  Problem Relation Age of Onset  . Coronary artery disease Mother 3170       myocardial infarction  . Cancer - Lung Father   . Heart failure Sister   . Pulmonary fibrosis Brother   . Lymphoma Brother 37    History reviewed. No pertinent surgical history. Social History   Occupational History  . Not on file  Tobacco Use  . Smoking status: Former Games developermoker  . Smokeless tobacco: Never Used  Substance and Sexual Activity  .  Alcohol use: Not on file  . Drug use: Not on file  . Sexual activity: Not on file

## 2018-08-07 ENCOUNTER — Ambulatory Visit (INDEPENDENT_AMBULATORY_CARE_PROVIDER_SITE_OTHER): Payer: Medicare Other | Admitting: Physician Assistant

## 2018-08-07 ENCOUNTER — Encounter (INDEPENDENT_AMBULATORY_CARE_PROVIDER_SITE_OTHER): Payer: Self-pay | Admitting: Orthopedic Surgery

## 2018-08-07 DIAGNOSIS — Z9889 Other specified postprocedural states: Secondary | ICD-10-CM | POA: Diagnosis not present

## 2018-08-07 NOTE — Progress Notes (Signed)
   Office Visit Note   Patient: Leroy Lara           Date of Birth: 1945/12/30           MRN: [id number] Visit Date: 08/07/2018              Requested by: Richmond CampbellKaplan, Kristen W., PA-C [address] [place], [place] [address] PCP: Richmond CampbellKaplan, Kristen W., PA-C   Assessment & Plan: Visit Diagnoses:  1. Status post carpal tunnel release     Plan: Plan to harvest the sutures today.  Patient can move and use his left hand wrist forearm as tolerated over the next several weeks.  He will follow-up in 3 weeks for recheck.  Follow-Up Instructions: Return in about 3 weeks (around 08/28/2018).   Orders:  No orders of the defined types were placed in this encounter.  No orders of the defined types were placed in this encounter.     Procedures: No procedures performed   Clinical Data: No additional findings.   Subjective: Chief Complaint  Patient presents with  . Left Wrist - Routine Post Op    HPI Patient is a 72 year old male who is seen for postoperative follow-up following carpal tunnel release of his left wrist on 07/29/2018.  He reports that he has been doing well at home.  He has had little pain.  He has been washing the incisional area and keeping it dry.  He has not been wearing any bandages to the wrist.  He reports his pain is much improved. Review of Systems   Objective: Vital Signs: There were no vitals taken for this visit.  Physical Exam Developed well-nourished elderly male who is alert and oriented and appropriate.  He appears well-groomed. Ortho Exam Examination of the left wrist shows a well-healed incision.  No edema.  No erythema or other signs of cellulitis. Specialty Comments:  No specialty comments available.  Imaging: No results found.   PMFS History: Patient Active Problem List   Diagnosis Date Noted  . Carpal tunnel syndrome of left wrist 06/16/2018  . Right hip pain 01/29/2017  . Left hip pain 01/29/2017  . Abnormal EKG 01/28/2012  .  Hypertension, benign 01/28/2012  . Right carotid bruit 01/28/2012   Past Medical History:  Diagnosis Date  . Essential hypertension   . Gastroesophageal reflux disease     Family History  Problem Relation Age of Onset  . Coronary artery disease Mother 7370       myocardial infarction  . Cancer - Lung Father   . Heart failure Sister   . Pulmonary fibrosis Brother   . Lymphoma Brother 37    History reviewed. No pertinent surgical history. Social History   Occupational History  . Not on file  Tobacco Use  . Smoking status: Former Games developermoker  . Smokeless tobacco: Never Used  Substance and Sexual Activity  . Alcohol use: Not on file  . Drug use: Not on file  . Sexual activity: Not on file

## 2018-08-28 ENCOUNTER — Encounter (INDEPENDENT_AMBULATORY_CARE_PROVIDER_SITE_OTHER): Payer: Self-pay | Admitting: Orthopedic Surgery

## 2018-08-28 ENCOUNTER — Ambulatory Visit (INDEPENDENT_AMBULATORY_CARE_PROVIDER_SITE_OTHER): Payer: Medicare Other | Admitting: Physician Assistant

## 2018-08-28 DIAGNOSIS — Z9889 Other specified postprocedural states: Secondary | ICD-10-CM

## 2018-08-28 NOTE — Progress Notes (Signed)
Office Visit Note   Patient: Leroy Lara           Date of Birth: [birth year]           MRN: [id number] Visit Date: 08/28/2018              Requested by: Richmond Campbell., PA-C [address], [place] [address] PCP: Richmond Campbell., PA-C  Chief Complaint  Patient presents with  . Left Wrist - Routine Post Op      HPI: Patient is a 72 year old male who had left carpal tunnel release 4 weeks ago.  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  He reports that this is primarily when lifting heavier items or twisting such as trying to open a jar.  We discussed that this was consistent with his current postoperative course and that he should continue to let his pain a discomfort guide his level of activity.  He has been doing scar massage to the incisional area and we recommended that he continue with this at least several times daily.  He does report some residual numbness in the thumb but overall his symptoms are much improved with regards to the numbness and tingling.  The hand is not keeping him awake at night any longer.  He reports he is overall very pleased with his progress.  Assessment & Plan: Visit Diagnoses:  1. Status post carpal tunnel release     Plan: Patient was instructed to continue with scar massage.  Again reassurance was provided that the patient is continuing to do well.  We did recommend that he continue to let his pain or discomfort level will be his guide to activities.  He will follow-up here in 4 weeks or sooner should he have any difficulties in the interim.  Follow-Up Instructions: Return in about 4 weeks (around 09/25/2018).   Ortho Exam  Patient is alert, oriented, no adenopathy, well-dressed, normal affect, normal respiratory effort. Left wrist incision is healing well with minimal firm scar.  He continues to have  some mild decreased sensation over the index and long finger but this is improved from preoperatively.  He still has decreased sensation about the distal thumb.  His strength is improving.  Imaging: No results found. No images are attached to the encounter.  Labs: No results found for: HGBA1C, ESRSEDRATE, CRP, LABURIC, REPTSTATUS, GRAMSTAIN, CULT, LABORGA   Lab Results  Component Value Date   ALBUMIN 3.9 11/27/2017   ALBUMIN 3.9 11/16/2016   ALBUMIN 3.8 01/10/2016    There is no height or weight on file to calculate BMI.  Orders:  No orders of the defined types were placed in this encounter.  No orders of the defined types were placed in this encounter.    Procedures: No procedures performed  Clinical Data: No additional findings.  ROS:  All other systems negative, except as noted in the HPI. Review of Systems  Objective: Vital Signs: There were no vitals taken for this visit.  Specialty Comments:  No specialty comments available.  PMFS History: Patient Active Problem List   Diagnosis Date Noted  . Carpal tunnel syndrome of left wrist 06/16/2018  . Right hip pain 01/29/2017  . Left hip pain 01/29/2017  . Abnormal EKG 01/28/2012  . Hypertension, benign 01/28/2012  . Right carotid bruit 01/28/2012   Past Medical History:  Diagnosis Date  . Essential hypertension   . Gastroesophageal reflux disease     Family History  Problem Relation Age of Onset  . Coronary artery disease Mother 5070       myocardial infarction  . Cancer - Lung Father   . Heart failure Sister   . Pulmonary fibrosis Brother   . Lymphoma Brother 37    History reviewed. No pertinent surgical history. Social History   Occupational History  . Not on file  Tobacco Use  . Smoking status: Former Games developermoker  . Smokeless tobacco: Never Used  Substance and Sexual Activity  . Alcohol use: Not on file  . Drug use: Not on file  . Sexual activity: Not on file

## 2018-09-29 ENCOUNTER — Encounter (INDEPENDENT_AMBULATORY_CARE_PROVIDER_SITE_OTHER): Payer: Self-pay | Admitting: Orthopedic Surgery

## 2018-09-29 ENCOUNTER — Ambulatory Visit (INDEPENDENT_AMBULATORY_CARE_PROVIDER_SITE_OTHER): Payer: Medicare Other

## 2018-09-29 ENCOUNTER — Ambulatory Visit (INDEPENDENT_AMBULATORY_CARE_PROVIDER_SITE_OTHER): Payer: Medicare Other | Admitting: Orthopedic Surgery

## 2018-09-29 VITALS — Ht 72.0 in | Wt 201.0 lb

## 2018-09-29 DIAGNOSIS — M79641 Pain in right hand: Secondary | ICD-10-CM

## 2018-09-29 DIAGNOSIS — Z9889 Other specified postprocedural states: Secondary | ICD-10-CM

## 2018-09-30 ENCOUNTER — Encounter (INDEPENDENT_AMBULATORY_CARE_PROVIDER_SITE_OTHER): Payer: Self-pay | Admitting: Orthopedic Surgery

## 2018-09-30 NOTE — Progress Notes (Signed)
Office Visit Note   Patient: Leroy Lara           Date of Birth: Jan 13, 1946           MRN: [id number] Visit Date: 09/29/2018              Requested by: Richmond Campbell., PA-C [address], [place] [address] PCP: Richmond Campbell., PA-C  Chief Complaint  Patient presents with  . Left Wrist - Routine Post Op    S/P carpal tunnel Left wrist  . Right Hand - Pain    Bent finger back in Aug on Right hand      HPI: Patient is a 72 year old gentleman who presents with 2 separate issues.  Patient states he is status post carpal tunnel release on the left.  Patient states he has been playing tennis his strength is better he is working on scar massage and is pleased with his progress.  Patient states that while playing tennis he got his right index finger caught in the net and sustained a valgus stress to the finger and has been having persistent pain at the MCP joint.  Assessment & Plan: Visit Diagnoses:  1. Pain in right hand   2. Status post carpal tunnel release     Plan: Patient will continue with scar massage and continue with increasing his activities as tolerated for the left wrist no restrictions.  On the right hand we will make a referral to Dr. Mina Marble at the hand center to see if any type of surgical intervention is necessary for the ulnar collateral ligament injury to the right index finger.  Follow-Up Instructions: Return if symptoms worsen or fail to improve.   Ortho Exam  Patient is alert, oriented, no adenopathy, well-dressed, normal affect, normal respiratory effort. Examination patient has improved grip strength in the left hand.    Right hand he has pain to palpation over the ulnar collateral ligament to the right index finger.  FDS and FDP are intact he has increased varus instability of the MCP joint to the index finger secondary to laxity of the ulnar collateral ligament.  He has full active extension.  Imaging: No results found. No images  are attached to the encounter.  Labs: No results found for: HGBA1C, ESRSEDRATE, CRP, LABURIC, REPTSTATUS, GRAMSTAIN, CULT, LABORGA   Lab Results  Component Value Date   ALBUMIN 3.9 11/27/2017   ALBUMIN 3.9 11/16/2016   ALBUMIN 3.8 01/10/2016    Body mass index is 27.26 kg/m.  Orders:  Orders Placed This Encounter  Procedures  . XR Hand Complete Right  . Ambulatory referral to Orthopedic Surgery   No orders of the defined types were placed in this encounter.    Procedures: No procedures performed  Clinical Data: No additional findings.  ROS:  All other systems negative, except as noted in the HPI. Review of Systems  Objective: Vital Signs: Ht 6' (1.829 m)   Wt 201 lb (91.2 kg)   BMI 27.26 kg/m   Specialty Comments:  No specialty comments available.  PMFS History: Patient Active Problem List   Diagnosis Date Noted  . Carpal tunnel syndrome of left wrist 06/16/2018  . Right hip pain 01/29/2017  . Left hip pain 01/29/2017  . Abnormal EKG 01/28/2012  . Hypertension, benign 01/28/2012  . Right carotid bruit 01/28/2012   Past Medical History:  Diagnosis Date  . Essential hypertension   . Gastroesophageal reflux disease     Family History  Problem  Relation Age of Onset  . Coronary artery disease Mother 28       myocardial infarction  . Cancer - Lung Father   . Heart failure Sister   . Pulmonary fibrosis Brother   . Lymphoma Brother 37    History reviewed. No pertinent surgical history. Social History   Occupational History  . Not on file  Tobacco Use  . Smoking status: Former Games developer  . Smokeless tobacco: Never Used  Substance and Sexual Activity  . Alcohol use: Not on file  . Drug use: Not on file  . Sexual activity: Not on file

## 2018-10-22 ENCOUNTER — Telehealth: Payer: Self-pay

## 2018-10-22 MED ORDER — HYDROCHLOROTHIAZIDE 25 MG PO TABS: 25.0000 mg | ORAL_TABLET | Freq: Every day | ORAL | 3 refills | Status: DC

## 2018-10-22 MED ORDER — LOSARTAN POTASSIUM 100 MG PO TABS: 100.0000 mg | ORAL_TABLET | Freq: Every day | ORAL | 3 refills | Status: DC

## 2018-10-22 NOTE — Telephone Encounter (Signed)
Received message from patient's pharmacy that Hyzaar is on back order and the patient is requesting to call each medication in separately.  Rx for losartan 100 mg daily and HCTZ 25 mg daily sent.  Message left of patient that new prescriptions have been called in and to call if he has any further questions.

## 2018-12-05 ENCOUNTER — Encounter: Payer: Self-pay | Admitting: Cardiovascular Disease

## 2018-12-11 ENCOUNTER — Other Ambulatory Visit: Payer: Self-pay | Admitting: Cardiovascular Disease

## 2018-12-11 DIAGNOSIS — I4891 Unspecified atrial fibrillation: Secondary | ICD-10-CM

## 2018-12-11 DIAGNOSIS — Z5181 Encounter for therapeutic drug level monitoring: Secondary | ICD-10-CM

## 2018-12-11 NOTE — Telephone Encounter (Signed)
Pt called back scheduled for CBC and BMP tomorrow 12/12/17, will await results and refill rx.

## 2018-12-11 NOTE — Telephone Encounter (Addendum)
Pt last saw Dr Excell Seltzer 11/27/17, las labs in chart are from 11/27/17, called pt's primary MD requested most recent labwork message sent back for nurse will await call back or fax.  Pt's weight is 91.2kg, age 73.   Pt's primary MD office called back, stating no recent labwork on pt.  Attempted to call pt to schedule labwork appt.  LMOM TCB.

## 2018-12-12 ENCOUNTER — Other Ambulatory Visit: Payer: Medicare Other | Admitting: *Deleted

## 2018-12-12 DIAGNOSIS — Z5181 Encounter for therapeutic drug level monitoring: Secondary | ICD-10-CM

## 2018-12-12 DIAGNOSIS — I4891 Unspecified atrial fibrillation: Secondary | ICD-10-CM

## 2018-12-12 LAB — CBC
HEMATOCRIT: 43.1 % (ref 37.5–51.0)
Hemoglobin: 14.4 g/dL (ref 13.0–17.7)
MCH: 32.6 pg (ref 26.6–33.0)
MCHC: 33.4 g/dL (ref 31.5–35.7)
MCV: 98 fL — ABNORMAL HIGH (ref 79–97)
Platelets: 247 10*3/uL (ref 150–450)
RBC: 4.42 x10E6/uL (ref 4.14–5.80)
RDW: 11.8 % — AB (ref 12.3–15.4)
WBC: 8.2 10*3/uL (ref 3.4–10.8)

## 2018-12-12 LAB — BASIC METABOLIC PANEL
BUN/Creatinine Ratio: 22 (ref 10–24)
BUN: 19 mg/dL (ref 8–27)
CHLORIDE: 99 mmol/L (ref 96–106)
CO2: 24 mmol/L (ref 20–29)
Calcium: 8.9 mg/dL (ref 8.6–10.2)
Creatinine, Ser: 0.88 mg/dL (ref 0.76–1.27)
GFR calc non Af Amer: 86 mL/min/{1.73_m2} (ref >59)
GFR, EST AFRICAN AMERICAN: 99 mL/min/{1.73_m2} (ref >59)
Glucose: 87 mg/dL (ref 65–99)
POTASSIUM: 4.1 mmol/L (ref 3.5–5.2)
Sodium: 139 mmol/L (ref 134–144)

## 2018-12-19 NOTE — Telephone Encounter (Signed)
Pt is a 73 yr old fmale who saw Dr Excell Seltzer on 06/09/18. Last noted weight was 91.2Kg on 09/29/18. SCr on 12/11/2018 was 0.88, will refill Eliquis 5mg  BID.

## 2018-12-24 ENCOUNTER — Telehealth (INDEPENDENT_AMBULATORY_CARE_PROVIDER_SITE_OTHER): Payer: Self-pay | Admitting: Orthopedic Surgery

## 2018-12-24 NOTE — Telephone Encounter (Signed)
Patient left a message stating that he has not heard anything from the Hand Specialist Dr. Lajoyce Cornersuda was suppose to refer him to.  CB#[phone].  Thank you.

## 2018-12-25 ENCOUNTER — Telehealth (INDEPENDENT_AMBULATORY_CARE_PROVIDER_SITE_OTHER): Payer: Self-pay

## 2018-12-25 NOTE — Telephone Encounter (Signed)
I called and spoke to referral scheduler about patients appt to referral physician and she stated she would be sending over another referral today for a Hand Specialist to see patient. Patient was called as well with these updates and was given a phone number to the practice to call them himself to get an update on referral. This old referral was taken in to affect back in Oct 2019 and stated no one called him from our office or the other facility but I told him that we could resend it again and he agreed to the outcome.

## 2018-12-25 NOTE — Telephone Encounter (Signed)
Refer to notes. 

## 2019-01-05 ENCOUNTER — Encounter: Payer: Self-pay | Admitting: Cardiovascular Disease

## 2019-01-05 ENCOUNTER — Ambulatory Visit (INDEPENDENT_AMBULATORY_CARE_PROVIDER_SITE_OTHER): Payer: Medicare Other | Admitting: Cardiovascular Disease

## 2019-01-05 VITALS — BP 140/80 | HR 53 | Ht 72.0 in | Wt 203.8 lb

## 2019-01-05 DIAGNOSIS — I6523 Occlusion and stenosis of bilateral carotid arteries: Secondary | ICD-10-CM

## 2019-01-05 DIAGNOSIS — I714 Abdominal aortic aneurysm, without rupture, unspecified: Secondary | ICD-10-CM

## 2019-01-05 DIAGNOSIS — I4821 Permanent atrial fibrillation: Secondary | ICD-10-CM | POA: Diagnosis not present

## 2019-01-05 DIAGNOSIS — E782 Mixed hyperlipidemia: Secondary | ICD-10-CM | POA: Diagnosis not present

## 2019-01-05 NOTE — Patient Instructions (Addendum)
Medication Instructions:  Your provider recommends that you continue on your current medications as directed. Please refer to the Current Medication list given to you today.    Labwork: Your provider recommends that you return for FASTING lab work.  Testing/Procedures: Your physician has requested that you have a carotid duplex the same day you have your abdominal ultrasound in February. This test is an ultrasound of the carotid arteries in your neck. It looks at blood flow through these arteries that supply the brain with blood. Allow one hour for this exam. There are no restrictions or special instructions.   Follow-Up: You will be called to arrange your 1 year visit with Dr. Excell Seltzer and echocardiogram for January 2021.

## 2019-01-05 NOTE — Progress Notes (Signed)
Cardiology Office Note:    Date:  01/05/2019   ID:  Leroy LaudJoseph T Vitrano, DOB [birth year], MRN [id number]  PCP:  Richmond CampbellKaplan, Kristen W., PA-C  Cardiologist:  Tonny BollmanMichael Azya Barbero, MD  Electrophysiologist:  None   Referring MD: Richmond CampbellKaplan, Kristen W., PA-C   Chief Complaint  Patient presents with  . Follow-up    atrial fibrillation    History of Present Illness:    Leroy Lara is a 73 y.o. male with a hx of atrial fibrillation, abdominal aortic aneurysm, and nonobstructive carotid stenosis.  The patient presents for follow-up evaluation.  He is here alone today.  He complains of generalized fatigue and joint aches.  He denies chest pain or pressure, dyspnea, edema, heart palpitations, orthopnea, or PND.  He does complain of dizziness, especially when he first gets out of bed in the morning.  He has not had frank syncope.  He does not have regular postural symptoms.  Past Medical History:  Diagnosis Date  . Essential hypertension   . Gastroesophageal reflux disease     History reviewed. No pertinent surgical history.  Current Medications: Current Meds  Medication Sig  . atorvastatin (LIPITOR) 10 MG tablet Take 1 tablet (10 mg total) by mouth daily.  Marland Kitchen. ELIQUIS 5 MG TABS tablet TAKE 1 TABLET BY MOUTH TWICE A DAY  . hydrochlorothiazide (HYDRODIURIL) 25 MG tablet Take 1 tablet (25 mg total) by mouth daily.  Marland Kitchen. losartan (COZAAR) 100 MG tablet Take 1 tablet (100 mg total) by mouth daily.  Marland Kitchen. omeprazole (PRILOSEC) 20 MG capsule Take 1 capsule by mouth daily.  . [DISCONTINUED] omeprazole (PRILOSEC) 40 MG capsule Take 0.5 capsules by mouth daily.      Allergies:   Sulfamethoxazole; Sulfasalazine; and Sulfonamide derivatives   Social History   Socioeconomic History  . Marital status: Divorced    Spouse name: Not on file  . Number of children: Not on file  . Years of education: Not on file  . Highest education level: Not on file  Occupational History  . Not on file  Social Needs  . Financial  resource strain: Not on file  . Food insecurity:    Worry: Not on file    Inability: Not on file  . Transportation needs:    Medical: Not on file    Non-medical: Not on file  Tobacco Use  . Smoking status: Former Games developermoker  . Smokeless tobacco: Never Used  Substance and Sexual Activity  . Alcohol use: Never    Frequency: Never  . Drug use: Never  . Sexual activity: Not on file  Lifestyle  . Physical activity:    Days per week: Not on file    Minutes per session: Not on file  . Stress: Not on file  Relationships  . Social connections:    Talks on phone: Not on file    Gets together: Not on file    Attends religious service: Not on file    Active member of club or organization: Not on file    Attends meetings of clubs or organizations: Not on file    Relationship status: Not on file  Other Topics Concern  . Not on file  Social History Narrative  . Not on file     Family History: The patient's family history includes Cancer - Lung in his father; Coronary artery disease (age of onset: 4170) in his mother; Heart failure in his sister; Lymphoma (age of onset: 837) in his brother; Pulmonary fibrosis in his brother.  ROS:  Please see the history of present illness.    Positive for fatigue, dizziness, joint pains.  All other systems reviewed and are negative.  EKGs/Labs/Other Studies Reviewed:    The following studies were reviewed today: Exercise tolerance test 06-19-2018: Study Highlights    Blood pressure demonstrated a hypertensive response to exercise.  Baseline EKG showed atrial fibrillation with LBBB.  Nondiagnostic EKG for ischemia due to underlying LBBB.  Normal chronotropic response to exercise with adequate heart rate response reaching 90% of MPHR.  Mild to moderately impaired exercise capacity at 5.6 mets.   Echo 11-27-2017: Study Conclusions  - Left ventricle: The cavity size was normal. Wall thickness was   increased in a pattern of moderate LVH. Systolic  function was   normal. The estimated ejection fraction was in the range of 55%   to 60%. Incoordinate septal motion. The study is not technically   sufficient to allow evaluation of LV diastolic function. - Mitral valve: Mildly thickened leaflets . There was trivial   regurgitation. - Left atrium: Moderately dilated. - Right atrium: Moderately dilated. - Tricuspid valve: There was trivial regurgitation. - Pulmonary arteries: PA peak pressure: 26 mm Hg (S). - Inferior vena cava: The vessel was normal in size. The   respirophasic diameter changes were in the normal range (= 50%),   consistent with normal central venous pressure.  Impressions:  - Compared to a prior study in 08/2016, the LVEF is higher at   55-60%. A-fib is noted and there is moderate biatrial enlargment.  Abdominal Duplex 01-30-2018: Final Interpretation:  Abdominal Aorta: The largest aortic measurement is 3.1 cm. The largest aortic diameter remains essentially unchanged compared to prior exam. Previous diameter measurement was 3.3 cm obtained on 02/22/20018.  Carotid Duplex 11-27-2017: Final Interpretation: Right Carotid: There is evidence in the right ICA of a 1-39% stenosis.  Left Carotid: There is evidence in the left ICA of a 1-39% stenosis.               Non-hemodynamically significant plaque noted in the CCA.  Vertebrals:  Both vertebral arteries were patent with antegrade flow. Subclavians: Normal flow hemodynamics were seen in bilateral subclavian              arteries.  Holter monitor 06/19/2018: Study Highlights     The patient was monitored for 48 hours.  The predominant rhythm was atrial fibrillation with an average ventricular rate of 56 bpm (range 34-100 bpm). The longest R-R interval was 2.0 seconds.  Atrial fibrillation burden is 99%.  Occasional PVC's were observed.  No sustained ventricular arrhythmia was observed.  Diary event correspond to atrial fibrillation with slow/normal  ventricular response.   Atrial fibrillation with occasional PVC's.  Relatively slow ventricular response.    EKG:  EKG is ordered today.  The ekg ordered today demonstrates atrial fibrillation, HR 53 bpm, LBBB. No change from prior tracings  Recent Labs: 12/12/2018: BUN 19; Creatinine, Ser 0.88; Hemoglobin 14.4; Platelets 247; Potassium 4.1; Sodium 139  Recent Lipid Panel    Component Value Date/Time   CHOL 190 11/27/2017 0823   TRIG 140 11/27/2017 0823   HDL 56 11/27/2017 0823   CHOLHDL 3.4 11/27/2017 0823   CHOLHDL 3.0 01/10/2016 0759   VLDL 19 01/10/2016 0759   LDLCALC 106 (H) 11/27/2017 0823   LDLDIRECT 154.8 08/26/2013 0739    Physical Exam:    VS:  BP 140/80   Pulse (!) 53   Ht 6' (1.829 m)   Wt 203 lb 12.8  oz (92.4 kg)   SpO2 92%   BMI 27.64 kg/m     Wt Readings from Last 3 Encounters:  01/05/19 203 lb 12.8 oz (92.4 kg)  09/29/18 201 lb (91.2 kg)  07/14/18 201 lb (91.2 kg)     GEN:  Well nourished, well developed in no acute distress HEENT: Normal NECK: No JVD; No carotid bruits LYMPHATICS: No lymphadenopathy CARDIAC: irregularly irregular, no murmurs, rubs, gallops RESPIRATORY:  Clear to auscultation without rales, wheezing or rhonchi  ABDOMEN: Soft, non-tender, non-distended MUSCULOSKELETAL:  No edema; No deformity  SKIN: Warm and dry NEUROLOGIC:  Alert and oriented x 3 PSYCHIATRIC:  Normal affect   ASSESSMENT:    1. AAA (abdominal aortic aneurysm) without rupture (HCC)   2. Permanent atrial fibrillation   3. Mixed hyperlipidemia   4. Bilateral carotid artery stenosis    PLAN:    In order of problems listed above:  1. Studies reviewed. Last scan showed AAA 3.1 cm (previous 3.3 cm). Stable dimensions - we discussed natural hx. Continue anticoagulation, statin Rx, BP controlled. 2. Slow atrial fibrillation, permanent. Studies reviewed from last year with Holter and GXT. Continue observation and anticoagulation. Repeat echo prior to next office visit.   3. Repeat lipids. Continue statin. LDL has been above goal. Need to be more aggressive with statin Rx, but he has been limited by myalgias. Continue switch to crestor. Await labs.  4. Check doppler. No symptoms. Last study reassuring with < 40% stenosis bilaterally. If stable will move to clinical follow-up only.   Medication Adjustments/Labs and Tests Ordered: Current medicines are reviewed at length with the patient today.  Concerns regarding medicines are outlined above.  Orders Placed This Encounter  Procedures  . Hepatic function panel  . Lipid panel  . EKG 12-Lead  . ECHOCARDIOGRAM COMPLETE   No orders of the defined types were placed in this encounter.   Patient Instructions  Medication Instructions:  Your provider recommends that you continue on your current medications as directed. Please refer to the Current Medication list given to you today.    Labwork: Your provider recommends that you return for FASTING lab work.  Testing/Procedures: Your physician has requested that you have a carotid duplex the same day you have your abdominal ultrasound in February. This test is an ultrasound of the carotid arteries in your neck. It looks at blood flow through these arteries that supply the brain with blood. Allow one hour for this exam. There are no restrictions or special instructions.   Follow-Up: You will be called to arrange your 1 year visit with Dr. Excell Seltzer and echocardiogram for January 2021.     Signed, [name], MD  01/05/2019 9:16 AM    [facility]

## 2019-01-09 ENCOUNTER — Other Ambulatory Visit: Payer: Medicare Other | Admitting: *Deleted

## 2019-01-09 DIAGNOSIS — E782 Mixed hyperlipidemia: Secondary | ICD-10-CM

## 2019-01-09 LAB — LIPID PANEL
Chol/HDL Ratio: 2.8 ratio (ref 0.0–5.0)
Cholesterol, Total: 154 mg/dL (ref 100–199)
HDL: 56 mg/dL (ref >39)
LDL Calculated: 85 mg/dL (ref 0–99)
Triglycerides: 65 mg/dL (ref 0–149)
VLDL Cholesterol Cal: 13 mg/dL (ref 5–40)

## 2019-01-09 LAB — HEPATIC FUNCTION PANEL
ALBUMIN: 3.8 g/dL (ref 3.7–4.7)
ALK PHOS: 59 IU/L (ref 39–117)
ALT: 20 IU/L (ref 0–44)
AST: 22 IU/L (ref 0–40)
BILIRUBIN TOTAL: 0.5 mg/dL (ref 0.0–1.2)
BILIRUBIN, DIRECT: 0.15 mg/dL (ref 0.00–0.40)
Total Protein: 6.5 g/dL (ref 6.0–8.5)

## 2019-01-15 ENCOUNTER — Telehealth: Payer: Self-pay

## 2019-01-15 DIAGNOSIS — E785 Hyperlipidemia, unspecified: Secondary | ICD-10-CM

## 2019-01-15 MED ORDER — ROSUVASTATIN CALCIUM 20 MG PO TABS: 20.0000 mg | ORAL_TABLET | Freq: Every day | ORAL | 3 refills | Status: DC

## 2019-01-15 NOTE — Telephone Encounter (Signed)
-----   Message from [name], MD sent at 01/14/2019  2:18 PM EST ----- Lipids above goal.  He has had some issues with muscle and joint aches.  Would recommend a trial of rosuvastatin 20 mg daily with repeat labs in 3 months.  Thanks

## 2019-01-15 NOTE — Telephone Encounter (Signed)
Reviewed results with patient who verbalized understanding.   Instructed patient to stop atorvastatin and START CRESTOR 20 mg daily. FLP and LFTs scheduled Apr 10, 2019. The patient was grateful for call and agrees with treatment plan.

## 2019-02-02 ENCOUNTER — Other Ambulatory Visit (HOSPITAL_COMMUNITY): Payer: Medicare Other

## 2019-02-04 ENCOUNTER — Other Ambulatory Visit (HOSPITAL_COMMUNITY): Payer: Medicare Other

## 2019-02-05 ENCOUNTER — Ambulatory Visit (HOSPITAL_BASED_OUTPATIENT_CLINIC_OR_DEPARTMENT_OTHER)
Admission: RE | Admit: 2019-02-05 | Discharge: 2019-02-05 | Disposition: A | Discharge status: Home / Self Care | Payer: Medicare Other | Source: Ambulatory Visit | Attending: Cardiovascular Disease | Admitting: Cardiovascular Disease

## 2019-02-05 ENCOUNTER — Ambulatory Visit (HOSPITAL_COMMUNITY)
Admission: RE | Admit: 2019-02-05 | Discharge: 2019-02-05 | Disposition: A | Discharge status: Home / Self Care | Payer: Medicare Other | Source: Ambulatory Visit | Attending: Cardiology | Admitting: Cardiology

## 2019-02-05 DIAGNOSIS — I6523 Occlusion and stenosis of bilateral carotid arteries: Secondary | ICD-10-CM

## 2019-02-05 DIAGNOSIS — I714 Abdominal aortic aneurysm, without rupture, unspecified: Secondary | ICD-10-CM

## 2019-02-06 ENCOUNTER — Telehealth: Payer: Self-pay

## 2019-02-06 DIAGNOSIS — I714 Abdominal aortic aneurysm, without rupture, unspecified: Secondary | ICD-10-CM

## 2019-02-06 NOTE — Telephone Encounter (Signed)
-----   Message from [name], MD sent at 02/05/2019 10:28 PM EST ----- Small abdominal aortic aneurysm noted with maximal diameter 3.2 cm.  Stable from previous study.  Repeat in 1 year as recommended.

## 2019-02-06 NOTE — Telephone Encounter (Signed)
Reviewed results with patient who verbalized understanding.   Repeat study ordered to be scheduled in 1 year. Leroy Lara agrees with treatment plan.

## 2019-02-16 ENCOUNTER — Ambulatory Visit (INDEPENDENT_AMBULATORY_CARE_PROVIDER_SITE_OTHER): Payer: Self-pay

## 2019-02-16 ENCOUNTER — Encounter (INDEPENDENT_AMBULATORY_CARE_PROVIDER_SITE_OTHER): Payer: Self-pay | Admitting: Orthopedic Surgery

## 2019-02-16 ENCOUNTER — Ambulatory Visit (INDEPENDENT_AMBULATORY_CARE_PROVIDER_SITE_OTHER): Payer: Medicare Other | Admitting: Orthopedic Surgery

## 2019-02-16 VITALS — Ht 72.0 in | Wt 203.0 lb

## 2019-02-16 DIAGNOSIS — M5441 Lumbago with sciatica, right side: Secondary | ICD-10-CM | POA: Diagnosis not present

## 2019-02-16 DIAGNOSIS — M25562 Pain in left knee: Secondary | ICD-10-CM | POA: Diagnosis not present

## 2019-02-16 DIAGNOSIS — M542 Cervicalgia: Secondary | ICD-10-CM

## 2019-02-16 DIAGNOSIS — G8929 Other chronic pain: Secondary | ICD-10-CM

## 2019-02-16 DIAGNOSIS — M25551 Pain in right hip: Secondary | ICD-10-CM | POA: Diagnosis not present

## 2019-02-16 NOTE — Progress Notes (Signed)
Office Visit Note   Patient: Leroy Lara           Date of Birth: 1945/12/17           MRN: [id number] Visit Date: 02/16/2019              Requested by: Richmond Campbell., PA-C [address] [place] [address] PCP: Richmond Campbell., PA-C  Chief Complaint  Patient presents with  . Left Knee - Pain  . Neck - Pain  . Lower Back - Pain  . Right Hip - Pain      HPI: Patient is a 73 year old gentleman who presents complaining of paraspinous muscle pain in his neck with stiffness in the morning.  Patient also complains of midline lower back pain with stiffness worse in the morning.  States occasionally has had some radicular symptoms down the right lower extremity never to his foot.  Patient states that yoga has made his symptoms better.  Patient also complains of pain of the medial patellofemoral joint left knee.  Patient also complains of occasional bilateral hip groin pain.  Assessment & Plan: Visit Diagnoses:  1. Chronic pain of left knee   2. Neck pain   3. Chronic right-sided low back pain with right-sided sciatica   4. Pain in right hip     Plan: Recommended continuing with his yoga recommended heat for the neck and back and strengthening for the knees.  Discussed that we could use a low-dose prednisone that should help all 3 areas he is on Eliquis and recommended against using nonsteroidals.  Patient states he would like to try the heat and exercise first and if this does not work he will call and we will set him up for low-dose prednisone treatment  Follow-Up Instructions: Return if symptoms worsen or fail to improve.   Ortho Exam  Patient is alert, oriented, no adenopathy, well-dressed, normal affect, normal respiratory effort. Examination patient has decreased range of motion of the cervical spine thoracic outlet is nontender to palpation bilaterally is no focal motor weakness in either upper extremity.  Patient has no pain with range of motion of the hips  knees or ankle he has a negative straight leg raise bilaterally with no focal motor weakness.  The left knee is tender to palpation the medial facet of the patellofemoral joint there is crepitation with range of motion the medial lateral joint lines are nontender collaterals and cruciates are stable.  Imaging: Xr Hip Unilat W Or W/o Pelvis 1v Right  Result Date: 02/16/2019 AP pelvis shows a congruent joint space of both hips.  Xr Knee 1-2 Views Left  Result Date: 02/16/2019 2 view radiographs of the left knee shows mild medial joint space narrowing with no significant bony spurs.  Xr Cervical Spine 2 Or 3 Views  Result Date: 02/16/2019 2 view radiographs of the cervical spine shows joint space narrowing with mild osteophytic bone spurs anteriorly.  Xr Lumbar Spine 2-3 Views  Result Date: 02/16/2019 2 view radiographs of the lumbar spine shows a calcified aorta 3 cm in diameter there is some joint space narrowing with mild  anterior osteophytic bone spurs and a facet collapse on the right at L4-5  No images are attached to the encounter.  Labs: No results found for: HGBA1C, ESRSEDRATE, CRP, LABURIC, REPTSTATUS, GRAMSTAIN, CULT, LABORGA   Lab Results  Component Value Date   ALBUMIN 3.8 01/09/2019   ALBUMIN 3.9 11/27/2017   ALBUMIN 3.9 11/16/2016  Body mass index is 27.53 kg/m.  Orders:  Orders Placed This Encounter  Procedures  . XR Knee 1-2 Views Left  . XR Cervical Spine 2 or 3 views  . XR Lumbar Spine 2-3 Views  . XR HIP UNILAT W OR W/O PELVIS 1V RIGHT   No orders of the defined types were placed in this encounter.    Procedures: No procedures performed  Clinical Data: No additional findings.  ROS:  All other systems negative, except as noted in the HPI. Review of Systems  Objective: Vital Signs: Ht 6' (1.829 m)   Wt 203 lb (92.1 kg)   BMI 27.53 kg/m   Specialty Comments:  No specialty comments available.  PMFS History: Patient Active Problem List    Diagnosis Date Noted  . Carpal tunnel syndrome of left wrist 06/16/2018  . Right hip pain 01/29/2017  . Left hip pain 01/29/2017  . Abnormal EKG 01/28/2012  . Hypertension, benign 01/28/2012  . Right carotid bruit 01/28/2012   Past Medical History:  Diagnosis Date  . Essential hypertension   . Gastroesophageal reflux disease     Family History  Problem Relation Age of Onset  . Coronary artery disease Mother 30       myocardial infarction  . Cancer - Lung Father   . Heart failure Sister   . Pulmonary fibrosis Brother   . Lymphoma Brother 37    History reviewed. No pertinent surgical history. Social History   Occupational History  . Not on file  Tobacco Use  . Smoking status: Former Games developer  . Smokeless tobacco: Never Used  Substance and Sexual Activity  . Alcohol use: Never    Frequency: Never  . Drug use: Never  . Sexual activity: Not on file

## 2019-04-10 ENCOUNTER — Other Ambulatory Visit: Payer: Medicare Other

## 2019-07-15 ENCOUNTER — Other Ambulatory Visit: Payer: Self-pay | Admitting: Cardiovascular Disease

## 2019-07-15 DIAGNOSIS — I4891 Unspecified atrial fibrillation: Secondary | ICD-10-CM

## 2019-07-15 NOTE — Telephone Encounter (Signed)
103m 92.1kg Scr 0.88 12/12/18 lov cooper 01/05/19

## 2019-08-31 ENCOUNTER — Ambulatory Visit (INDEPENDENT_AMBULATORY_CARE_PROVIDER_SITE_OTHER): Payer: Medicare Other | Admitting: Physician Assistant

## 2019-08-31 ENCOUNTER — Encounter: Payer: Self-pay | Admitting: Physician Assistant

## 2019-08-31 ENCOUNTER — Other Ambulatory Visit: Payer: Self-pay

## 2019-08-31 VITALS — Ht 72.0 in | Wt 193.4 lb

## 2019-08-31 DIAGNOSIS — I714 Abdominal aortic aneurysm, without rupture, unspecified: Secondary | ICD-10-CM

## 2019-08-31 DIAGNOSIS — R0789 Other chest pain: Secondary | ICD-10-CM | POA: Diagnosis not present

## 2019-08-31 DIAGNOSIS — I209 Angina pectoris, unspecified: Secondary | ICD-10-CM

## 2019-08-31 DIAGNOSIS — R0602 Shortness of breath: Secondary | ICD-10-CM | POA: Diagnosis not present

## 2019-08-31 DIAGNOSIS — I6523 Occlusion and stenosis of bilateral carotid arteries: Secondary | ICD-10-CM

## 2019-08-31 DIAGNOSIS — E782 Mixed hyperlipidemia: Secondary | ICD-10-CM | POA: Diagnosis not present

## 2019-08-31 DIAGNOSIS — I208 Other forms of angina pectoris: Secondary | ICD-10-CM

## 2019-08-31 DIAGNOSIS — I4819 Other persistent atrial fibrillation: Secondary | ICD-10-CM

## 2019-08-31 DIAGNOSIS — R079 Chest pain, unspecified: Secondary | ICD-10-CM

## 2019-08-31 DIAGNOSIS — Z79899 Other long term (current) drug therapy: Secondary | ICD-10-CM

## 2019-08-31 NOTE — Progress Notes (Signed)
Cardiology Office Note    Date:  08/31/2019   ID:  Leroy Lara, DOB 03-Jun-1946, MRN [id number]  PCP:  Richmond Campbell., PA-C  Cardiologist:  Dr. Excell Seltzer  Chief Complaint: DOE  History of Present Illness:   Leroy Lara is a 73 y.o. male  with a hx of permanent atrial fibrillation with slow rate, HTN, abdominal aortic aneurysm, and bilateral mild carotid stenosis added for DOE.   Long standing hx of dizziness while getting out of bed, especially in morning. No syncope. Last seen by Dr. Excell Seltzer 12/2018. Started on Crestor 20mg  qd.   Carotid doppler 01/2019: Less than 40% bilateral carotid stenosis.  Abdominal ultrasound 01/2019 showed AAA of of 3.2cm (stable). Holter 06/2018: atrial fibrillation with ave hx of 56 bpm GXT 06/2018 showed hypertensive response to exercise. Non diagnostic EKG 2nd to LBBB.  Echo 11/2017 showed LVEF of 55-60%. Moderate biatrial enlargement.  Low risks tress test 01/2012.   Here today for DOE.  Patient is very active at his baseline.  He had his own telecommunications business.  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  For the past 3 to 4 months he has noted exertional dyspnea without chest pain.  He also noted severe shortness of breath and may be chest discomfort during sex.  Progressively worsening.  He used to walk 3 story building without any issue but unable to do so lately.  He denies palpitation, orthopnea, PND, syncope, lower extremity edema or melena.  He has chronic stable dizziness.  Uses low-sodium diet. Loosing weight unintentionally.    Past Medical History:  Diagnosis Date  . AAA (abdominal aortic aneurysm) (HCC)   . Atrial fibrillation, permanent   . Carotid artery calcification, bilateral   . Essential hypertension   . Gastroesophageal reflux disease     History reviewed. No pertinent surgical history.  Current Medications: Prior to Admission medications   Medication Sig Start Date End Date Taking? Authorizing Provider  ELIQUIS 5 MG  TABS tablet TAKE 1 TABLET BY MOUTH TWICE A DAY 07/15/19   09/14/19, MD  hydrochlorothiazide (HYDRODIURIL) 25 MG tablet Take 1 tablet (25 mg total) by mouth daily. 10/22/18 10/17/19  13/7/20, MD  losartan (COZAAR) 100 MG tablet Take 1 tablet (100 mg total) by mouth daily. 10/22/18 10/17/19  13/7/20, MD  omeprazole (PRILOSEC) 20 MG capsule Take 1 capsule by mouth daily. 12/01/18   [provider]  rosuvastatin (CRESTOR) 20 MG tablet Take 1 tablet (20 mg total) by mouth daily. 01/15/19 01/10/20  01/12/20, MD    Allergies:   Sulfamethoxazole, Sulfasalazine, and Sulfonamide derivatives   Social History   Socioeconomic History  . Marital status: Divorced    Spouse name: Not on file  . Number of children: Not on file  . Years of education: Not on file  . Highest education level: Not on file  Occupational History  . Not on file  Social Needs  . Financial resource strain: Not on file  . Food insecurity    Worry: Not on file    Inability: Not on file  . Transportation needs    Medical: Not on file    Non-medical: Not on file  Tobacco Use  . Smoking status: Former [name]  . Smokeless tobacco: Never Used  Substance and Sexual Activity  . Alcohol use: Never    Frequency: Never  . Drug use: Never  . Sexual activity: Not on file  Lifestyle  . Physical activity  Days per week: Not on file    Minutes per session: Not on file  . Stress: Not on file  Relationships  . Social Musician on phone: Not on file    Gets together: Not on file    Attends religious service: Not on file    Active member of club or organization: Not on file    Attends meetings of clubs or organizations: Not on file    Relationship status: Not on file  Other Topics Concern  . Not on file  Social History Narrative  . Not on file     Family History:  The patient's family history includes Cancer - Lung in his father; Coronary artery disease (age of onset: 33) in his  mother; Heart failure in his sister; Lymphoma (age of onset: 33) in his brother; Pulmonary fibrosis in his brother.   ROS:   Please see the history of present illness.    ROS All other systems reviewed and are negative.   PHYSICAL EXAM:   VS:  Ht 6' (1.829 m)   Wt 193 lb 6.4 oz (87.7 kg)   SpO2 95%   BMI 26.23 kg/m    GEN: Well nourished, well developed, in no acute distress  HEENT: normal  Neck: no JVD, carotid bruits, or masses Cardiac: Continues to run RRR; no murmurs, rubs, or gallops,no edema  Respiratory:  clear to auscultation bilaterally, normal work of breathing GI: soft, nontender, nondistended, + BS MS: no deformity or atrophy  Skin: warm and dry, no rash Neuro:  Alert and Oriented x 3, Strength and sensation are intact Psych: euthymic mood, full affect  Wt Readings from Last 3 Encounters:  08/31/19 193 lb 6.4 oz (87.7 kg)  02/16/19 203 lb (92.1 kg)  01/05/19 203 lb 12.8 oz (92.4 kg)      Studies/Labs Reviewed:   EKG:  EKG is ordered today.  The ekg ordered today demonstrates atrial fibrillation at rate of 66 bpm  Recent Labs: 12/12/2018: BUN 19; Creatinine, Ser 0.88; Hemoglobin 14.4; Platelets 247; Potassium 4.1; Sodium 139 01/09/2019: ALT 20   Lipid Panel    Component Value Date/Time   CHOL 154 01/09/2019 0718   TRIG 65 01/09/2019 0718   HDL 56 01/09/2019 0718   CHOLHDL 2.8 01/09/2019 0718   CHOLHDL 3.0 01/10/2016 0759   VLDL 19 01/10/2016 0759   LDLCALC 85 01/09/2019 0718   LDLDIRECT 154.8 08/26/2013 0739    Additional studies/ records that were reviewed today include:  As summarized above   ASSESSMENT & PLAN:   1.  Dyspnea on exertion Progressively worsening in past 3 to 4 months.   He used to do activity which he cannot do now due to shortness of breath.  May have associated chest pain during sex.  No radiation. Resolves with rest. Highly suspicious of anginal equivalent.  Discussed invasive versus noninvasive evaluation.  After long discussion  we decided to proceed with coronary CT.  No aspirin as he is on Eliquis.  Continue Crestor.  He will not need metoprolol for coronary CT as heart rate in 60s. Will get BNP to assess volume status, likely normal. May consider echo at follow up if non conclusive CT.   2. Permanent atrial fibrillation with slow rate -Not on any rate control agent.  Continue Eliquis.  3.  HTN -Elevated.  He states his blood pressure always runs in 140-150s.  No change in his therapy.  4. AAA - Stable at 3.2cm on recent study  01/2019  5.  Mild carotid artery stenosis - less than 40% bilaterally on doppler 01/2019 -Continue statin  6.  Hyperlipidemia -Continue Crestor 20 mg.  He is due for his lab.  7.  Dizziness -He has a chronic dizziness which is stable.  He is not orthostatic per vital however systolic blood pressure dropped as below.  No change in therapy currently. Orthostatic VS for the past 24 hrs:  BP- Lying Pulse- Lying BP- Sitting Pulse- Sitting BP- Standing at 0 minutes Pulse- Standing at 0 minutes  08/31/19 1531 152/78 95 154/82 95 138/82 95  3 minutes standing BP 148/82   8. Weight loss - recently lost 10-15 lb. Get CBC with other labs.  - He will follow up with his PCP   Medication Adjustments/Labs and Tests Ordered: Current medicines are reviewed at length with the patient today.  Concerns regarding medicines are outlined above.  Medication changes, Labs and Tests ordered today are listed in the Patient Instructions below. Patient Instructions  Medication Instructions:   Your physician recommends that you continue on your current medications as directed. Please refer to the Current Medication list given to you today.  If you need a refill on your cardiac medications before your next appointment, please call your pharmacy.   Lab work:  You will have labs drawn today: Lipid Panel, CMP, CBC, BNP  If you have labs (blood work) drawn today and your tests are completely normal, you will  receive your results only by: Marland Kitchen. MyChart Message (if you have MyChart) OR . A paper copy in the mail If you have any lab test that is abnormal or we need to change your treatment, we will call you to review the results.  Testing/Procedures:  [facility].  Follow-Up: At [facility], you and your health needs are our priority.  As part of our continuing mission to provide you with exceptional heart care, we have created designated Provider Care Teams.  These Care Teams include your primary Cardiologist (physician) and Advanced Practice Providers (APPs -  Physician Assistants and Nurse Practitioners) who all work together to provide you with the care you need, when you need it. You will need a follow up appointment after your coronary CT. You may see one of the following Advanced Practice Providers on your designated Care Team: Tereso NewcomerScott Weaver, PA-C [place], [place] . Berton BonJanine Hammond, NP  Any Other Special Instructions Will Be Listed Below (If Applicable).  Your cardiac CT will be scheduled at one of the below locations:   [facility] [address] [place], [place] [address] [phone]  If scheduled at [facility], please arrive at the [facility] main entrance of [facility] 30-45 minutes prior to test start time. Proceed to the [facility] Radiology Department (first floor) to check-in and test prep. prep.  Please follow these instructions carefully (unless otherwise directed):  Hold all erectile dysfunction medications at least 3 days (72 hrs) prior to test.  On the Night Before the Test: . Be sure to Drink plenty of water. . Do not consume any caffeinated/decaffeinated beverages or chocolate 12 hours prior to your test. . Do not take any antihistamines 12 hours prior to your test.   On the Day of the Test: . Drink plenty of water. Do not drink any water within one hour of the test. . Do not eat any food 4 hours prior to  the test. . You may take your  regular medications prior to the test.  . Take metoprolol (Lopressor) two hours prior to test. . HOLD Furosemide/Hydrochlorothiazide morning of the test.   *For Clinical Staff only. Please instruct patient the following:*        -Drink plenty of water       -Hold hydrochlorothiazide morning of the test                    After the Test: . Drink plenty of water. . After receiving IV contrast, you may experience a mild flushed feeling. This is normal. . On occasion, you may experience a mild rash up to 24 hours after the test. This is not dangerous. If this occurs, you can take Benadryl 25 mg and increase your fluid intake. . If you experience trouble breathing, this can be serious. If it is severe call 911 IMMEDIATELY. If it is mild, please call our office.    Please contact the cardiac imaging nurse navigator should you have any questions/concerns Marchia Bond, RN Navigator Cardiac [place] [facility] [phone] Office  [phone] Cell       Signed, Leanor Kail, [place]  08/31/2019 4:25 PM    [facility] [place], [place], [place]  [address] Phone: [phone]; Fax: [phone]

## 2019-08-31 NOTE — Patient Instructions (Addendum)
Medication Instructions:   Your physician recommends that you continue on your current medications as directed. Please refer to the Current Medication list given to you today.  If you need a refill on your cardiac medications before your next appointment, please call your pharmacy.   Lab work:  You will have labs drawn tomorrow: Lipid Panel, CMP, CBC, BNP  If you have labs (blood work) drawn today and your tests are completely normal, you will receive your results only by: Marland Kitchen MyChart Message (if you have MyChart) OR . A paper copy in the mail If you have any lab test that is abnormal or we need to change your treatment, we will call you to review the results.  Testing/Procedures:  Cor[facility]ary CT [facility].  Follow-Up: At [facility], you and your health needs are our priority.  As part of our continuing mission to provide you with exceptional heart care, we have created designated Provider Care Teams.  These Care Teams include your primary Cardiologist (physician) and Advanced Practice Providers (APPs -  Physician Assistants and Nurse Practitioners) who all work together to provide you with the care you need, when you need it. You will need a follow up appointment after your coronary CT. You may see one of the following Advanced Practice Providers on your designated Care Team: Tereso Newcomer, PA-C [place], [place] . Berton Bon, NP  Any Other Special Instructions Will Be Listed Below (If Applicable).  Your cardiac CT will be scheduled at one of the below locations:   [facility] [address] [place], [place] [address] [phone]  When scheduled at [facility], please arrive at the [facility] main entrance of [facility] 30-45 minutes prior to test start time. Proceed to the [facility] Radiology Department (first floor) to check-in and test prep. prep.  Please follow these instructions carefully (unless otherwise  directed):  Hold all erectile dysfunction medications at least 3 days (72 hrs) prior to test.  On the Night Before the Test: . Be sure to Drink plenty of water. . Do not consume any caffeinated/decaffeinated beverages or chocolate 12 hours prior to your test. . Do not take any antihistamines 12 hours prior to your test.   On the Day of the Test: . Drink plenty of water. Do not drink any water within one hour of the test. . Do not eat any food 4 hours prior to the test. . You may take your regular medications prior to the test.  . HOLD Hydrochlorothiazide morning of the test.   *For Clinical Staff only. Please instruct patient the following:*        -Drink plenty of water       -Hold hydrochlorothiazide morning of the test                    After the Test: . Drink plenty of water. . After receiving IV contrast, you may experience a mild flushed feeling. This is normal. . On occasion, you may experience a mild rash up to 24 hours after the test. This is not dangerous. If this occurs, you can take Benadryl 25 mg and increase your fluid intake. . If you experience trouble breathing, this can be serious. If it is severe call 911 IMMEDIATELY. If it is mild, please call our office.    Please contact the cardiac imaging nurse navigator should you have any questions/concerns Rockwell Alexandria, RN Navigator Cardiac Imaging [place] [facility] [phone] Office  [phone] Cell

## 2019-09-01 ENCOUNTER — Telehealth: Payer: Self-pay | Admitting: *Deleted

## 2019-09-01 ENCOUNTER — Other Ambulatory Visit: Payer: Medicare Other

## 2019-09-01 DIAGNOSIS — Z79899 Other long term (current) drug therapy: Secondary | ICD-10-CM

## 2019-09-01 DIAGNOSIS — E782 Mixed hyperlipidemia: Secondary | ICD-10-CM

## 2019-09-01 DIAGNOSIS — R0602 Shortness of breath: Secondary | ICD-10-CM

## 2019-09-01 LAB — CBC
Hematocrit: 44.1 % (ref 37.5–51.0)
Hemoglobin: 14.7 g/dL (ref 13.0–17.7)
MCH: 32.5 pg (ref 26.6–33.0)
MCHC: 33.3 g/dL (ref 31.5–35.7)
MCV: 98 fL — ABNORMAL HIGH (ref 79–97)
Platelets: 281 10*3/uL (ref 150–450)
RBC: 4.52 x10E6/uL (ref 4.14–5.80)
RDW: 11.8 % (ref 11.6–15.4)
WBC: 9.2 10*3/uL (ref 3.4–10.8)

## 2019-09-01 LAB — LIPID PANEL
Chol/HDL Ratio: 2.5 ratio (ref 0.0–5.0)
Cholesterol, Total: 131 mg/dL (ref 100–199)
HDL: 52 mg/dL (ref >39)
LDL Chol Calc (NIH): 62 mg/dL (ref 0–99)
Triglycerides: 89 mg/dL (ref 0–149)
VLDL Cholesterol Cal: 17 mg/dL (ref 5–40)

## 2019-09-01 LAB — COMPREHENSIVE METABOLIC PANEL
ALT: 18 IU/L (ref 0–44)
AST: 20 IU/L (ref 0–40)
Albumin/Globulin Ratio: 1.1 — ABNORMAL LOW (ref 1.2–2.2)
Albumin: 3.8 g/dL (ref 3.7–4.7)
Alkaline Phosphatase: 68 IU/L (ref 39–117)
BUN/Creatinine Ratio: 17 (ref 10–24)
BUN: 15 mg/dL (ref 8–27)
Bilirubin Total: 0.6 mg/dL (ref 0.0–1.2)
CO2: 25 mmol/L (ref 20–29)
Calcium: 9.1 mg/dL (ref 8.6–10.2)
Chloride: 100 mmol/L (ref 96–106)
Creatinine, Ser: 0.87 mg/dL (ref 0.76–1.27)
GFR calc Af Amer: 99 mL/min/{1.73_m2} (ref >59)
GFR calc non Af Amer: 86 mL/min/{1.73_m2} (ref >59)
Globulin, Total: 3.5 g/dL (ref 1.5–4.5)
Glucose: 105 mg/dL — ABNORMAL HIGH (ref 65–99)
Potassium: 4.4 mmol/L (ref 3.5–5.2)
Sodium: 137 mmol/L (ref 134–144)
Total Protein: 7.3 g/dL (ref 6.0–8.5)

## 2019-09-01 LAB — PRO B NATRIURETIC PEPTIDE: NT-Pro BNP: 967 pg/mL — ABNORMAL HIGH (ref 0–376)

## 2019-09-01 MED ORDER — METOPROLOL TARTRATE 100 MG PO TABS: ORAL_TABLET | ORAL | 0 refills | Status: DC

## 2019-09-01 NOTE — Telephone Encounter (Signed)
Call placed to pt re: Cardiac Ct.  Pt has been made aware that he needs to take a Lopressor 100 mg 2 hours prior to his CT and that it has been sent to [facility]. Pt verbalized understanding.

## 2019-09-01 NOTE — Progress Notes (Signed)
Thanks. Vin let's move forward with it.

## 2019-09-02 ENCOUNTER — Telehealth: Payer: Self-pay | Admitting: *Deleted

## 2019-09-02 NOTE — Telephone Encounter (Signed)
Call placed to pt re: lab results, left a message for pt to call back.  

## 2019-09-02 NOTE — Telephone Encounter (Signed)
Patient returning call for results 

## 2019-09-02 NOTE — Telephone Encounter (Signed)
-----   Message from [place], [place] sent at 09/02/2019 10:28 AM EDT ----- Please give only  Lopressor 50 mg 2 hours prior to his CT. HR already in 60s. Hgb, Scr and electrolytes are stable. His fluid marker is up. Give lasix 20mg  daily for 5 days starting tomorrow (hold HCTZ while on lasix) and call us Monday let us know if dyspnea improved or not. Will give further direction at that time.

## 2019-09-02 NOTE — Telephone Encounter (Signed)
Returned pts call.  Left another message for pt to call back. 

## 2019-09-03 MED ORDER — FUROSEMIDE 20 MG PO TABS: ORAL_TABLET | ORAL | 0 refills | Status: DC

## 2019-09-03 NOTE — Telephone Encounter (Signed)
Pt ha been made aware of his lab results and recommendations. See result note.

## 2019-09-07 ENCOUNTER — Telehealth: Payer: Self-pay | Admitting: Physician Assistant

## 2019-09-07 NOTE — Telephone Encounter (Signed)
Pt called to report that he don't feel noticeably better as far as the sob on the Lasix. Pt advised to continue with the Lasix until he heard back from our office. Will route to [employer], PA-C for further recommendations.

## 2019-09-07 NOTE — Telephone Encounter (Signed)
Patient called stating that he was told to call office today to give update on new medication he is on.

## 2019-09-08 NOTE — Telephone Encounter (Signed)
Pt as been made aware to stop the Lasix.  Pt advised that today was day 5 of it and would be his last dose.  He is awaiting the call to have his CT Scheduled. Pt will call back on Friday to let us know how he is feeling.

## 2019-09-14 ENCOUNTER — Other Ambulatory Visit: Payer: Self-pay | Admitting: Cardiovascular Disease

## 2019-09-15 MED ORDER — LOSARTAN POTASSIUM 100 MG PO TABS: 100.0000 mg | ORAL_TABLET | Freq: Every day | ORAL | 3 refills | Status: DC

## 2019-09-19 ENCOUNTER — Other Ambulatory Visit: Payer: Self-pay | Admitting: Cardiovascular Disease

## 2019-09-22 ENCOUNTER — Telehealth (HOSPITAL_COMMUNITY): Payer: Self-pay | Admitting: Emergency Medicine

## 2019-09-22 NOTE — Telephone Encounter (Signed)
Pt returning phone call regarding upcoming cardiac imaging study; pt verbalizes understanding of appt date/time, parking situation and where to check in, pre-test NPO status and medications ordered, and verified current allergies; name and call back number provided for further questions should they arise Nyriah Coote RN Navigator Cardiac Imaging [facility] [phone] office [phone] cell   

## 2019-09-22 NOTE — Telephone Encounter (Signed)
Left message on voicemail with name and callback number Aevah Stansbery RN Navigator Cardiac Imaging [place] [facility] [phone] Office [phone] Cell  

## 2019-09-23 ENCOUNTER — Other Ambulatory Visit: Payer: Self-pay

## 2019-09-23 ENCOUNTER — Ambulatory Visit (HOSPITAL_COMMUNITY)
Admission: RE | Admit: 2019-09-23 | Discharge: 2019-09-23 | Disposition: A | Discharge status: Home / Self Care | Payer: Medicare Other | Source: Ambulatory Visit | Attending: Physician Assistant | Admitting: Physician Assistant

## 2019-09-23 ENCOUNTER — Encounter (HOSPITAL_COMMUNITY): Payer: Self-pay

## 2019-09-23 DIAGNOSIS — I208 Other forms of angina pectoris: Secondary | ICD-10-CM

## 2019-09-23 IMAGING — CT CT HEART MORP W/ CTA COR W/ SCORE W/ CA W/CM &/OR W/O CM
4 of 7 series · 8 of 20 positions shown, 9 images · non-contrast
Comparison: None.
COMPARISON: None.

Addendum:
EXAM:
OVER-READ INTERPRETATION  CT CHEST

The following report is an over-read performed by radiologist Dr.
ZULUAGA [REDACTED] on [DATE]. This
over-read does not include interpretation of cardiac or coronary
anatomy or pathology. The coronary calcium score/coronary CTA
interpretation by the cardiologist is attached.
CLINICAL DATA: Chest pain
Cardiac CTA
MEDICATIONS:
Sub lingual nitro. 4mg x 2
TECHNIQUE: The patient was scanned on a Siemens [REDACTED]ice scanner. Gantry
rotation speed was 250 msecs. Collimation was 0.6 mm. A 100 kV
prospective scan was triggered in the ascending thoracic aorta at
35-75% of the R-R interval. Average HR during the scan was 60 bpm.
The 3D data set was interpreted on a dedicated work station using
MPR, MIP and VRT modes. A total of 80cc of contrast was used.

[Series 6: best diast 67 % · axial · 0.39mm/px · z∈[+1086,+1133]mm · 2 of 356 slices shown, 3 images]
[im 119/356  vessel]
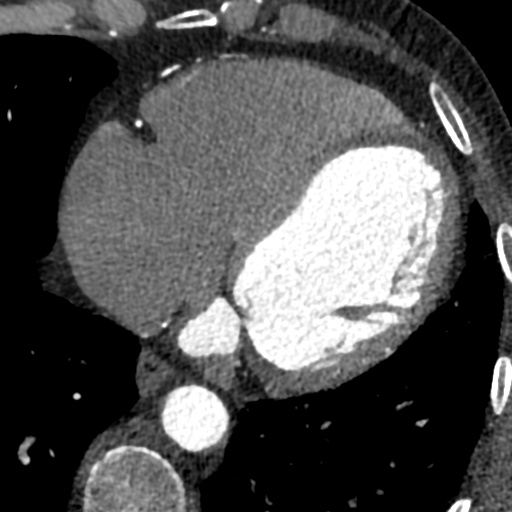
[im 119/356  lung]
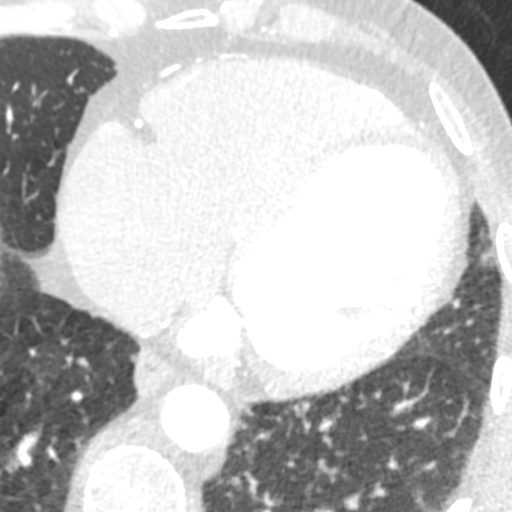
[im 237/356  vessel]
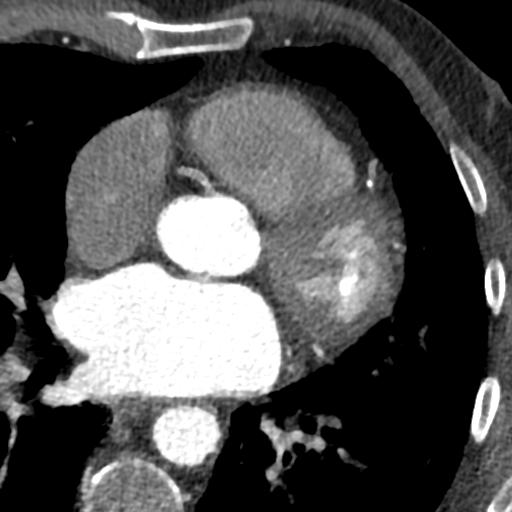

[Series 7: best syst 67 % · axial · 0.39mm/px · z∈[+1086,+1133]mm · 2 of 356 slices shown]
[im 119/356  vessel]
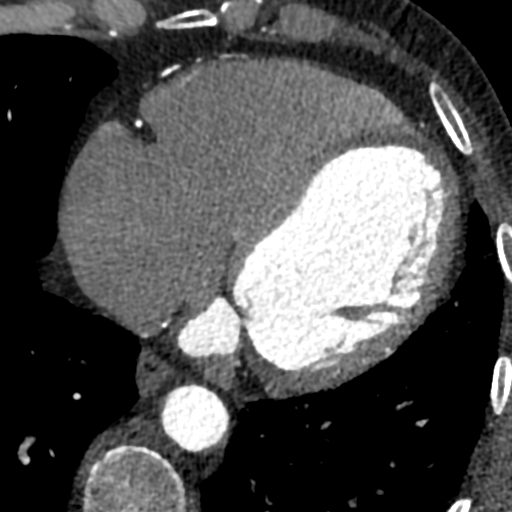
[im 237/356  vessel]
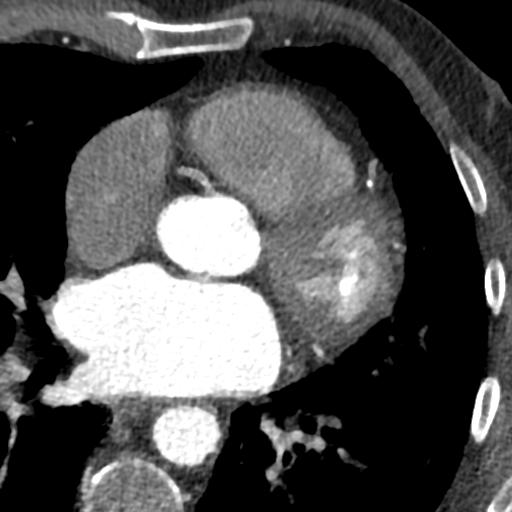

[Series 8: ts diast sharp 67 % · axial · 0.39mm/px · z∈[+1086,+1133]mm · 2 of 356 slices shown]
[im 119/356  lung]
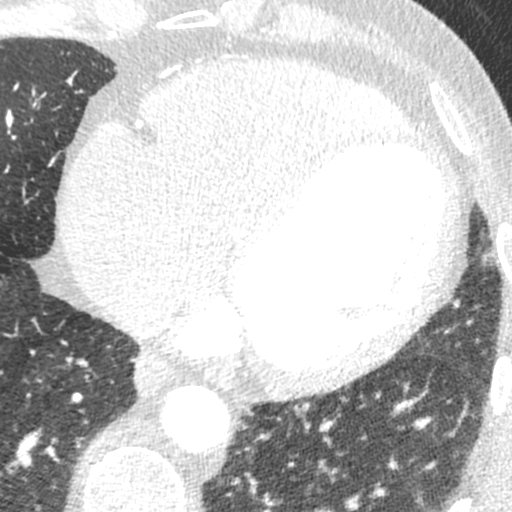
[im 237/356  lung]
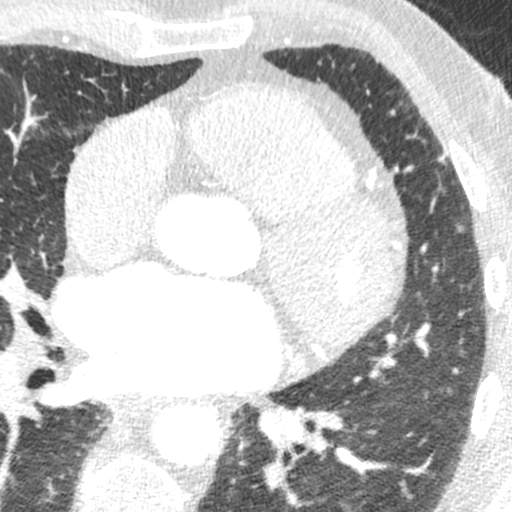

[Series 9: ts syst sharp 67 % · axial · 0.39mm/px · z∈[+1086,+1133]mm · 2 of 356 slices shown]
[im 119/356  lung]
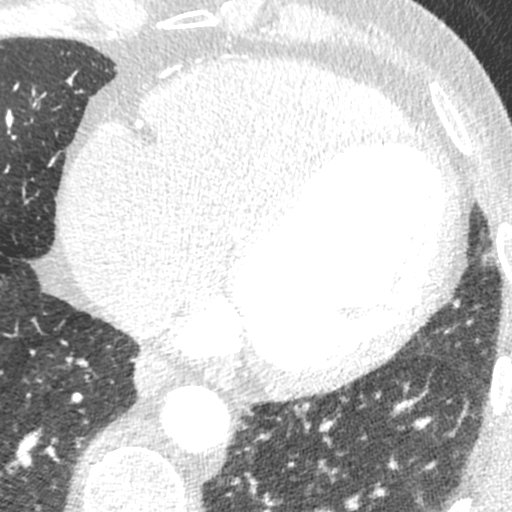
[im 237/356  lung]
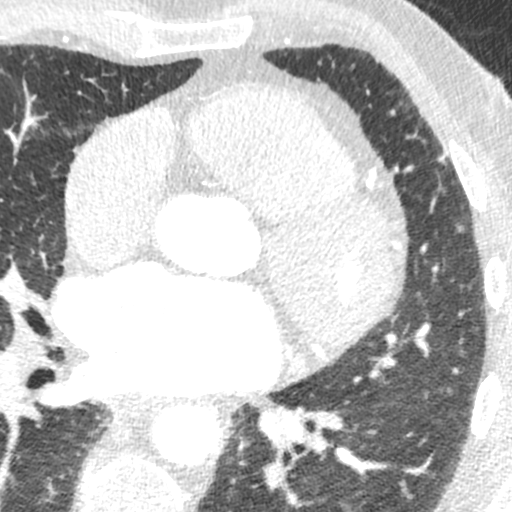

[8 of 20 positions shown; findings below may reference images not displayed]

FINDINGS: Aortic atherosclerosis. Several small pulmonary nodules are noted
throughout the visualize lungs, largest of which is in the posterior
aspect of the left lower lobe (axial image 22 of series 12)
measuring 1.0 x 0.6 cm (mean diameter of 0.8 cm). Within the
visualized portions of the thorax there are no other larger more
suspicious appearing pulmonary nodules or masses, there is no acute
consolidative airspace disease, no pleural effusions, no
pneumothorax and no lymphadenopathy. Visualized portions of the
upper abdomen are unremarkable. There are no aggressive appearing
lytic or blastic lesions noted in the visualized portions of the
skeleton.
IMPRESSION: 1. Multiple small pulmonary nodules in the visualize lungs measuring
8 mm or less in size. Non-contrast chest CT at 3-6 months is
recommended. If the nodules are stable at time of repeat CT, then
future CT at 18-24 months (from today's scan) is considered optional
for low-risk patients, but is recommended for high-risk patients.
This recommendation follows the consensus statement: Guidelines for
Management of Incidental Pulmonary Nodules Detected on CT Images:
2.  Aortic Atherosclerosis ([8P]-[8P]).
FINDINGS: Non-cardiac: See separate report from [REDACTED].

Pulmonary veins drain normally to the left atrium.

Calcium Score: 546 Agatston units.

Coronary Arteries: Right dominant with no anomalies

LM: Mixed plaque ostial left main, minimal stenosis.

LAD system: Calcified plaque proximal LAD, mild (<50%) stenosis.
Mixed plaque mid LAD, possible moderate (51-69%) stenosis.

Circumflex system: Mixed plaque proximal LCx, mild (<50%) stenosis.
Misregistration artifact noted.

RCA system: Mixed plaque proximal and mid RCA, no more than mild
(<50%) stenosis.
IMPRESSION: 1. Coronary artery calcium score 546 Agatston units. This places the
patient in the 70th percentile for age and gender, suggesting
intermediate risk for future cardiac events.

2.  Mild disease in the RCA and LCx.

3.  Possible moderate mid LAD stenosis.  Will send for FFR.

ZULUAGA

*** End of Addendum ***
EXAM:
OVER-READ INTERPRETATION  CT CHEST

The following report is an over-read performed by radiologist Dr.
ZULUAGA [REDACTED] on [DATE]. This
over-read does not include interpretation of cardiac or coronary
anatomy or pathology. The coronary calcium score/coronary CTA
interpretation by the cardiologist is attached.
FINDINGS: Aortic atherosclerosis. Several small pulmonary nodules are noted
throughout the visualize lungs, largest of which is in the posterior
aspect of the left lower lobe (axial image 22 of series 12)
measuring 1.0 x 0.6 cm (mean diameter of 0.8 cm). Within the
visualized portions of the thorax there are no other larger more
suspicious appearing pulmonary nodules or masses, there is no acute
consolidative airspace disease, no pleural effusions, no
pneumothorax and no lymphadenopathy. Visualized portions of the
upper abdomen are unremarkable. There are no aggressive appearing
lytic or blastic lesions noted in the visualized portions of the
skeleton.
IMPRESSION: 1. Multiple small pulmonary nodules in the visualize lungs measuring
8 mm or less in size. Non-contrast chest CT at 3-6 months is
recommended. If the nodules are stable at time of repeat CT, then
future CT at 18-24 months (from today's scan) is considered optional
for low-risk patients, but is recommended for high-risk patients.
This recommendation follows the consensus statement: Guidelines for
Management of Incidental Pulmonary Nodules Detected on CT Images:
2.  Aortic Atherosclerosis ([8P]-[8P]).

## 2019-09-23 MED ORDER — NITROGLYCERIN 0.4 MG SL SUBL
0.8000 mg | SUBLINGUAL_TABLET | Freq: Once | SUBLINGUAL | Status: AC
  Administered 2019-09-23: 08:00:00 0.8 mg via SUBLINGUAL

## 2019-09-23 MED ORDER — IOHEXOL 350 MG/ML SOLN
100.0000 mL | Freq: Once | INTRAVENOUS | Status: AC | PRN
  Administered 2019-09-23: 100 mL via INTRAVENOUS

## 2019-09-23 MED ORDER — NITROGLYCERIN 0.4 MG SL SUBL
SUBLINGUAL_TABLET | SUBLINGUAL | Status: AC
  Filled 2019-09-23: qty 2

## 2019-09-24 ENCOUNTER — Ambulatory Visit (HOSPITAL_COMMUNITY)
Admission: RE | Admit: 2019-09-24 | Discharge: 2019-09-24 | Disposition: A | Discharge status: Home / Self Care | Payer: Medicare Other | Source: Ambulatory Visit | Attending: Physician Assistant | Admitting: Physician Assistant

## 2019-09-24 DIAGNOSIS — I208 Other forms of angina pectoris: Secondary | ICD-10-CM | POA: Diagnosis present

## 2019-09-25 ENCOUNTER — Other Ambulatory Visit: Payer: Self-pay | Admitting: Physician Assistant

## 2019-09-25 DIAGNOSIS — I208 Other forms of angina pectoris: Secondary | ICD-10-CM | POA: Diagnosis not present

## 2019-09-28 NOTE — Telephone Encounter (Signed)
Lasix has been discontinued as it was no effect.  Called pt and he didn't request it, so therefore will deny the refill. I didn't see a refill request so will route back to [place], LPN.

## 2019-09-28 NOTE — Addendum Note (Signed)
Addended by: Gaetano Net on: 09/28/2019 09:38 AM   Modules accepted: Orders

## 2019-10-12 ENCOUNTER — Other Ambulatory Visit: Payer: Self-pay | Admitting: Cardiovascular Disease

## 2019-11-07 ENCOUNTER — Other Ambulatory Visit: Payer: Self-pay | Admitting: Physician Assistant

## 2019-12-16 ENCOUNTER — Other Ambulatory Visit: Payer: Self-pay | Admitting: Cardiovascular Disease

## 2020-01-06 ENCOUNTER — Other Ambulatory Visit: Payer: Self-pay

## 2020-01-06 ENCOUNTER — Ambulatory Visit (HOSPITAL_COMMUNITY): Payer: Medicare Other | Attending: Cardiovascular Disease

## 2020-01-06 DIAGNOSIS — I4821 Permanent atrial fibrillation: Secondary | ICD-10-CM | POA: Diagnosis present

## 2020-01-09 ENCOUNTER — Other Ambulatory Visit: Payer: Self-pay | Admitting: Cardiovascular Disease

## 2020-01-09 ENCOUNTER — Ambulatory Visit: Payer: Medicare Other

## 2020-01-09 DIAGNOSIS — I4891 Unspecified atrial fibrillation: Secondary | ICD-10-CM

## 2020-01-11 NOTE — Telephone Encounter (Signed)
Pt last saw Chelsea Aus, PA on 08/31/19, last labs 09/01/19 Creat 0.87, age 74, weight 87.7kg, based on specified criteria pt is on appropriate dosage of Eliquis 5mg  BID.  Will refill rx.

## 2020-01-14 ENCOUNTER — Other Ambulatory Visit: Payer: Self-pay | Admitting: Family Medicine

## 2020-01-14 ENCOUNTER — Ambulatory Visit: Payer: Medicare Other

## 2020-01-14 DIAGNOSIS — R911 Solitary pulmonary nodule: Secondary | ICD-10-CM

## 2020-01-21 ENCOUNTER — Ambulatory Visit: Payer: Medicare Other

## 2020-01-22 ENCOUNTER — Other Ambulatory Visit: Payer: Self-pay

## 2020-01-22 ENCOUNTER — Ambulatory Visit
Admission: RE | Admit: 2020-01-22 | Discharge: 2020-01-22 | Disposition: A | Discharge status: Home / Self Care | Payer: Medicare Other | Source: Ambulatory Visit | Attending: Family Medicine | Admitting: Family Medicine

## 2020-01-22 DIAGNOSIS — R911 Solitary pulmonary nodule: Secondary | ICD-10-CM

## 2020-01-22 IMAGING — CT CT CHEST W/O CM
1 series · 15 of 34 positions shown, 19 images · non-contrast
Comparison: CT scan [DATE]

CLINICAL DATA: Followup lung nodule seen on cardiac CT scan
[DATE]

EXAM:
CT CHEST WITHOUT CONTRAST
TECHNIQUE: Multidetector CT imaging of the chest was performed following the
standard protocol without IV contrast.

[Series 2: chest w/(date) · axial · 0.76mm/px · z∈[-322,-36]mm · 15 of 169 slices shown, 19 images]
[im 13/169  mediastinal]
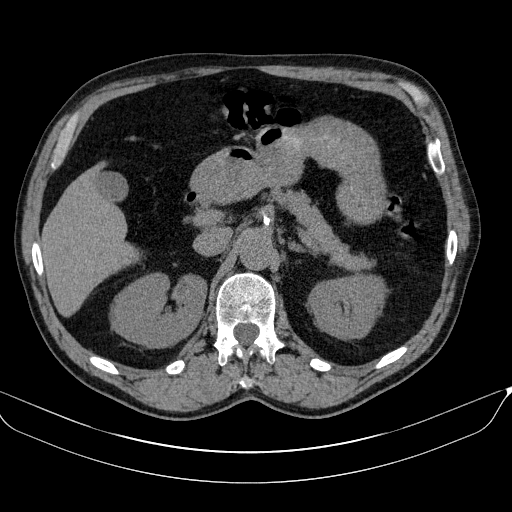
[im 13/169  lung]
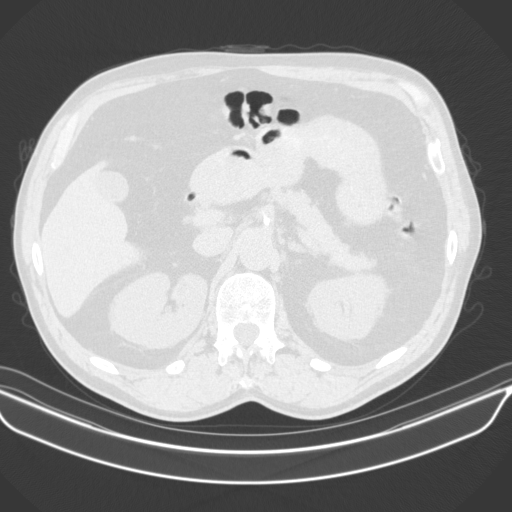
[im 25/169  lung]
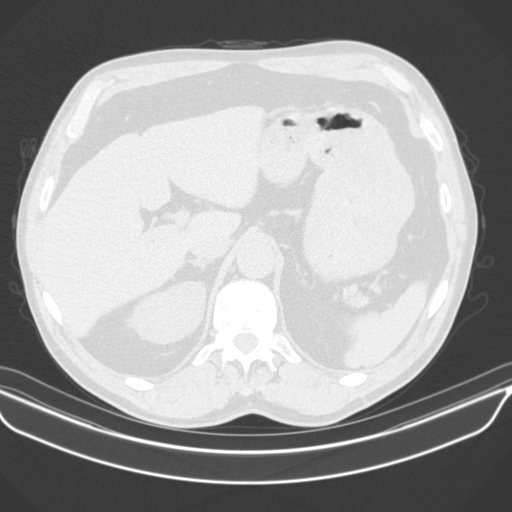
[im 34/169  lung]
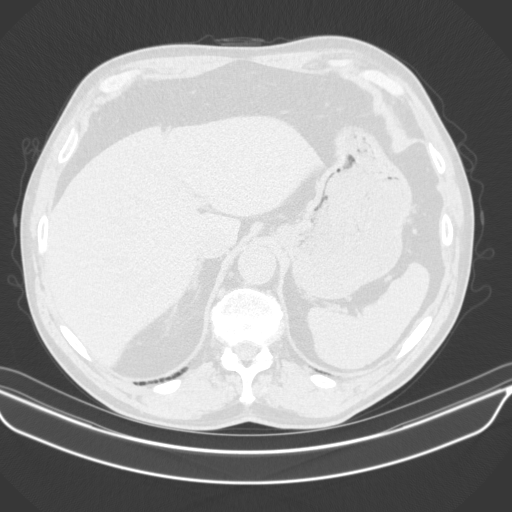
[im 44/169  lung]
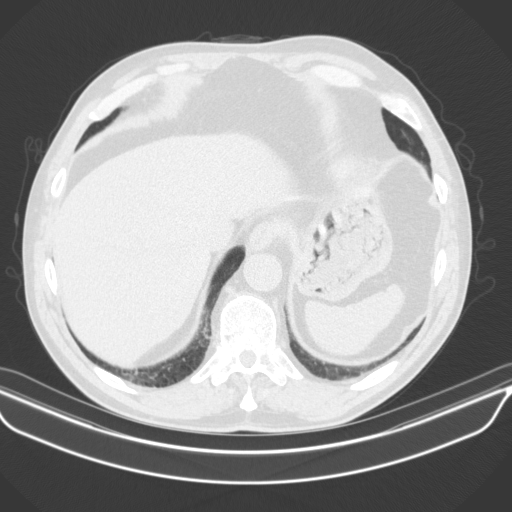
[im 57/169  mediastinal]
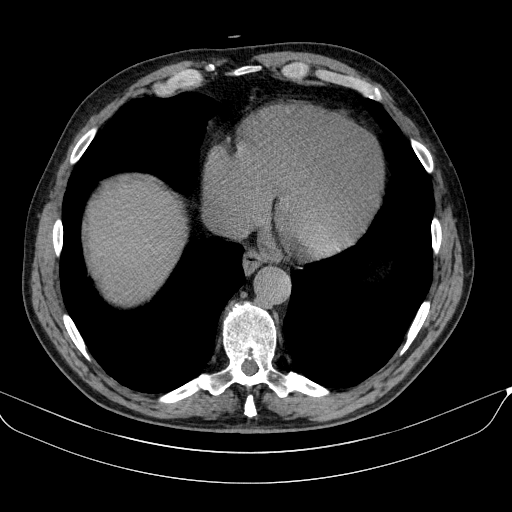
[im 57/169  lung]
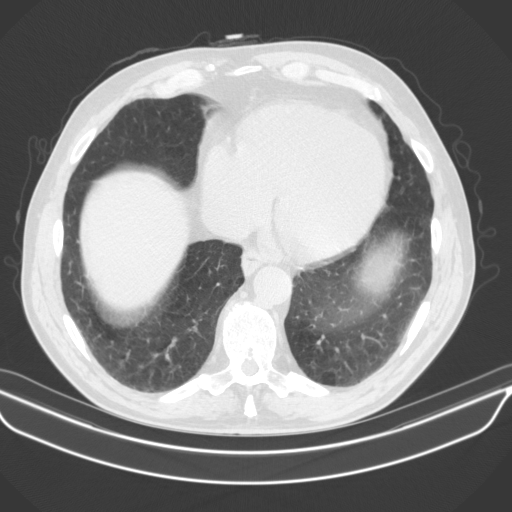
[im 68/169  lung]
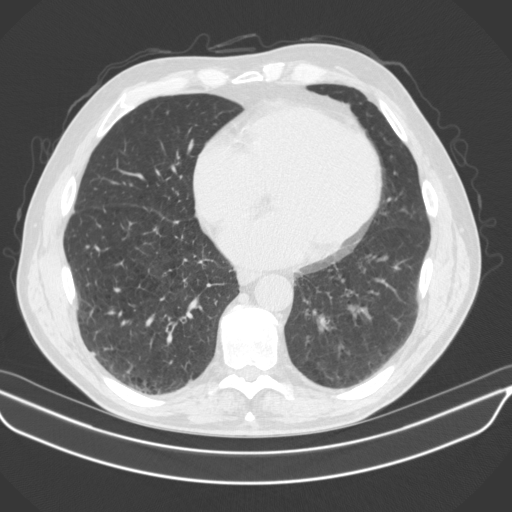
[im 75/169  lung]
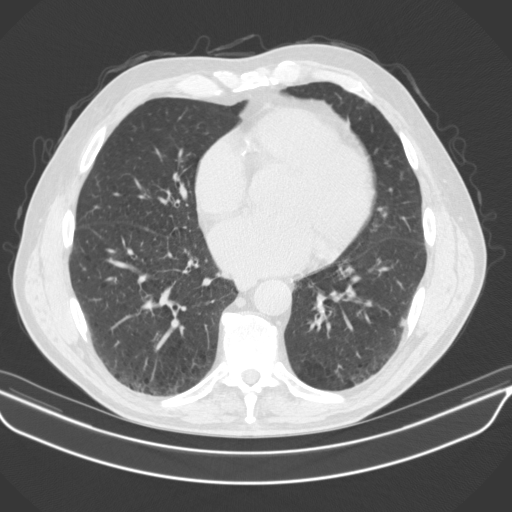
[im 88/169  lung]
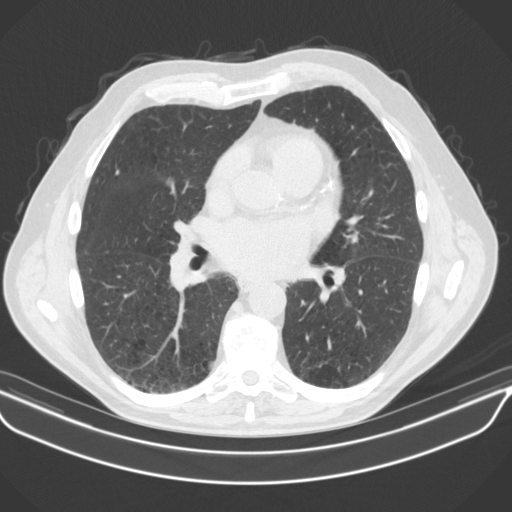
[im 94/169  mediastinal]
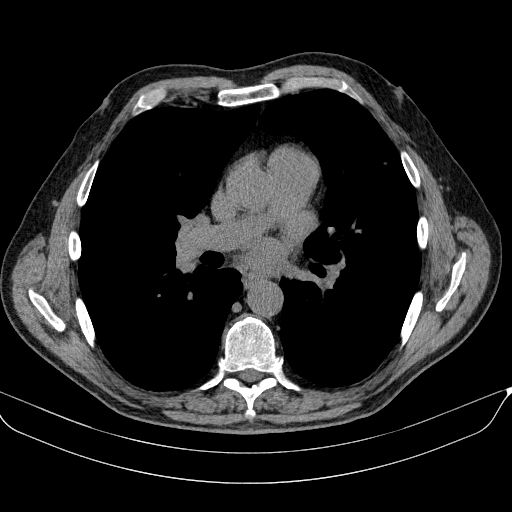
[im 94/169  lung]
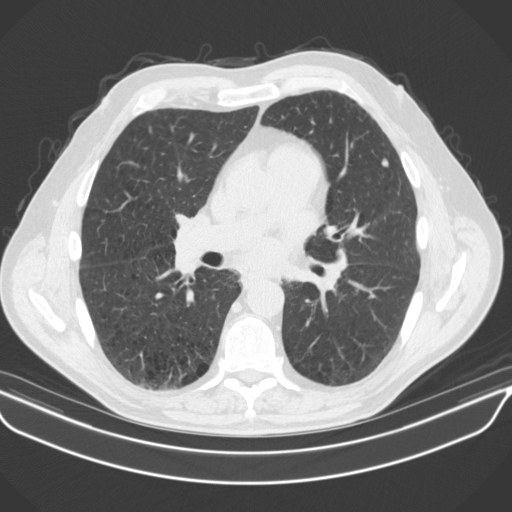
[im 101/169  lung]
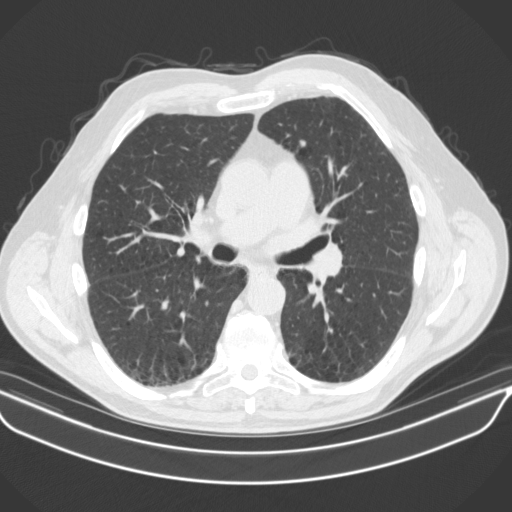
[im 113/169  lung]
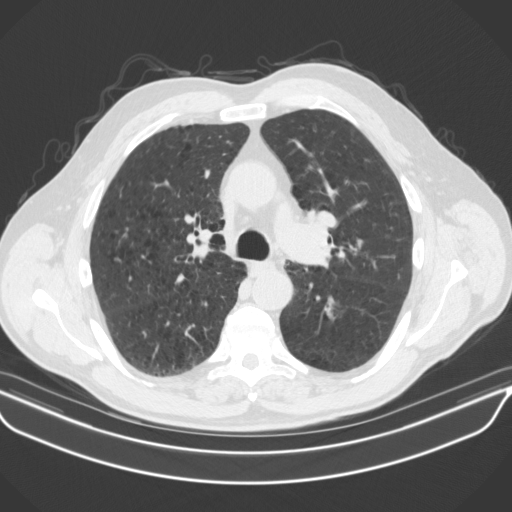
[im 125/169  lung]
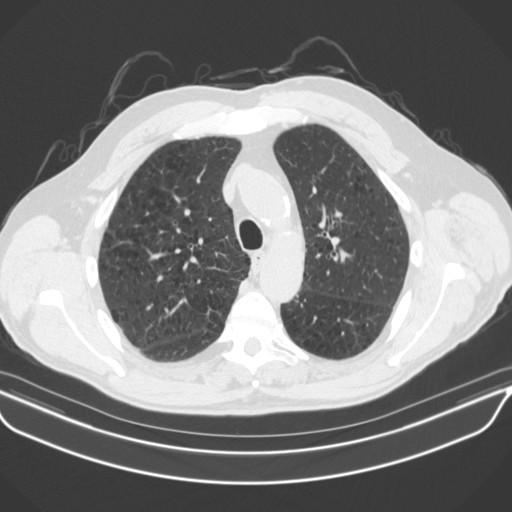
[im 135/169  mediastinal]
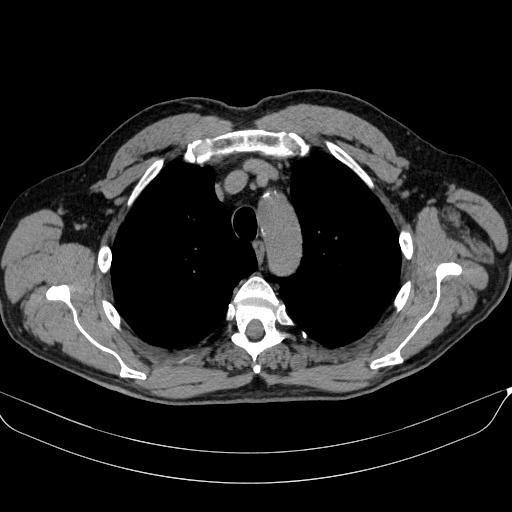
[im 135/169  lung]
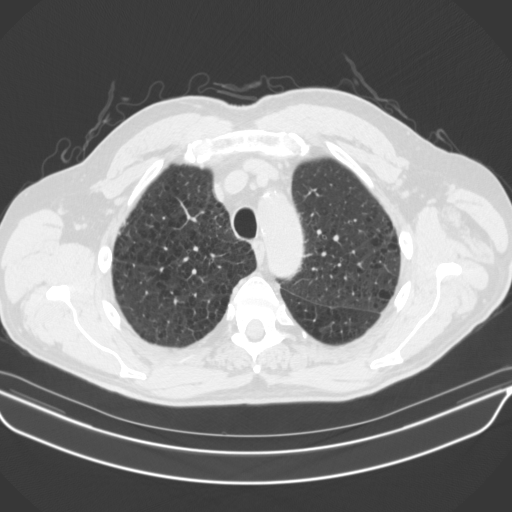
[im 144/169  lung]
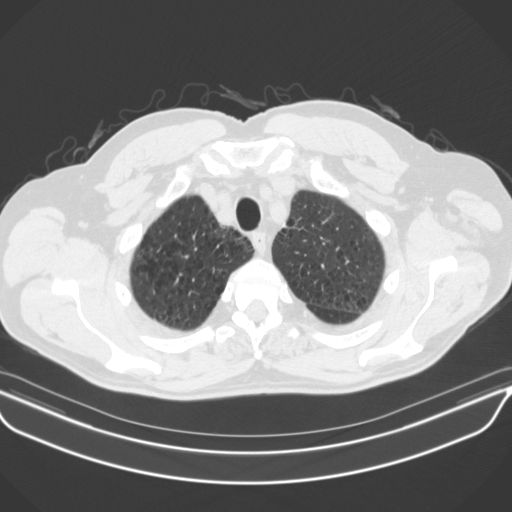
[im 156/169  lung]
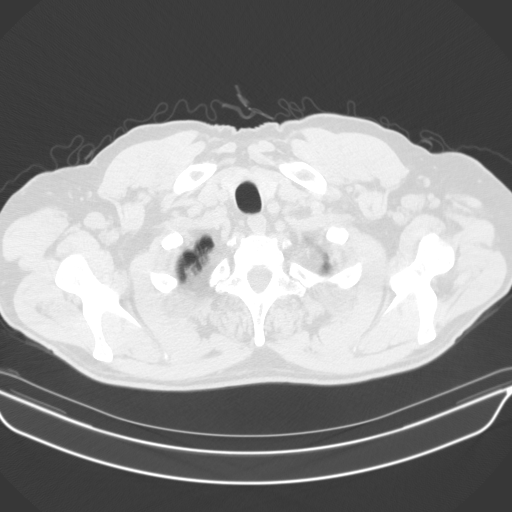

[15 of 34 positions shown; findings below may reference images not displayed]

FINDINGS: Cardiovascular: The heart is within normal limits in size and
stable. No pericardial effusion. There is mild tortuosity and
calcification of the thoracic aorta. No focal aneurysm. Stable
three-vessel coronary artery calcifications.

Mediastinum/Nodes: No mediastinal or hilar mass or adenopathy. Small
scattered lymph nodes are stable. The esophagus is grossly normal.

Lungs/Pleura: Stable advanced emphysematous changes and pulmonary
scarring. No infiltrates or effusions.

Bilateral lower lobe pulmonary nodules are again identified. These
are stable.

10 mm subpleural nodule in the left lower lobe on image 66/3.

6.5 mm right lower lobe nodule on image 78/3.

8 mm subpleural right lower lobe nodule on image 84/3.

9 mm left lower lobe nodule on image 93/3.

5.5 mm subpleural left lower lobe nodule on image 94/3.

No new pulmonary nodules or progressive findings. Stable calcified
granulomas noted in the right upper lobe.

Upper Abdomen: No significant upper abdominal findings. Stable
atherosclerotic calcifications involving the aorta and branch
vessels.

Musculoskeletal: No chest wall mass, supraclavicular or axillary
adenopathy. The thyroid gland appears normal.

No significant bony findings.
IMPRESSION: 1. Stable bilateral lower lobe pulmonary nodules. Recommend
continued surveillance. Followup noncontrast chest CT in 6-12 months
is suggested.
2. Stable advanced emphysematous changes and pulmonary scarring.
3. No acute overlying pulmonary process.
4. No mediastinal or hilar mass or adenopathy.
5. Stable atherosclerotic calcifications involving the aorta and
branch vessels including the coronary arteries.

Aortic Atherosclerosis ([YJ]-[YJ]) and Emphysema ([YJ]-[YJ]).

Aortic Atherosclerosis ([YJ]-[YJ]) and Emphysema ([YJ]-[YJ]).

## 2020-01-30 ENCOUNTER — Ambulatory Visit: Payer: Medicare Other

## 2020-02-01 ENCOUNTER — Other Ambulatory Visit (HOSPITAL_COMMUNITY): Payer: Self-pay | Admitting: Cardiovascular Disease

## 2020-02-01 ENCOUNTER — Other Ambulatory Visit: Payer: Self-pay

## 2020-02-01 ENCOUNTER — Telehealth: Payer: Self-pay

## 2020-02-01 ENCOUNTER — Ambulatory Visit (HOSPITAL_COMMUNITY)
Admission: RE | Admit: 2020-02-01 | Discharge: 2020-02-01 | Disposition: A | Discharge status: Home / Self Care | Payer: Medicare Other | Source: Ambulatory Visit | Attending: Internal Medicine | Admitting: Internal Medicine

## 2020-02-01 DIAGNOSIS — I714 Abdominal aortic aneurysm, without rupture, unspecified: Secondary | ICD-10-CM

## 2020-02-01 NOTE — Telephone Encounter (Signed)
Reviewed results with patient who verbalized understanding.   Repeat study ordered to be scheduled in 2 years. The patient agrees with treatment plan.

## 2020-02-01 NOTE — Telephone Encounter (Signed)
-----   Message from [name], MD sent at 02/01/2020  9:49 AM EST ----- Stable findings noted with no interval enlargement of the aorta.  2-year follow-up evaluation is recommended.

## 2020-02-25 ENCOUNTER — Encounter: Payer: Self-pay | Admitting: *Deleted

## 2020-02-25 ENCOUNTER — Encounter: Payer: Self-pay | Admitting: Cardiovascular Disease

## 2020-02-25 ENCOUNTER — Ambulatory Visit (INDEPENDENT_AMBULATORY_CARE_PROVIDER_SITE_OTHER): Payer: Medicare Other | Admitting: Cardiovascular Disease

## 2020-02-25 ENCOUNTER — Other Ambulatory Visit: Payer: Self-pay

## 2020-02-25 VITALS — BP 136/74 | HR 48 | Ht 72.0 in | Wt 200.0 lb

## 2020-02-25 DIAGNOSIS — I10 Essential (primary) hypertension: Secondary | ICD-10-CM | POA: Diagnosis not present

## 2020-02-25 DIAGNOSIS — I714 Abdominal aortic aneurysm, without rupture, unspecified: Secondary | ICD-10-CM

## 2020-02-25 DIAGNOSIS — I4819 Other persistent atrial fibrillation: Secondary | ICD-10-CM | POA: Diagnosis not present

## 2020-02-25 DIAGNOSIS — E782 Mixed hyperlipidemia: Secondary | ICD-10-CM

## 2020-02-25 DIAGNOSIS — I2510 Atherosclerotic heart disease of native coronary artery without angina pectoris: Secondary | ICD-10-CM | POA: Diagnosis not present

## 2020-02-25 NOTE — Patient Instructions (Signed)
Medication Instructions:  Your provider recommends that you continue on your current medications as directed. Please refer to the Current Medication list given to you today.   *If you need a refill on your cardiac medications before your next appointment, please call your pharmacy*  Testing/Procedures: Dr. Excell Seltzer recommends you wear a monitor prior to your next visit. This will be mailed to your home.  Follow-Up: You will be called to arrange your 6 month visit.

## 2020-02-25 NOTE — Progress Notes (Addendum)
Patient ID: CASTLE LAMONS, male   DOB: Jul 01, 1946, 74 y.o.   MRN: [id number] Patient to wear monitor prior to his follow up appointment with Dr. Burt Knack in September.  Patient enrolled for [employer] to mail a 3 day ZIO XT long term holter monitor to his home on 07/10/2020 to have results available by September.   Instructions sent to patient via My Chart message and will also be included in his monitor kit.

## 2020-02-25 NOTE — Progress Notes (Signed)
Cardiology Office Note:    Date:  02/25/2020   ID:  Leroy Lara, DOB 1946-11-16, MRN [id number]  PCP:  Richmond Campbell., PA-C  Cardiologist:  [name], MD  Electrophysiologist:  None   Referring MD: Richmond Campbell., PA-C   Chief Complaint  Patient presents with  . Shortness of Breath    History of Present Illness:    Leroy Lara is a 74 y.o. male with a hx of permanent atrial fibrillation with slow ventricular rate, hypertension, abdominal aortic aneurysm, and mild carotid stenosis.  He was last seen in our office in September 2020 when he complained of progressive shortness of breath.  He was evaluated with a coronary CTA which showed no evidence of high-grade coronary obstruction.  An echocardiogram was later done and this demonstrated normal LV systolic function.  Interestingly, he was shown to have an elevated BNP level.  The patient is here alone today.  He is doing well and reports stable symptoms.  He does have some generalized fatigue.  He mostly struggles with orthopedic issues related to arthritis.  He reports exertional dyspnea early on in activity but usually his breathing improves if he keeps going.  He has not had any chest pain or pressure.  He denies orthopnea, PND, or leg swelling.  He denies calf claudication symptoms.  Past Medical History:  Diagnosis Date  . AAA (abdominal aortic aneurysm) (HCC)   . Atrial fibrillation, permanent (HCC)   . Carotid artery calcification, bilateral   . Essential hypertension   . Gastroesophageal reflux disease     History reviewed. No pertinent surgical history.  Current Medications: Current Meds  Medication Sig  . clobetasol ointment (TEMOVATE) 0.05 %   . ELIQUIS 5 MG TABS tablet TAKE 1 TABLET BY MOUTH TWICE A DAY  . hydrochlorothiazide (HYDRODIURIL) 25 MG tablet TAKE 1 TABLET BY MOUTH EVERY DAY  . losartan (COZAAR) 100 MG tablet Take 1 tablet (100 mg total) by mouth daily.  Marland Kitchen omeprazole (PRILOSEC) 40 MG  capsule Take 40 mg by mouth daily.  . rosuvastatin (CRESTOR) 20 MG tablet TAKE 1 TABLET BY MOUTH EVERY DAY     Allergies:   Sulfamethoxazole, Sulfasalazine, and Sulfonamide derivatives   Social History   Socioeconomic History  . Marital status: Divorced    Spouse name: Not on file  . Number of children: Not on file  . Years of education: Not on file  . Highest education level: Not on file  Occupational History  . Not on file  Tobacco Use  . Smoking status: Former Games developer  . Smokeless tobacco: Never Used  Substance and Sexual Activity  . Alcohol use: Never  . Drug use: Never  . Sexual activity: Not on file  Other Topics Concern  . Not on file  Social History Narrative  . Not on file   Social Determinants of Health   Financial Resource Strain:   . Difficulty of Paying Living Expenses:   Food Insecurity:   . Worried About Programme researcher, broadcasting/film/video in the Last Year:   . Barista in the Last Year:   Transportation Needs:   . Freight forwarder (Medical):   Marland Kitchen Lack of Transportation (Non-Medical):   Physical Activity:   . Days of Exercise per Week:   . Minutes of Exercise per Session:   Stress:   . Feeling of Stress :   Social Connections:   . Frequency of Communication with Friends and Family:   . Frequency  of Social Gatherings with Friends and Family:   . Attends Religious Services:   . Active Member of Clubs or Organizations:   . Attends Banker Meetings:   Marland Kitchen Marital Status:      Family History: The patient's family history includes Cancer - Lung in his father; Coronary artery disease (age of onset: 50) in his mother; Heart failure in his sister; Lymphoma (age of onset: 23) in his brother; Pulmonary fibrosis in his brother.  ROS:   Please see the history of present illness.    All other systems reviewed and are negative.  EKGs/Labs/Other Studies Reviewed:    The following studies were reviewed today: CT-FFR 09-24-2019: IMPRESSION: 1.  Coronary artery calcium score 546 Agatston units. This places the patient in the 70th percentile for age and gender, suggesting intermediate risk for future cardiac events.  2.  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  3.  Possible moderate mid LAD stenosis.  Will send for FFR.  FINDINGS: No modeled stenoses > 30%.  IMPRESSION: No evidence for hemodynamically significant coronary disease.  Echo 01/06/2020: IMPRESSIONS    1. Left ventricular ejection fraction, by visual estimation, is 60 to  65%. The left ventricle has normal function. There is no left ventricular  hypertrophy.  2. Left ventricular diastolic parameters are indeterminate.  3. The left ventricle has no regional wall motion abnormalities.  4. Global right ventricle has normal systolic function.The right  ventricular size is normal. No increase in right ventricular wall  thickness.  5. Left atrial size was moderately dilated.  6. Right atrial size was mildly dilated.  7. The mitral valve is normal in structure. Trivial mitral valve  regurgitation. No evidence of mitral stenosis.  8. The tricuspid valve is normal in structure.  9. The tricuspid valve is normal in structure. Tricuspid valve  regurgitation is not demonstrated.  10. The aortic valve is tricuspid. Aortic valve regurgitation is trivial.  Mild aortic valve sclerosis without stenosis.  11. The pulmonic valve was normal in structure. Pulmonic valve  regurgitation is not visualized.  12. Normal pulmonary artery systolic pressure.  13. The inferior vena cava is normal in size with greater than 50%  respiratory variability, suggesting right atrial pressure of 3 mmHg.   Abdominal US 02-01-2020: Summary:  Abdominal Aorta: There is evidence of abnormal dilatation of the distal  Abdominal aorta. The largest aortic measurement is 3.0 cm. The largest  aortic diameter remains essentially unchanged compared to prior exam.  Previous diameter measurement was  3.2 cm  obtained on 01/2019.  IVC/Iliac: There is no evidence of thrombus involving the IVC.   EKG:  EKG is not ordered today.   Recent Labs: 09/01/2019: ALT 18; BUN 15; Creatinine, Ser 0.87; Hemoglobin 14.7; NT-Pro BNP 967; Platelets 281; Potassium 4.4; Sodium 137  Recent Lipid Panel    Component Value Date/Time   CHOL 131 09/01/2019 0750   TRIG 89 09/01/2019 0750   HDL 52 09/01/2019 0750   CHOLHDL 2.5 09/01/2019 0750   CHOLHDL 3.0 01/10/2016 0759   VLDL 19 01/10/2016 0759   LDLCALC 62 09/01/2019 0750   LDLDIRECT 154.8 08/26/2013 0739    Physical Exam:    VS:  BP 136/74   Pulse (!) 48   Ht 6' (1.829 m)   Wt 200 lb (90.7 kg)   SpO2 98%   BMI 27.12 kg/m     Wt Readings from Last 3 Encounters:  02/25/20 200 lb (90.7 kg)  08/31/19 193 lb 6.4 oz (  87.7 kg)  02/16/19 203 lb (92.1 kg)     GEN:  Well nourished, well developed in no acute distress HEENT: Normal NECK: No JVD; No carotid bruits LYMPHATICS: No lymphadenopathy CARDIAC: Irregularly irregular, no murmurs, rubs, gallops RESPIRATORY:  Clear to auscultation without rales, wheezing or rhonchi  ABDOMEN: Soft, non-tender, non-distended MUSCULOSKELETAL:  No edema; No deformity  SKIN: Warm and dry NEUROLOGIC:  Alert and oriented x 3 PSYCHIATRIC:  Normal affect   ASSESSMENT:    1. Persistent atrial fibrillation ([place])   2. AAA (abdominal aortic aneurysm) without rupture ([place])   3. Mixed hyperlipidemia   4. Essential hypertension   5. Coronary artery disease involving native coronary artery of native heart without angina pectoris    PLAN:    In order of problems listed above:  1. Anticoagulated with apixaban.  Heart rate remains slow with no symptoms of lightheadedness or presyncope.  Will check a monitor prior to his next office visit in 6 months to evaluate for bradycardia arrhythmias.  This was done about 18 months ago and did not show any pathologic pauses or significant bradycardic events. 2. Stable 3.2 cm  infrarenal abdominal aortic aneurysm on most recent duplex scan.  Continue with surveillance. 3. Treated with rosuvastatin 20 mg daily.  Has a lot of aches and pains and I do not think he will tolerate more aggressive therapy.  Continue current regimen. 4. Blood pressure is well controlled on hydrochlorothiazide and losartan. 5. No anginal symptoms.  Gated coronary CTA is reviewed.  He did not have any flow-limiting stenoses identified.  He continues on oral anticoagulation and a high intensity statin drug.   Medication Adjustments/Labs and Tests Ordered: Current medicines are reviewed at length with the patient today.  Concerns regarding medicines are outlined above.  Orders Placed This Encounter  Procedures  . LONG TERM MONITOR (3-14 DAYS)   No orders of the defined types were placed in this encounter.   Patient Instructions  Medication Instructions:  Your provider recommends that you continue on your current medications as directed. Please refer to the Current Medication list given to you today.   *If you need a refill on your cardiac medications before your next appointment, please call your pharmacy*  Testing/Procedures: Dr. Burt Knack recommends you wear a monitor prior to your next visit. This will be mailed to your home.  Follow-Up: You will be called to arrange your 6 month visit.    Signed, Sherren Mocha, MD  02/25/2020 1:04 PM    [place]

## 2020-06-27 ENCOUNTER — Emergency Department (HOSPITAL_COMMUNITY): Payer: Medicare Other

## 2020-06-27 ENCOUNTER — Emergency Department (HOSPITAL_COMMUNITY)
Admission: EM | Admit: 2020-06-27 | Discharge: 2020-06-27 | Disposition: A | Discharge status: Home / Self Care | Payer: Medicare Other | Attending: Emergency Medicine | Admitting: Emergency Medicine

## 2020-06-27 ENCOUNTER — Encounter (HOSPITAL_COMMUNITY): Payer: Self-pay | Admitting: *Deleted

## 2020-06-27 ENCOUNTER — Other Ambulatory Visit: Payer: Self-pay

## 2020-06-27 DIAGNOSIS — I10 Essential (primary) hypertension: Secondary | ICD-10-CM | POA: Insufficient documentation

## 2020-06-27 DIAGNOSIS — W19XXXA Unspecified fall, initial encounter: Secondary | ICD-10-CM

## 2020-06-27 DIAGNOSIS — Z79899 Other long term (current) drug therapy: Secondary | ICD-10-CM | POA: Diagnosis not present

## 2020-06-27 DIAGNOSIS — R109 Unspecified abdominal pain: Secondary | ICD-10-CM | POA: Insufficient documentation

## 2020-06-27 DIAGNOSIS — Y9389 Activity, other specified: Secondary | ICD-10-CM | POA: Insufficient documentation

## 2020-06-27 DIAGNOSIS — K219 Gastro-esophageal reflux disease without esophagitis: Secondary | ICD-10-CM | POA: Insufficient documentation

## 2020-06-27 DIAGNOSIS — Z87891 Personal history of nicotine dependence: Secondary | ICD-10-CM | POA: Insufficient documentation

## 2020-06-27 DIAGNOSIS — Y9289 Other specified places as the place of occurrence of the external cause: Secondary | ICD-10-CM | POA: Diagnosis not present

## 2020-06-27 DIAGNOSIS — Y999 Unspecified external cause status: Secondary | ICD-10-CM | POA: Diagnosis not present

## 2020-06-27 DIAGNOSIS — S20311A Abrasion of right front wall of thorax, initial encounter: Secondary | ICD-10-CM | POA: Insufficient documentation

## 2020-06-27 DIAGNOSIS — R52 Pain, unspecified: Secondary | ICD-10-CM

## 2020-06-27 DIAGNOSIS — S2231XA Fracture of one rib, right side, initial encounter for closed fracture: Secondary | ICD-10-CM | POA: Diagnosis not present

## 2020-06-27 DIAGNOSIS — W010XXA Fall on same level from slipping, tripping and stumbling without subsequent striking against object, initial encounter: Secondary | ICD-10-CM | POA: Diagnosis not present

## 2020-06-27 DIAGNOSIS — S4992XA Unspecified injury of left shoulder and upper arm, initial encounter: Secondary | ICD-10-CM | POA: Diagnosis present

## 2020-06-27 LAB — COMPREHENSIVE METABOLIC PANEL
ALT: 22 U/L (ref 0–44)
AST: 23 U/L (ref 15–41)
Albumin: 4 g/dL (ref 3.5–5.0)
Alkaline Phosphatase: 54 U/L (ref 38–126)
Anion gap: 9 (ref 5–15)
BUN: 16 mg/dL (ref 8–23)
CO2: 26 mmol/L (ref 22–32)
Calcium: 8.7 mg/dL — ABNORMAL LOW (ref 8.9–10.3)
Chloride: 99 mmol/L (ref 98–111)
Creatinine, Ser: 0.91 mg/dL (ref 0.61–1.24)
GFR calc Af Amer: >60 mL/min (ref >60)
GFR calc non Af Amer: >60 mL/min (ref >60)
Glucose, Bld: 99 mg/dL (ref 70–99)
Potassium: 4.2 mmol/L (ref 3.5–5.1)
Sodium: 134 mmol/L — ABNORMAL LOW (ref 135–145)
Total Bilirubin: 0.6 mg/dL (ref 0.3–1.2)
Total Protein: 7.6 g/dL (ref 6.5–8.1)

## 2020-06-27 LAB — CBC
HCT: 42.5 % (ref 39.0–52.0)
Hemoglobin: 14.4 g/dL (ref 13.0–17.0)
MCH: 32.8 pg (ref 26.0–34.0)
MCHC: 33.9 g/dL (ref 30.0–36.0)
MCV: 96.8 fL (ref 80.0–100.0)
Platelets: 249 10*3/uL (ref 150–400)
RBC: 4.39 MIL/uL (ref 4.22–5.81)
RDW: 12.1 % (ref 11.5–15.5)
WBC: 10.2 10*3/uL (ref 4.0–10.5)
nRBC: 0 % (ref 0.0–0.2)

## 2020-06-27 IMAGING — DX DG SHOULDER 2+V*L*
3 series · 3 of 3 positions shown · non-contrast
Comparison: None.

CLINICAL DATA: Recent fall with left shoulder pain, initial
encounter

EXAM:
LEFT SHOULDER - 2+ VIEW

[shoulder grashey]
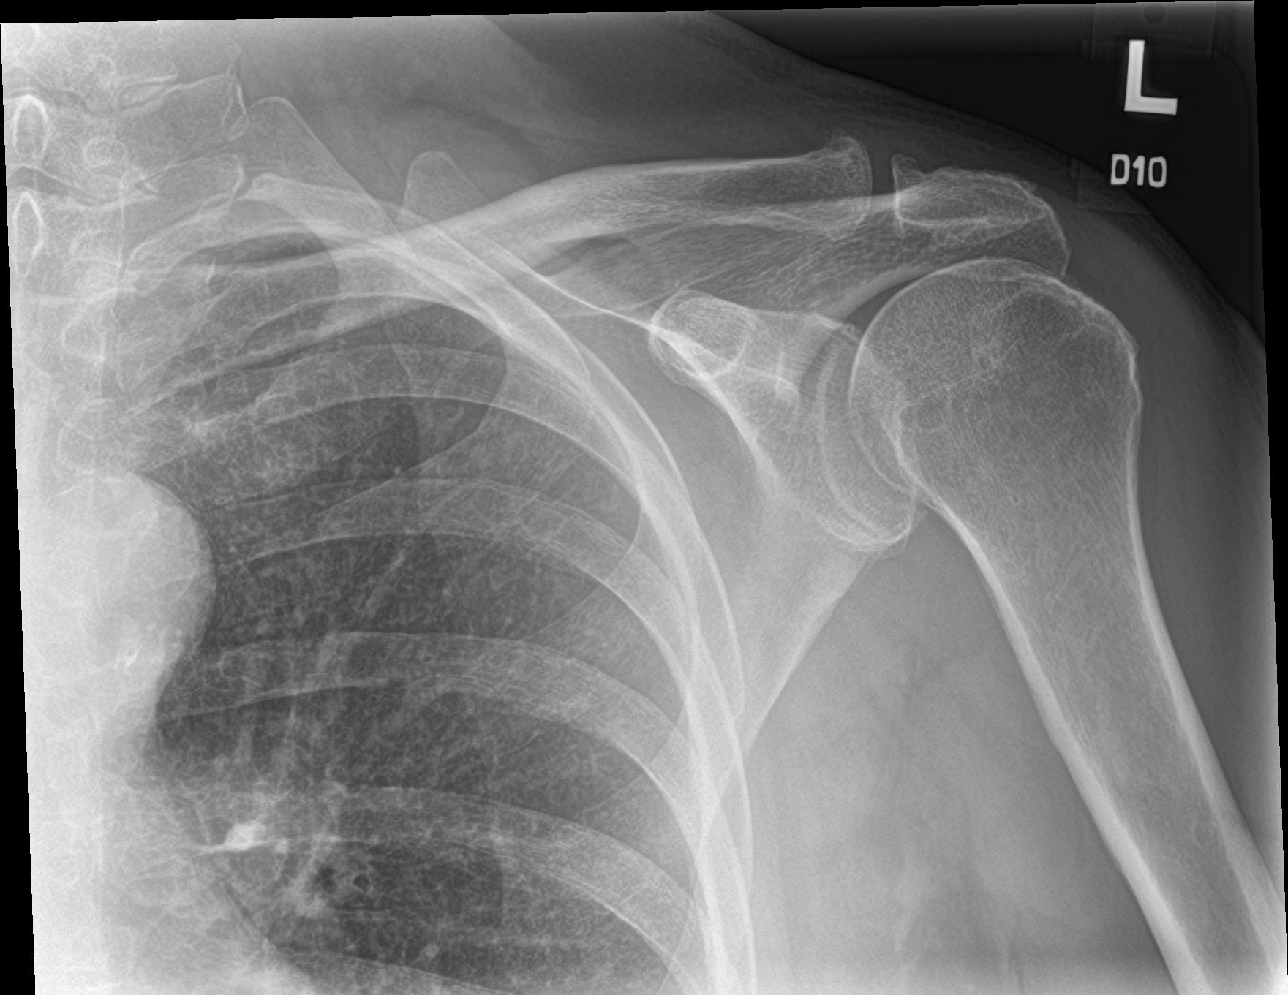

[shoulder y view]
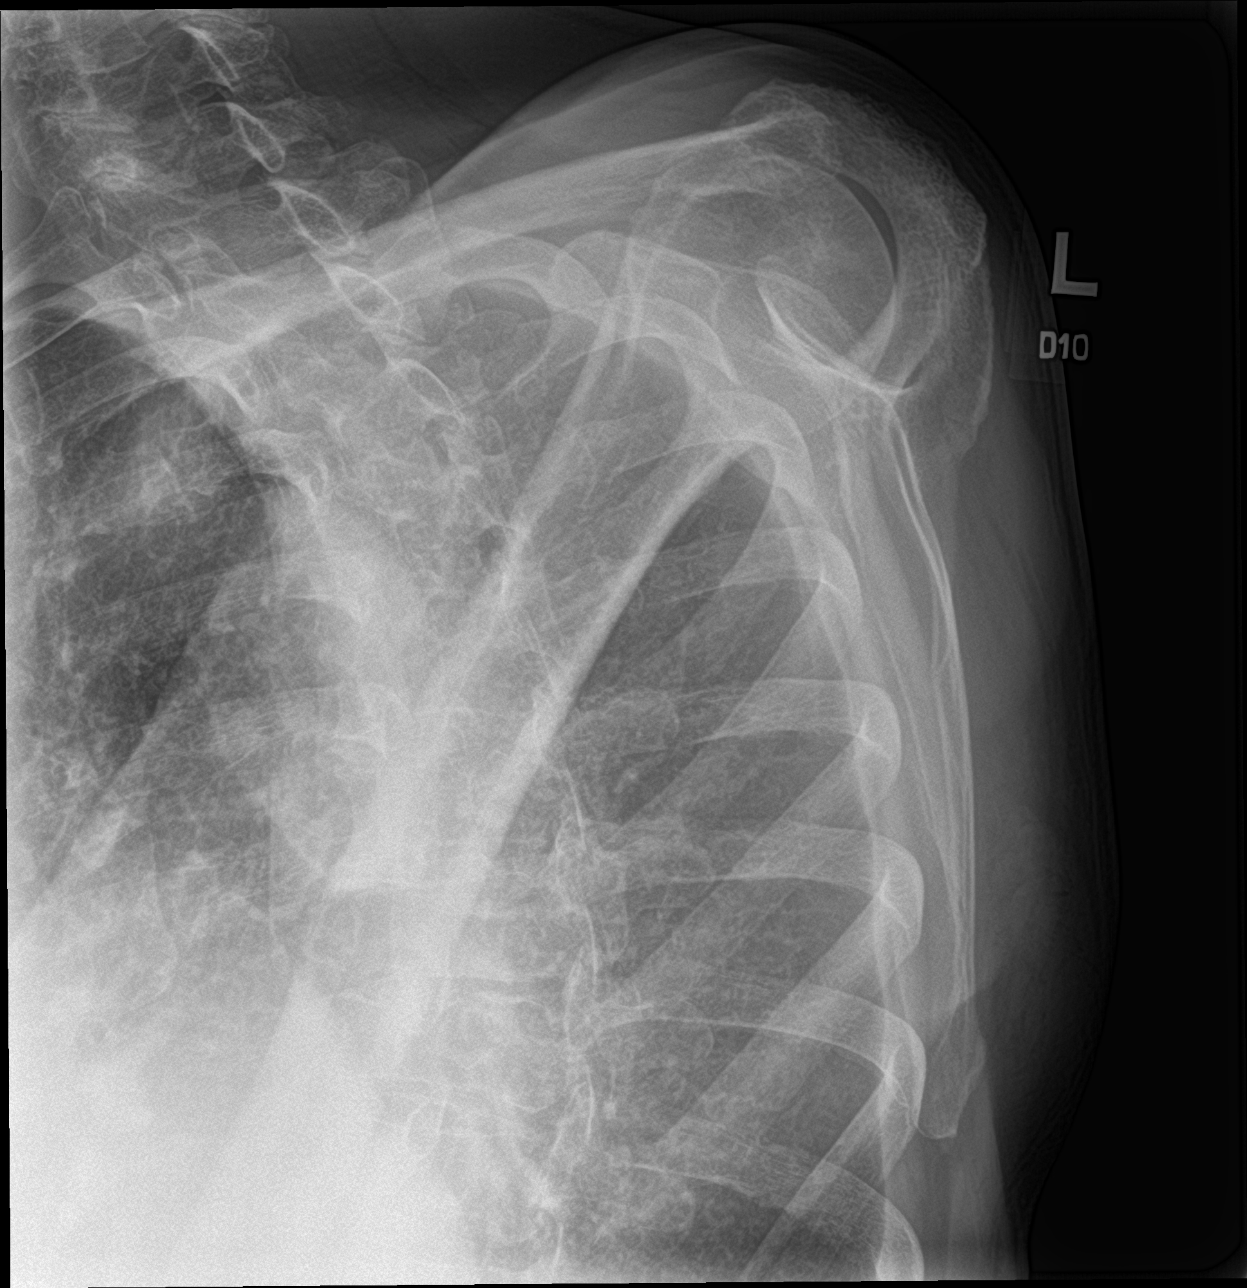

[shoulder axillary]
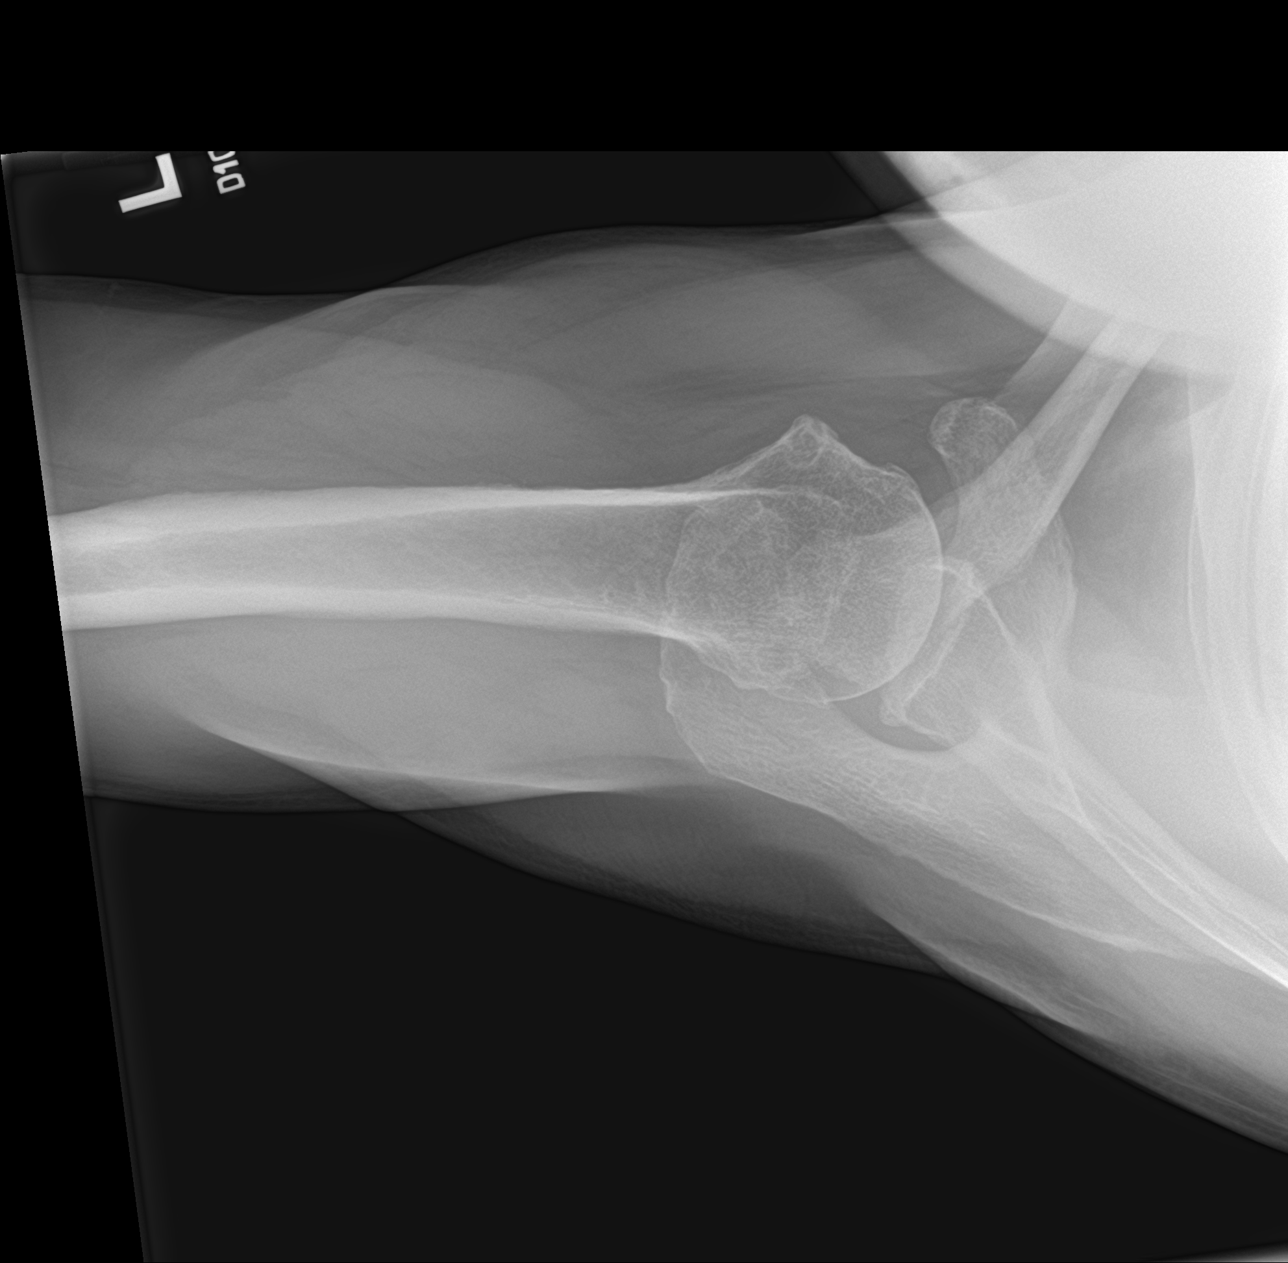

[3 of 3 positions shown; findings below may reference images not displayed]

FINDINGS: Degenerative changes of the acromioclavicular joint are noted. No
acute fracture or dislocation is seen. No new lying bony thorax
appears within normal limits. No soft tissue abnormality is noted.
IMPRESSION: Degenerative change without acute abnormality.

## 2020-06-27 IMAGING — CT CT ABD-PELV W/ CM
2 of 5 series · 15 of 46 positions shown, 17 images · IV contrast (Omnipaque or Isovue)
Comparison: [DATE]

CLINICAL DATA: Fall from deck at home today now with pain and right
flank region.

EXAM:
CT ABDOMEN AND PELVIS WITH CONTRAST
TECHNIQUE: Multidetector CT imaging of the abdomen and pelvis was performed
using the standard protocol following bolus administration of
intravenous contrast.
CONTRAST:  100mL OMNIPAQUE IOHEXOL 300 MG/ML  SOLN

[Series 2: axial st · axial · 0.72mm/px · z∈[+939,+1364]mm · 12 of 97 slices shown, 14 images]
[im 6/97  soft-tissue]
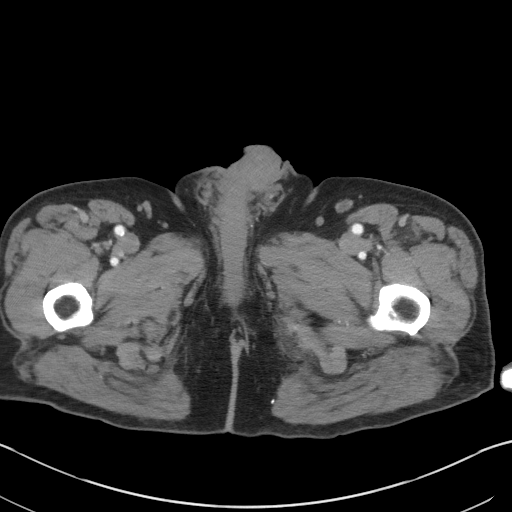
[im 6/97  bone]
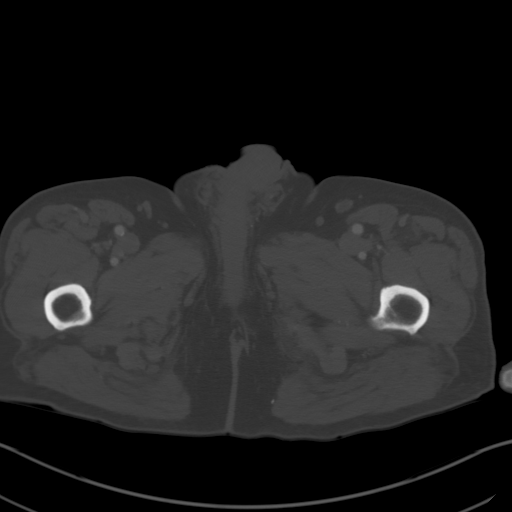
[im 16/97  soft-tissue]
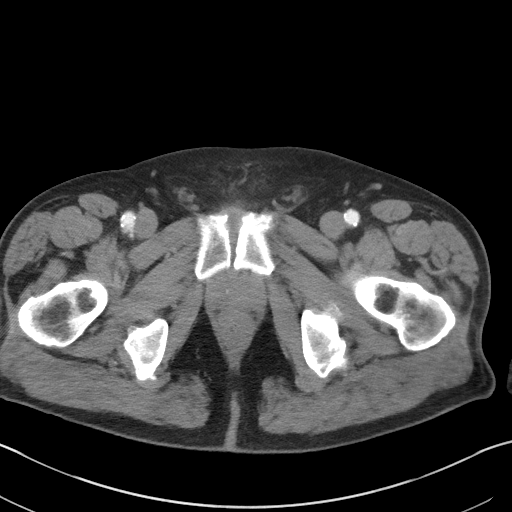
[im 21/97  soft-tissue]
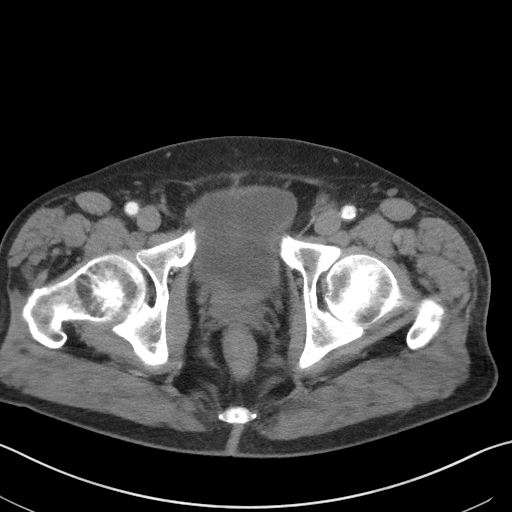
[im 31/97  soft-tissue]
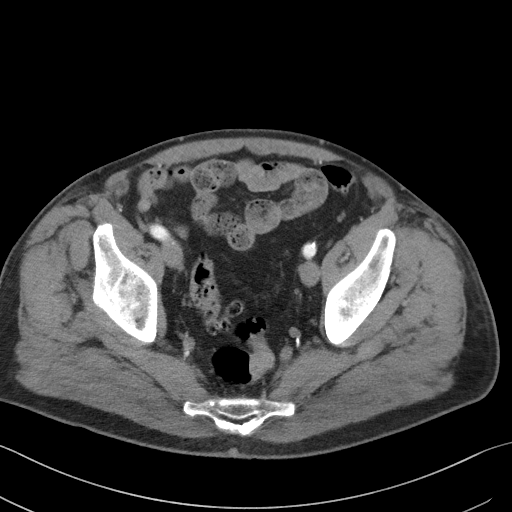
[im 36/97  soft-tissue]
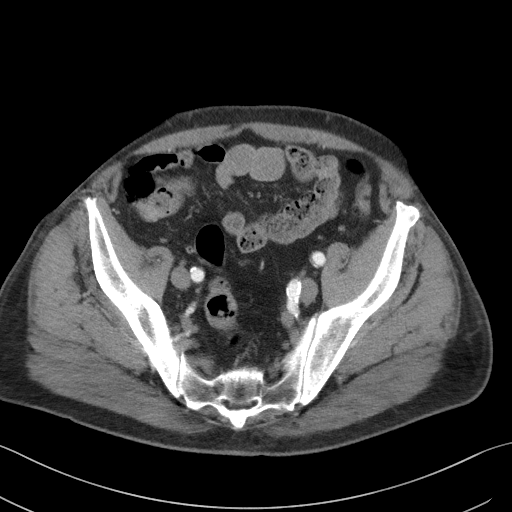
[im 46/97  soft-tissue]
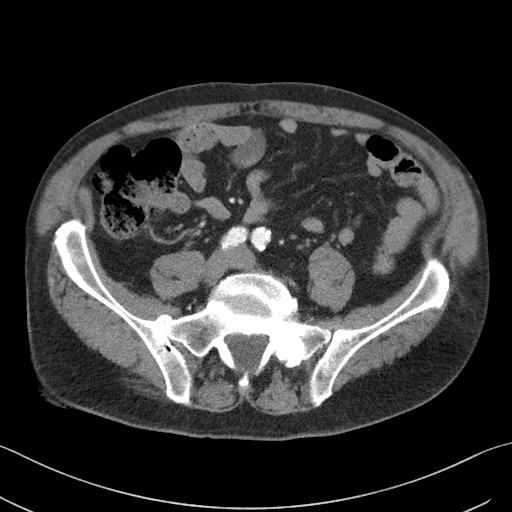
[im 51/97  soft-tissue]
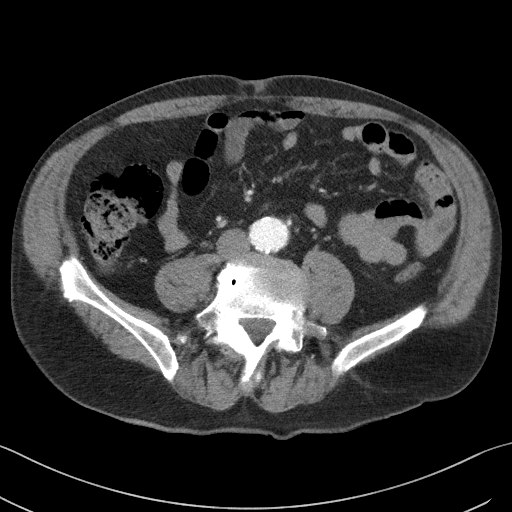
[im 61/97  soft-tissue]
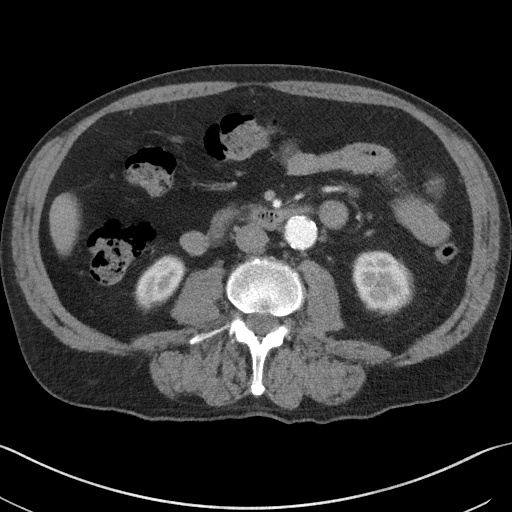
[im 66/97  soft-tissue]
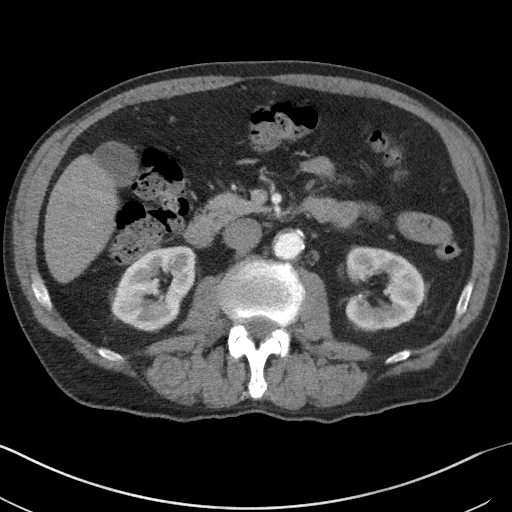
[im 66/97  bone]
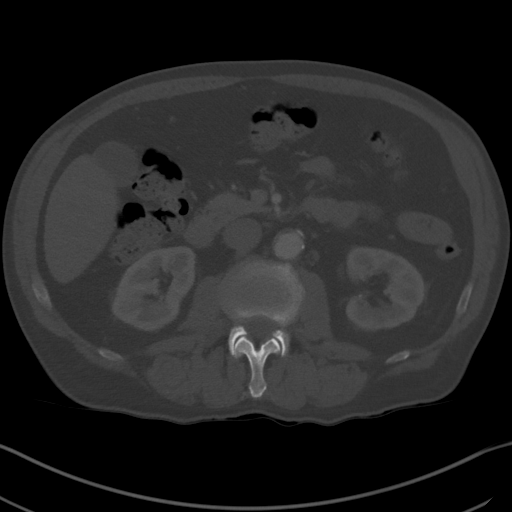
[im 76/97  soft-tissue]
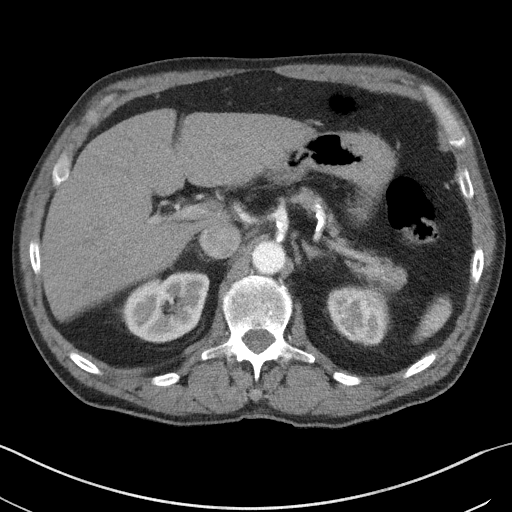
[im 81/97  soft-tissue]
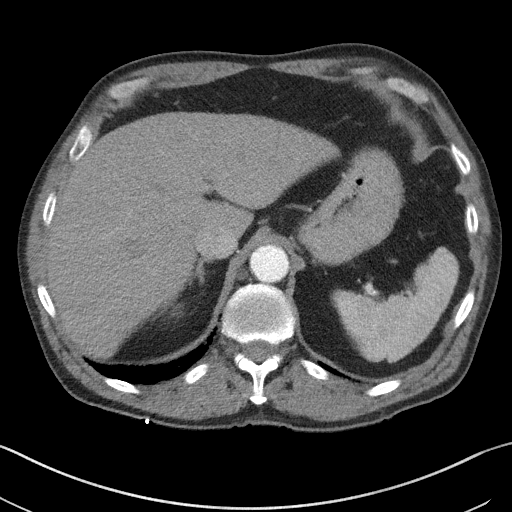
[im 91/97  soft-tissue]
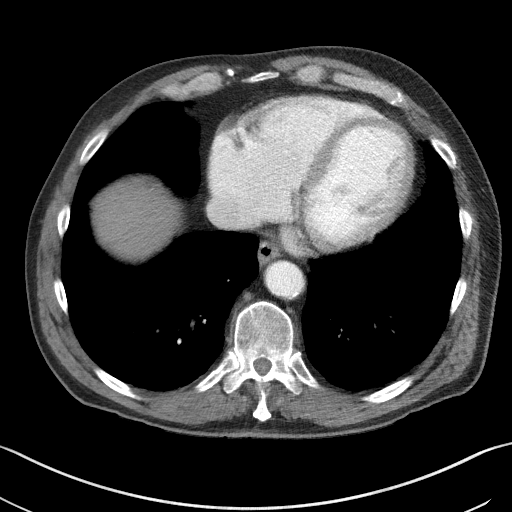

[Series 5: coronal st · coronal · 0.70mm/px · 3 of 92 slices shown]
[im 31/92  soft-tissue]
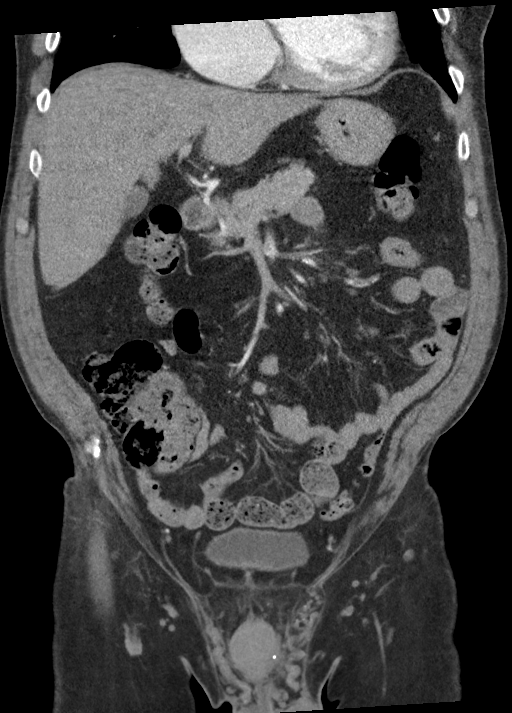
[im 41/92  soft-tissue]
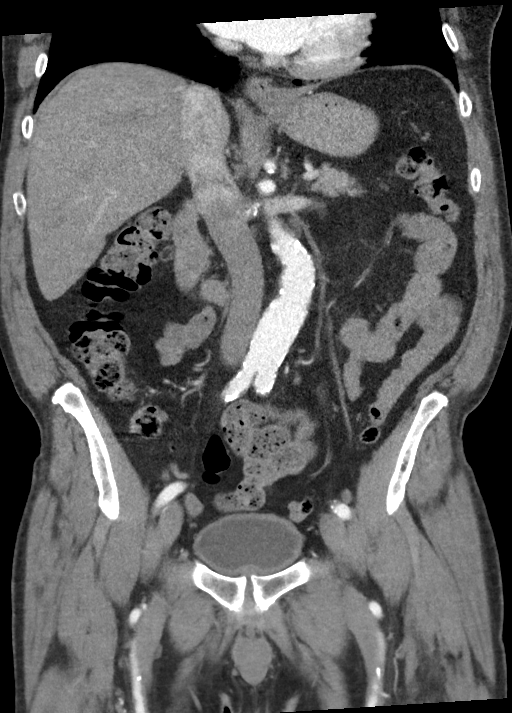
[im 51/92  soft-tissue]
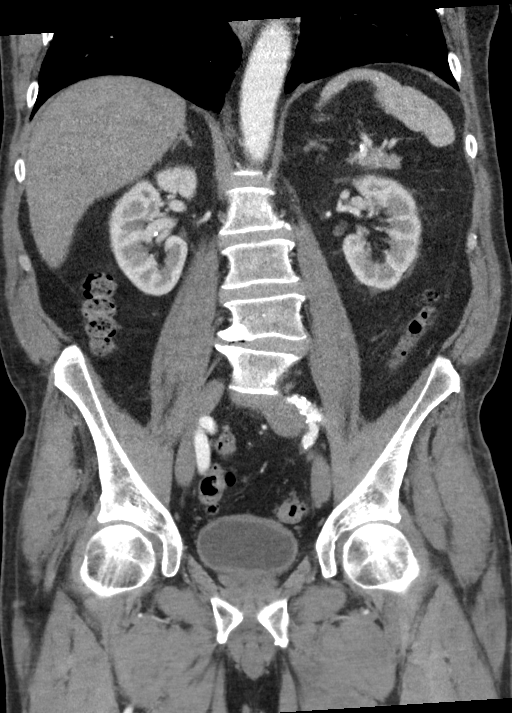

[15 of 46 positions shown; findings below may reference images not displayed]

FINDINGS: Lower chest: 5 mm subpleural nodule within the lateral left lower
lobe is unchanged, image number [DATE]. No acute abnormality noted
within the imaged portions of the lungs. Moderate changes of
emphysema.

Hepatobiliary: No hepatic injury or perihepatic hematoma.
Gallbladder is unremarkable

Pancreas: Unremarkable. No pancreatic ductal dilatation or
surrounding inflammatory changes.

Spleen: Normal in size without focal abnormality.

Adrenals/Urinary Tract: No adrenal hemorrhage or renal injury
identified. Bladder is unremarkable.

Stomach/Bowel: The stomach appears within normal limits. There is no
bowel wall thickening, inflammation or distension identified. The
appendix is visualized and appears normal. Sigmoid diverticular
disease is identified without acute inflammation.

Vascular/Lymphatic: Aortic atherosclerosis. Infrarenal abdominal
aortic ectasia measures 2.9 cm, image 44/2. No abdominal or pelvic
adenopathy identified.

Reproductive: Prostate is unremarkable.

Other: No free fluid or fluid collections within the abdomen or
pelvis.

Musculoskeletal: Mild curvature of the lumbar spine is convex
towards the left. Multilevel degenerative disc disease noted.
Nondisplaced fracture involving the distal aspect of the right
twelfth rib is noted along the posterior right flank, image 34/2.
IMPRESSION: 1. Nondisplaced fracture involving the distal aspect of the right
twelfth rib is noted along the posterior right flank.
2. No signs of solid organ injury.
3. Emphysema and aortic atherosclerosis.

Aortic Atherosclerosis ([VF]-[VF]) and Emphysema ([VF]-[VF]).

## 2020-06-27 IMAGING — DX DG RIBS W/ CHEST 3+V*R*
4 series · 4 of 4 positions shown · non-contrast
Comparison: None.

CLINICAL DATA: Recent fall with right-sided chest pain, initial
encounter

EXAM:
RIGHT RIBS AND CHEST - 3+ VIEW

[chest pa]
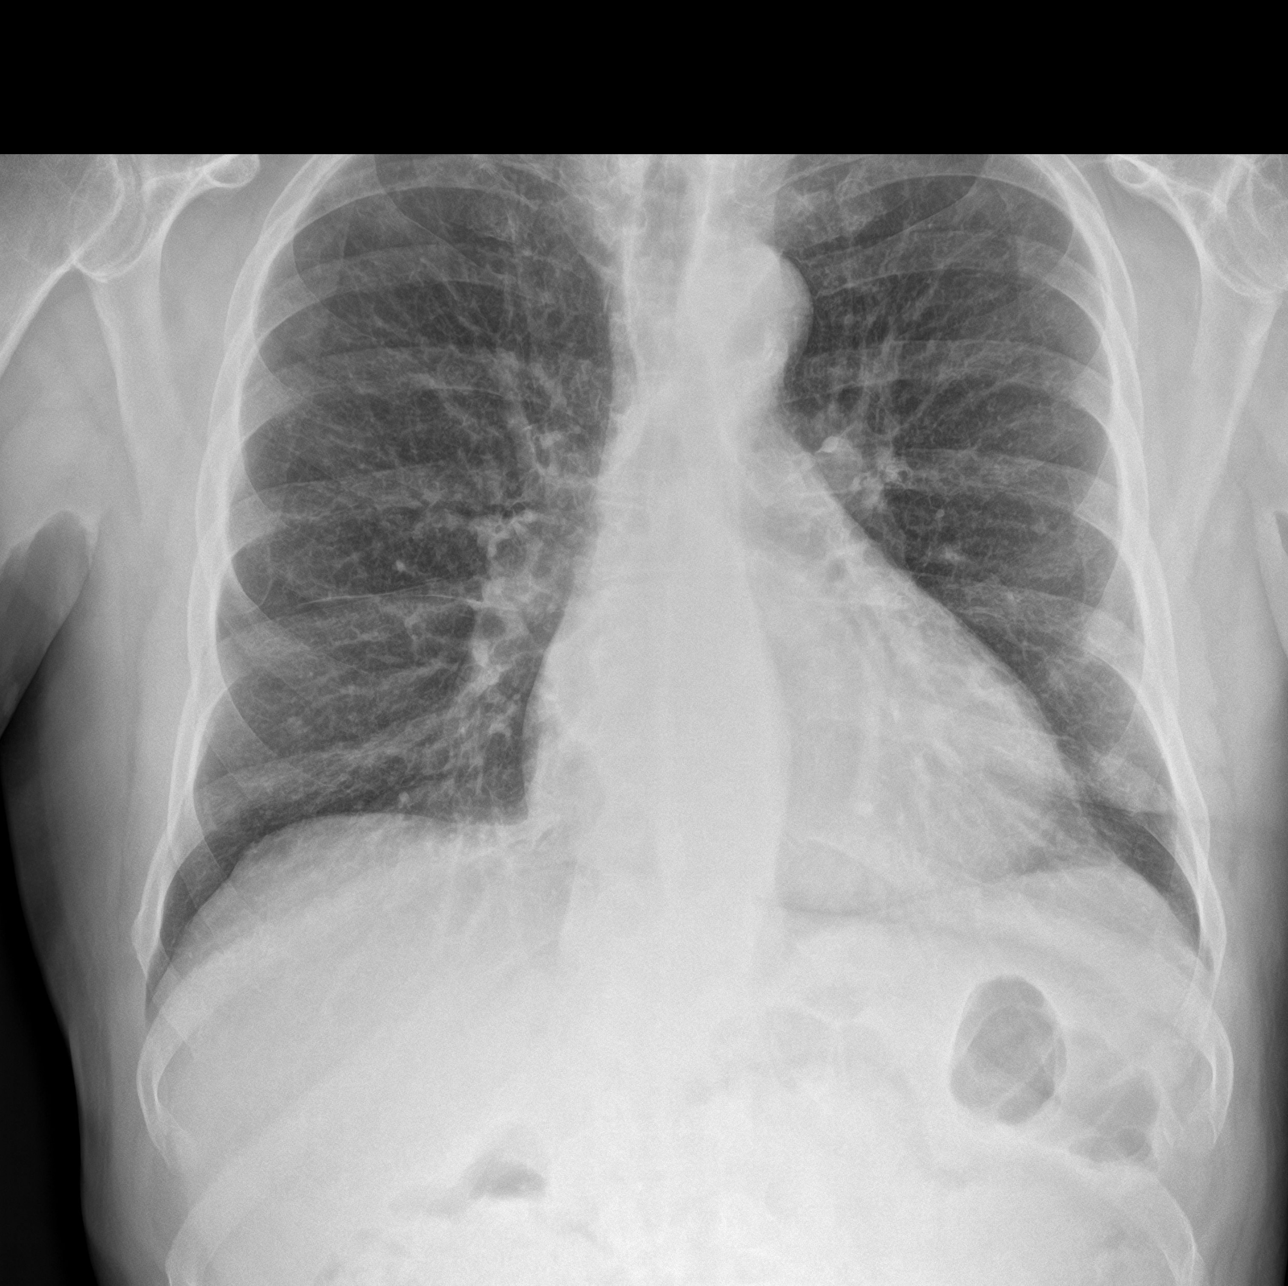

[rib pa]
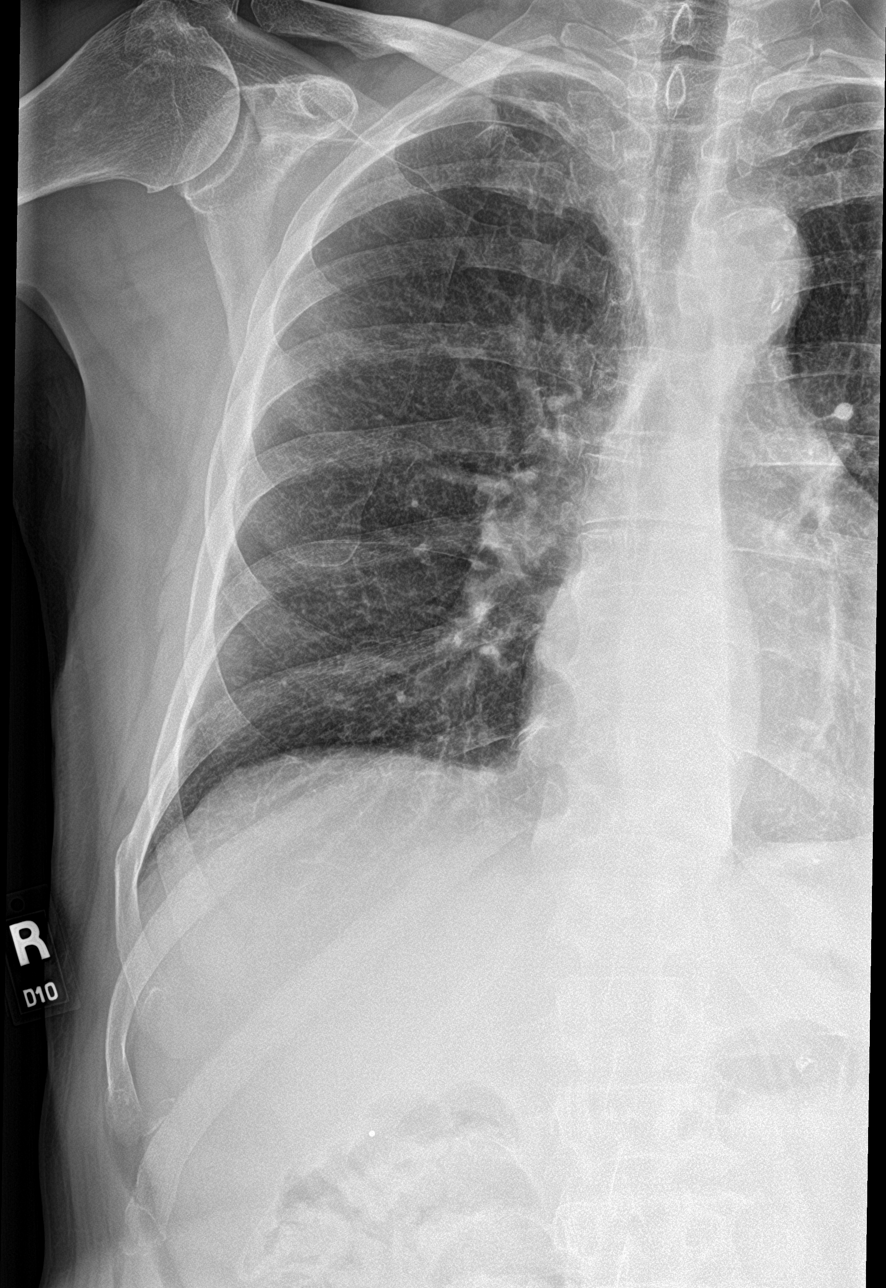

[rib pa obl (1 of 2)]
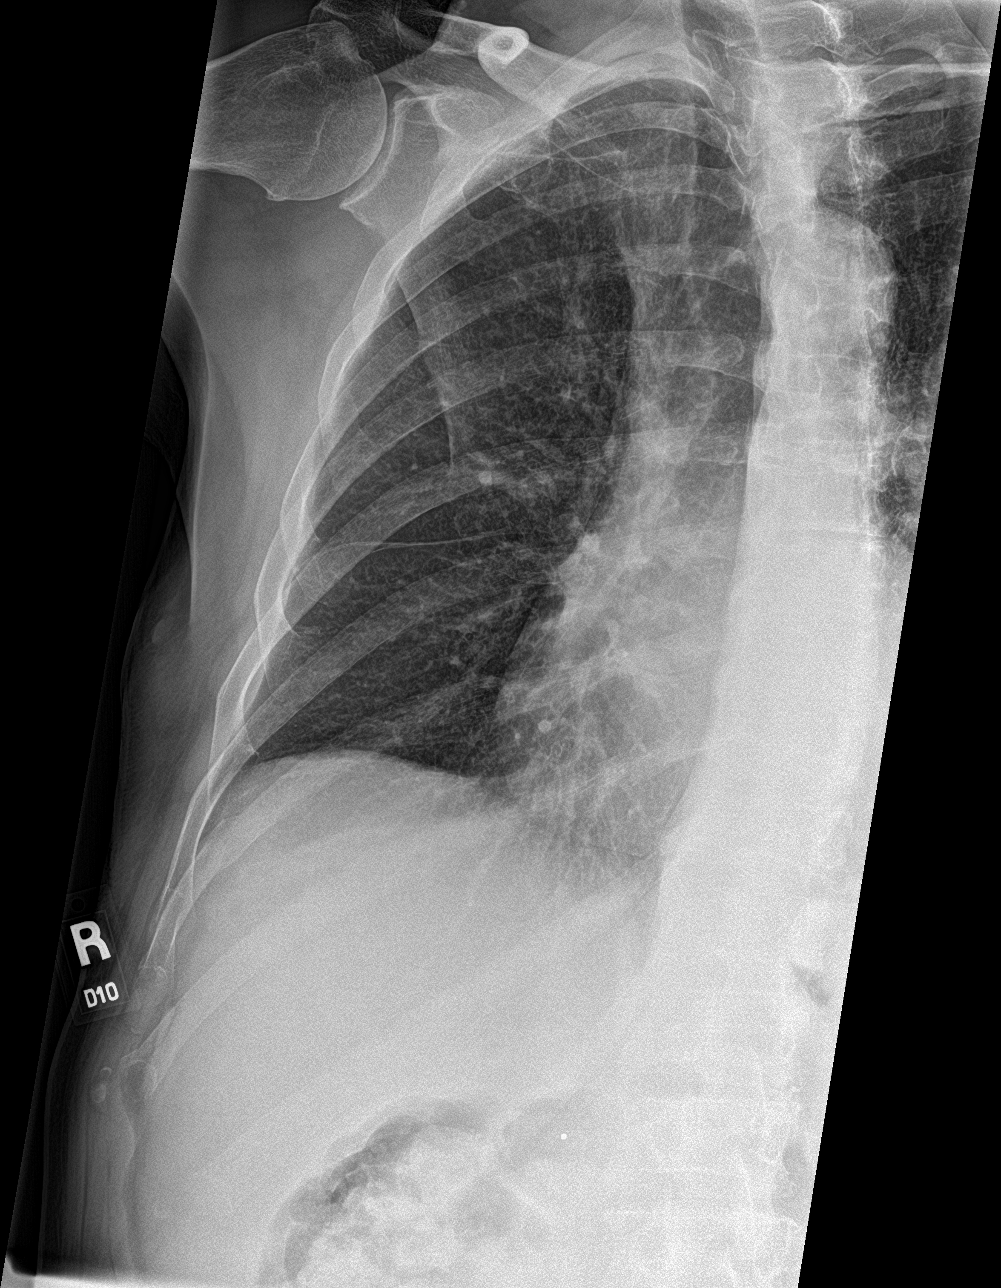

[rib pa obl (2 of 2)]
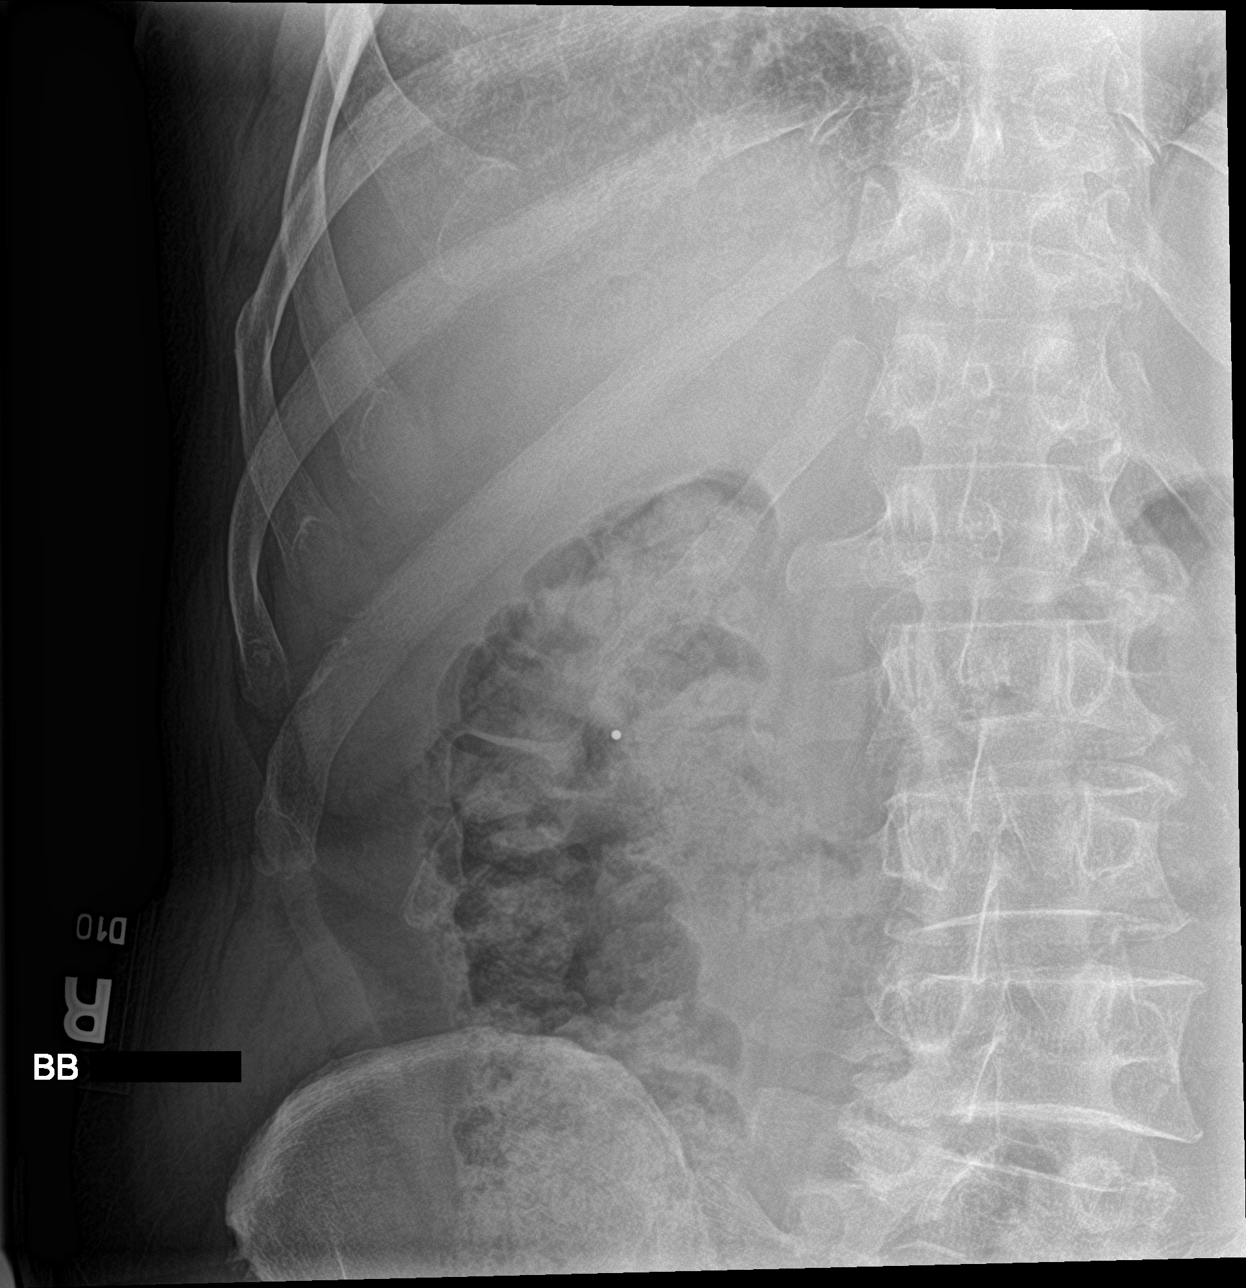

[4 of 4 positions shown; findings below may reference images not displayed]

FINDINGS: Cardiac shadow is within normal limits. Lungs are well aerated
bilaterally. No focal infiltrate or sizable effusion is seen. No
pneumothorax is noted. Old healed right ninth rib fracture is noted.
No acute rib fracture is seen.
IMPRESSION: Old healed right ninth rib fracture.  No acute fracture is noted.

## 2020-06-27 MED ORDER — LIDOCAINE 5 % EX PTCH
1.0000 | MEDICATED_PATCH | CUTANEOUS | Status: DC
  Administered 2020-06-27: 1 via TRANSDERMAL
  Filled 2020-06-27: qty 1

## 2020-06-27 MED ORDER — IOHEXOL 300 MG/ML  SOLN
100.0000 mL | Freq: Once | INTRAMUSCULAR | Status: AC | PRN
  Administered 2020-06-27: 100 mL via INTRAVENOUS

## 2020-06-27 MED ORDER — FENTANYL CITRATE (PF) 100 MCG/2ML IJ SOLN
50.0000 ug | Freq: Once | INTRAMUSCULAR | Status: AC
  Administered 2020-06-27: 50 ug via INTRAVENOUS
  Filled 2020-06-27: qty 2

## 2020-06-27 MED ORDER — LIDOCAINE 5 % EX PTCH: 1.0000 | MEDICATED_PATCH | Freq: Every day | CUTANEOUS | 0 refills | Status: DC | PRN

## 2020-06-27 MED ORDER — HYDROCODONE-ACETAMINOPHEN 5-325 MG PO TABS: 1.0000 | ORAL_TABLET | Freq: Four times a day (QID) | ORAL | 0 refills | Status: DC | PRN

## 2020-06-27 NOTE — Discharge Instructions (Addendum)
You were seen in the emergency department today following a fall.  Your imaging showed that you have a fracture of your right 12th rib which we suspect is causing the majority of your back pain.  We are sending you home with Lidoderm patches, please apply 1 patch to your area of most significant pain once per day, remove patch within 12 hours.  We are also sending home with Norco.  -Norco-this is a narcotic/controlled substance medication that has potential addicting qualities.  We recommend that you take 1 tablet every 6 hours as needed for severe pain.  Do not drive or operate heavy machinery when taking this medicine as it can be sedating. Do not drink alcohol or take other sedating medications when taking this medicine for safety reasons.  Keep this out of reach of small children.  Please be aware this medicine has Tylenol in it (325 mg/tab) do not exceed the maximum dose of Tylenol in a day per over the counter recommendations should you decide to supplement with Tylenol over the counter.   We have prescribed you new medication(s) today. Discuss the medications prescribed today with your pharmacist as they can have adverse effects and interactions with your other medicines including over the counter and prescribed medications. Seek medical evaluation if you start to experience new or abnormal symptoms after taking one of these medicines, seek care immediately if you start to experience difficulty breathing, feeling of your throat closing, facial swelling, or rash as these could be indications of a more serious allergic reaction  We are also sending you home with an incentive spirometer, please use this 5 times per day to help in preventing pneumonia.  We would like you to follow-up with your primary care provider within 3 days.  Return to the ER for new or worsening symptoms including but not limited to increased pain, trouble breathing, coughing up blood, fever, coughing up mucus, or any other  concerns.

## 2020-06-27 NOTE — ED Triage Notes (Signed)
Fell on deck at home today, pain in right flank area , and left shoulder, states he fell on laptop causing injury to back. States he was carrying a back pack with laptop inside when he fell

## 2020-06-27 NOTE — ED Provider Notes (Signed)
[facility]Y [facility]PART[facility] Provider Note   CSN: [id number] Arrival date & time: 06/27/20  1325     History Chief Complaint  Patient presents with  . Fall  . Back Pain  . Shoulder Injury    Isaac LaudJoseph T Nazar is a 74 y.o. male with a history of atrial fibrillation on Eliquis, hypertension, GERD, & AAA who presents to the ED S/p mechanical fall shortly PTA with complaints of back pain. Patient was walking on his deck with a backpack on when he slipped and fell backwards. He states a lap top in his backpack dug into his R flank area causing significant pain. He denies head injury or LOC. He has been ambulatory since the fall. He is having pain to the R flank as well as to the L shoulder some. Worse with movement, no alleviating factors. No intervention PTA. Denies headache, neck pain, numbness, weakness, incontinence, dyspnea, hemoptysis, hematuria, or abdominal pain.    HPI     Past Medical History:  Diagnosis Date  . AAA (abdominal aortic aneurysm) (HCC)   . Atrial fibrillation, permanent (HCC)   . Carotid artery calcification, bilateral   . Essential hypertension   . Gastroesophageal reflux disease     Patient Active Problem List   Diagnosis Date Noted  . Carpal tunnel syndrome of left wrist 06/16/2018  . Right hip pain 01/29/2017  . Left hip pain 01/29/2017  . Abnormal EKG 01/28/2012  . Hypertension, benign 01/28/2012  . Right carotid bruit 01/28/2012    History reviewed. No pertinent surgical history.     Family History  Problem Relation Age of Onset  . Coronary artery disease Mother [id number]       myocardial infarction  . Cancer - Lung Father   . Heart failure Sister   . Pulmonary fibrosis Brother   . Lymphoma Brother [id number]    Social History   Tobacco Use  . Smoking status: Former Games developermoker  . Smokeless tobacco: Never Used  Vaping Use  . Vaping Use: Never used  Substance Use Topics  . Alcohol use: Never  . Drug use: Never    Home Medications Prior to  Admission medications   Medication Sig Start Date End Date Taking? Authorizing Provider  clobetasol ointment (TEMOVATE) 0.05 %  02/22/20   [provider]  ELIQUIS 5 MG TABS tablet TAKE 1 TABLET BY MOUTH TWICE A DAY 01/11/20   Tonny Bollmanooper, Michael, MD  hydrochlorothiazide (HYDRODIURIL) 25 MG tablet TAKE 1 TABLET BY MOUTH EVERY DAY 09/21/19   Tonny Bollmanooper, Michael, MD  losartan (COZAAR) 100 MG tablet Take 1 tablet (100 mg total) by mouth daily. 09/15/19 09/09/20  Tonny Bollmanooper, Michael, MD  omeprazole (PRILOSEC) 40 MG capsule Take 40 mg by mouth daily. 10/02/19   [provider]  rosuvastatin (CRESTOR) 20 MG tablet TAKE 1 TABLET BY MOUTH EVERY DAY 12/16/19   Tonny Bollmanooper, Michael, MD    Allergies    Sulfamethoxazole, Sulfasalazine, and Sulfonamide derivatives  Review of Systems   Review of Systems  Constitutional: Negative for chills and fever.  Respiratory: Negative for cough and shortness of breath.   Gastrointestinal: Negative for abdominal pain, nausea and vomiting.  Genitourinary: Positive for flank pain. Negative for hematuria.  Musculoskeletal: Positive for arthralgias and back pain.  Neurological: Negative for seizures, syncope, weakness, numbness and headaches.       Negative for incontinence or saddle anesthesia.  All other systems reviewed and are negative.   Physical Exam Updated Vital Signs BP (!) 156/87 (BP Location: Right Arm)  Pulse (!) 46   Temp 98.1 F (36.7 C) (Oral)   Resp 14   Ht 6' (1.829 m)   Wt 88.9 kg   SpO2 97%   BMI 26.58 kg/m   Physical Exam Vitals and nursing note reviewed.  Constitutional:      General: He is not in acute distress.    Appearance: He is not ill-appearing or toxic-appearing.  HENT:     Head: Normocephalic and atraumatic.     Comments: No raccoon eyes or battle sign.     Ears:     Comments: No hemotympanum.  Eyes:     Extraocular Movements: Extraocular movements intact.     Pupils: Pupils are equal, round, and reactive to light.    Cardiovascular:     Rate and Rhythm: Regular rhythm. Bradycardia present.     Comments: 2+ symmetric radial pulses.  Pulmonary:     Effort: Pulmonary effort is normal.     Breath sounds: Normal breath sounds.  Chest:     Chest wall: Tenderness (right posterior lower chest wall. no overlying ecchymosis/abrasions) present.  Abdominal:     Palpations: Abdomen is soft.     Tenderness: There is no abdominal tenderness. There is right CVA tenderness. There is no left CVA tenderness, guarding or rebound.  Musculoskeletal:     Comments: UEs: Intact AROM. Some tenderness to the L glenohumeral joint, otherwise no focal bony tenderness.  Back: no midline tenderness or palpable step off. R lower thoracic/upper lumbar paraspinal muscle tenderness no overlying ecchymosis/abrasions.   Skin:    General: Skin is warm and dry.  Neurological:     Mental Status: He is alert.     Comments: Alert.  Clear speech.  CN III through XII grossly intact. Sensation grossly intact x 4. 5/5 symmetric grip strength & strength with plantar/dorsiflexion bilaterally. Patient is ambulatory.      ED Results / Procedures / Treatments   Labs (all labs ordered are listed, but only abnormal results are displayed) Labs Reviewed  COMPREHENSIVE METABOLIC PANEL - Abnormal; Notable for the following components:      Result Value   Sodium 134 (*)    Calcium 8.7 (*)    All other components within normal limits  CBC    EKG None  Radiology DG Ribs Unilateral W/Chest Right  Result Date: 06/27/2020 CLINICAL DATA:  Recent fall with right-sided chest pain, initial encounter EXAM: RIGHT RIBS AND CHEST - 3+ VIEW COMPARISON:  None. FINDINGS: Cardiac shadow is within normal limits. Lungs are well aerated bilaterally. No focal infiltrate or sizable effusion is seen. No pneumothorax is noted. Old healed right ninth rib fracture is noted. No acute rib fracture is seen. IMPRESSION: Old healed right ninth rib fracture.  No acute fracture  is noted. Electronically Signed   By: Alcide Clever M.D.   On: 06/27/2020 16:11   CT Abdomen Pelvis W Contrast  Result Date: 06/27/2020 CLINICAL DATA:  Fall from deck at home today now with pain and right flank region. EXAM: CT ABDOMEN AND PELVIS WITH CONTRAST TECHNIQUE: Multidetector CT imaging of the abdomen and pelvis was performed using the standard protocol following bolus administration of intravenous contrast. CONTRAST:  OMNIPAQUE IOHEXOL 300 MG/ML  SOLN COMPARISON:  03/11/2017 FINDINGS: Lower chest: 5 mm subpleural nodule within the lateral left lower lobe is unchanged, image number 1/4. No acute abnormality noted within the imaged portions of the lungs. Moderate changes of emphysema. Hepatobiliary: No hepatic injury or perihepatic hematoma. Gallbladder is unremarkable Pancreas:  Unremarkable. No pancreatic ductal dilatation or surrounding inflammatory changes. Spleen: Normal in size without focal abnormality. Adrenals/Urinary Tract: No adrenal hemorrhage or renal injury identified. Bladder is unremarkable. Stomach/Bowel: The stomach appears within normal limits. There is no bowel wall thickening, inflammation or distension identified. The appendix is visualized and appears normal. 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 Vascular/Lymphatic: Aortic atherosclerosis. Infrarenal abdominal aortic ectasia measures 2.9 cm, image 44/2. No abdominal or pelvic adenopathy identified. Reproductive: Prostate is unremarkable. Other: No free fluid or fluid collections within the abdomen or pelvis. Musculoskeletal: Mild curvature of the lumbar spine is convex towards the left. Multilevel degenerative disc disease noted. Nondisplaced fracture involving the distal aspect of the right twelfth rib is noted along the posterior right flank, image 34/2. IMPRESSION: 1. Nondisplaced fracture involving the distal aspect of the right twelfth rib is noted along the posterior right flank. 2. No  signs of solid organ injury. 3. Emphysema and aortic atherosclerosis. Aortic Atherosclerosis (ICD10-I70.0) and Emphysema (ICD10-J43.9). Electronically Signed   By: Signa Kell M.D.   On: 06/27/2020 18:00   DG Shoulder Left  Result Date: 06/27/2020 CLINICAL DATA:  Recent fall with left shoulder pain, initial encounter EXAM: LEFT SHOULDER - 2+ VIEW COMPARISON:  None. FINDINGS: Degenerative changes of the acromioclavicular joint are noted. No acute fracture or dislocation is seen. No new lying bony thorax appears within normal limits. No soft tissue abnormality is noted. IMPRESSION: Degenerative change without acute abnormality. Electronically Signed   By: Alcide Clever M.D.   On: 06/27/2020 16:15    Procedures Procedures (including critical care time)  Medications Ordered in ED Medications  fentaNYL (SUBLIMAZE) injection 50 mcg (50 mcg Intravenous Given 06/27/20 1553)  iohexol (OMNIPAQUE) 300 MG/ML solution 100 mL (100 mLs Intravenous Contrast Given 06/27/20 1729)    ED Course  I have reviewed the triage vital signs and the nursing notes.  Pertinent labs & imaging results that were available during my care of the patient were reviewed by me and considered in my medical decision making (see chart for details).    MDM Rules/Calculators/A&P                          Patient presents to the ED with complaints of back & shoulder pain S/p mechanical fall shortly PTA. Mildly bradycardic w/ hypertension- similar vitals on chart review. Tenderness to R flank/ lower thoracic paraspinal muscles/ribs as well as to L glenohuemral joint.   Additional history obtained:  Additional history obtained from chart review & nursing note review.   Lab Tests:  I Ordered, reviewed, and interpreted labs, which included:  CBC: No anemia/leukocytosis.  CMP: Mild hypocalcemia/hyponatremia- no significant electrolyte derangement. Renal function & LFTs WNL.  Imaging Studies ordered:  I ordered imaging studies which  included left shoulder x-ray, right rib/chest x-ray, and CT A/p, I independently visualized and interpreted imaging which showed a Nondisplaced fracture involving the distal aspect of the right twelfth rib is noted along the posterior right flank. No signs of solid organ injury. No fracture to L shoulder.   No signs of serious head, neck, or back injury, no midline spinal tenderness or focal neurologic deficits. No tenderness to the upper chest or overlying skin changes to raise concern for upper thoracic injury. Visualized lower thoracic area with CT A/P which reveals 12th rib fracture- no further solid organ injury. Will provide incentive spirometer in the ED and discharge home with lidoderm patches & short course of norco.  I discussed results, treatment plan, need for follow-up, and return precautions with the patient & his wife. Provided opportunity for questions, patient & is wife confirmed understanding and are in agreement with plan.   Findings and plan of care discussed with supervising physician Dr. Juleen China who is in agreement.   Portions of this note were generated with Scientist, clinical (histocompatibility and immunogenetics). Dictation errors may occur despite best attempts at proofreading.  Final Clinical Impression(s) / ED Diagnoses Final diagnoses:  Fall, initial encounter  Closed fracture of one rib of right side, initial encounter    Rx / DC Orders ED Discharge Orders         Ordered    HYDROcodone-acetaminophen (NORCO/VICODIN) 5-325 MG tablet  Every 6 hours PRN     Discontinue  Reprint     06/27/20 1831    lidocaine (LIDODERM) 5 %  Daily PRN     Discontinue  Reprint     06/27/20 1831           Lavada Langsam, Pleas Koch, PA-C 06/27/20 1833    Raeford Razor, MD 07/02/20 2046

## 2020-07-06 ENCOUNTER — Other Ambulatory Visit: Payer: Self-pay | Admitting: Cardiovascular Disease

## 2020-07-06 DIAGNOSIS — I4891 Unspecified atrial fibrillation: Secondary | ICD-10-CM

## 2020-07-06 NOTE — Telephone Encounter (Signed)
Eliquis 5mg  refill request received. Patient is 74 years old, weight-88.9kg, Crea-0.91 on 06/27/2020, Diagnosis-Afib, and last seen by Dr. 06/29/2020 on 02/25/2020. Dose is appropriate based on dosing criteria. Will send in refill to requested pharmacy.

## 2020-07-09 ENCOUNTER — Ambulatory Visit (INDEPENDENT_AMBULATORY_CARE_PROVIDER_SITE_OTHER): Payer: Medicare Other

## 2020-07-09 DIAGNOSIS — I4819 Other persistent atrial fibrillation: Secondary | ICD-10-CM

## 2020-07-21 ENCOUNTER — Telehealth: Payer: Self-pay | Admitting: Cardiovascular Disease

## 2020-07-21 NOTE — Telephone Encounter (Signed)
Pt found to be in AF with 37bpm sustained at (found on strip 3 on page 8. 100% AF for duration of 2 days and 22hrs. Pt did not trigger any events. Monitor strips are available to download per [employer].

## 2020-07-21 NOTE — Telephone Encounter (Signed)
Joselyn with [employer] is calling to report abnormal [employer] monitor results. Joselyn's call back number is [phone] reference #: [id number].

## 2020-07-26 ENCOUNTER — Other Ambulatory Visit: Payer: Self-pay | Admitting: Family Medicine

## 2020-07-26 DIAGNOSIS — R911 Solitary pulmonary nodule: Secondary | ICD-10-CM

## 2020-08-08 ENCOUNTER — Other Ambulatory Visit: Payer: Self-pay

## 2020-08-08 ENCOUNTER — Ambulatory Visit
Admission: RE | Admit: 2020-08-08 | Discharge: 2020-08-08 | Disposition: A | Discharge status: Home / Self Care | Payer: Medicare Other | Source: Ambulatory Visit | Attending: Family Medicine | Admitting: Family Medicine

## 2020-08-08 DIAGNOSIS — R911 Solitary pulmonary nodule: Secondary | ICD-10-CM

## 2020-08-08 IMAGING — CT CT CHEST W/O CM
1 series · 15 of 34 positions shown, 19 images · non-contrast
Comparison: [DATE], [DATE]

CLINICAL DATA: Follow-up pulmonary nodules, fall and broken rib
last month

EXAM:
CT CHEST WITHOUT CONTRAST
TECHNIQUE: Multidetector CT imaging of the chest was performed following the
standard protocol without IV contrast.

[Series 2: chest w/(date) · axial · 0.77mm/px · z∈[-361,-43]mm · 15 of 187 slices shown, 19 images]
[im 14/187  mediastinal]
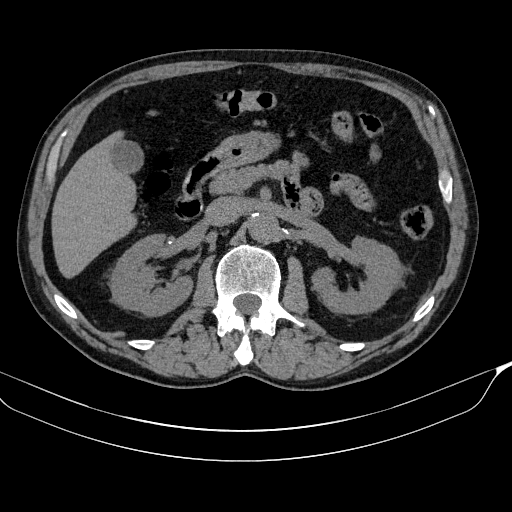
[im 14/187  lung]
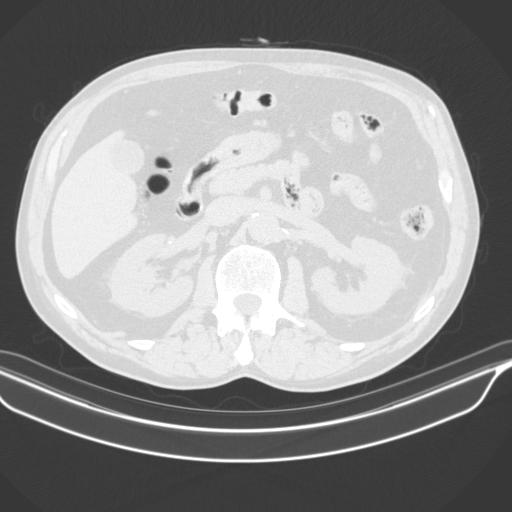
[im 28/187  lung]
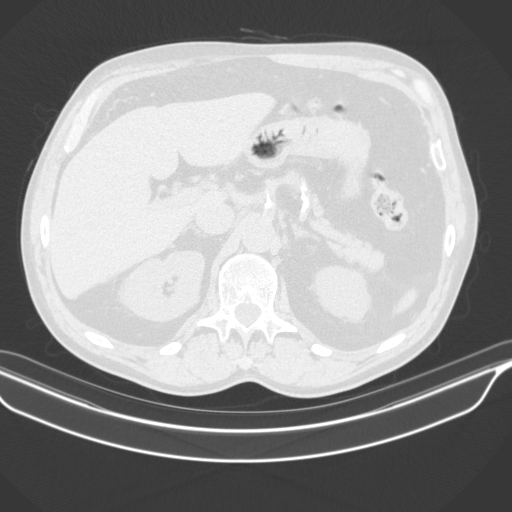
[im 38/187  lung]
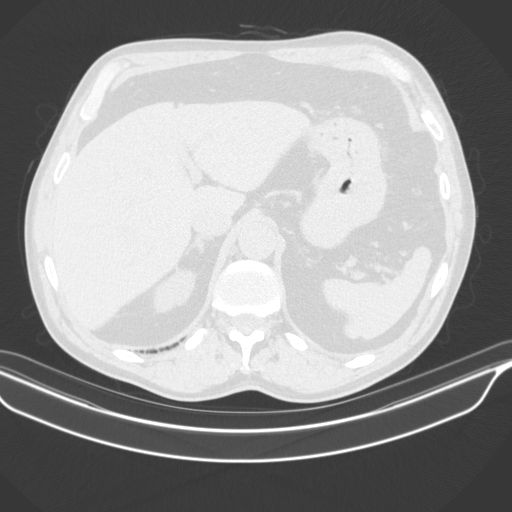
[im 49/187  lung]
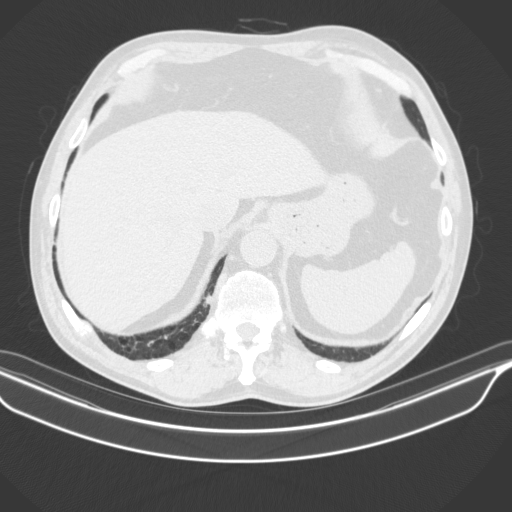
[im 63/187  mediastinal]
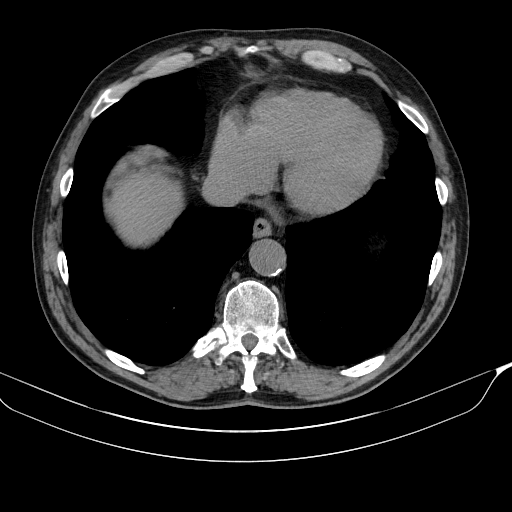
[im 63/187  lung]
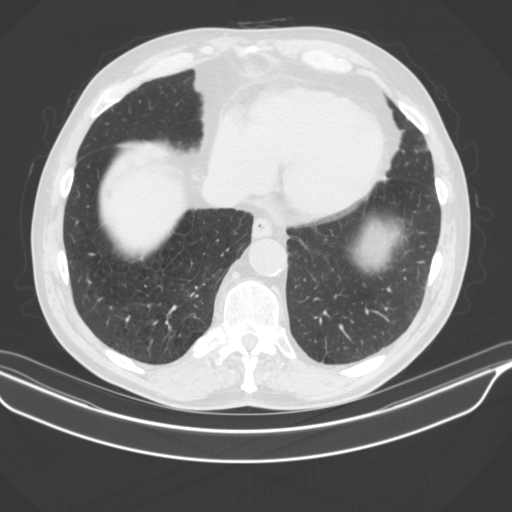
[im 75/187  lung]
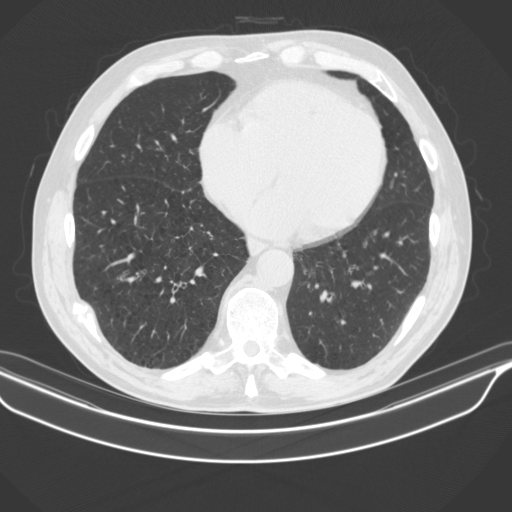
[im 83/187  lung]
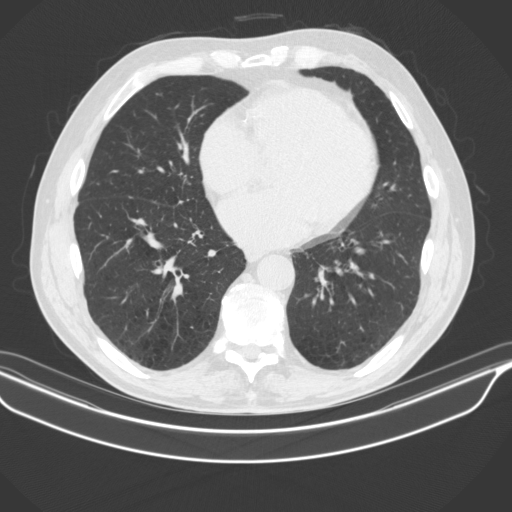
[im 97/187  lung]
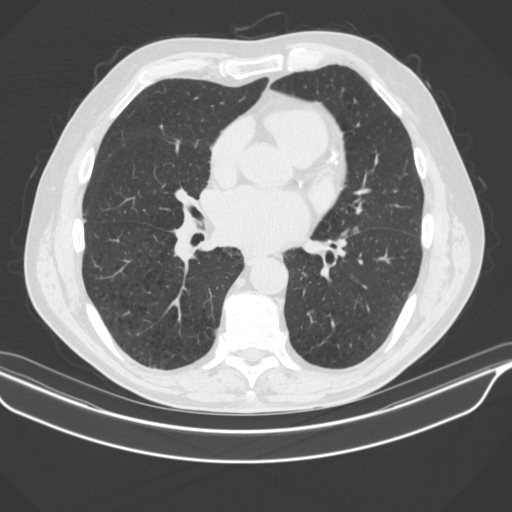
[im 104/187  mediastinal]
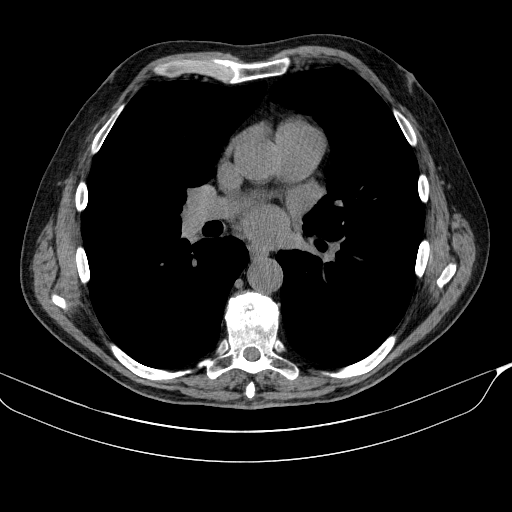
[im 104/187  lung]
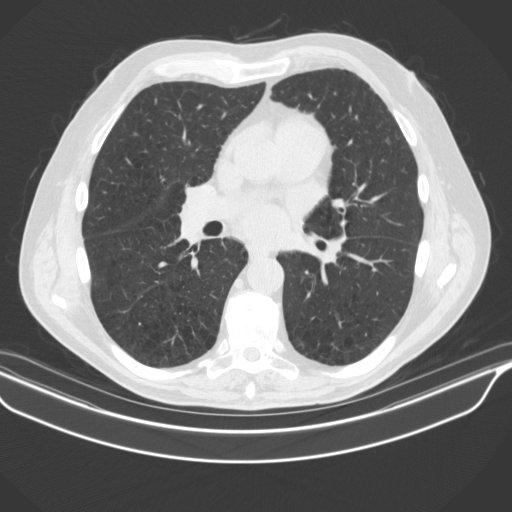
[im 112/187  lung]
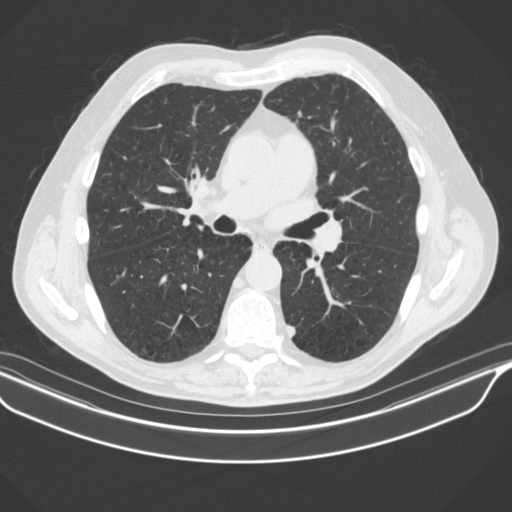
[im 125/187  lung]
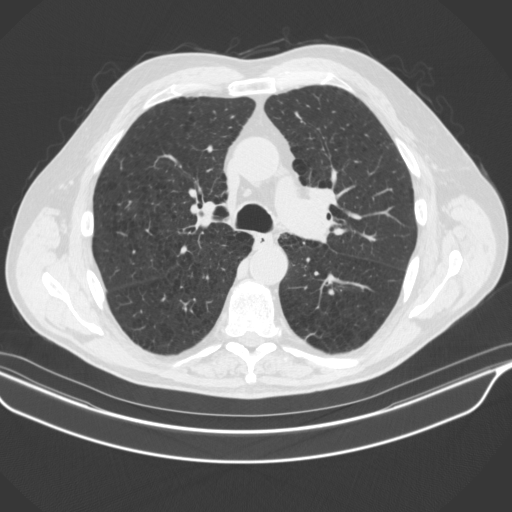
[im 138/187  lung]
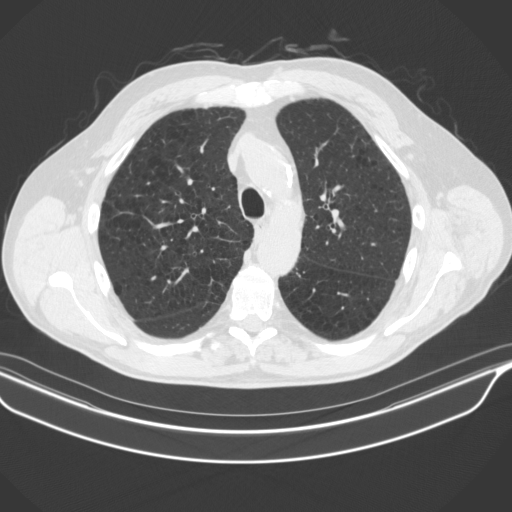
[im 149/187  mediastinal]
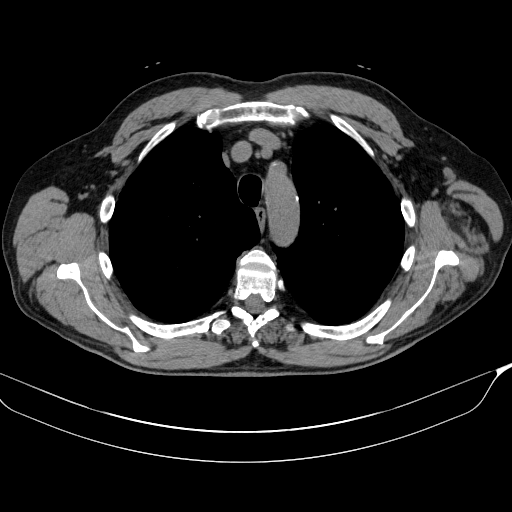
[im 149/187  lung]
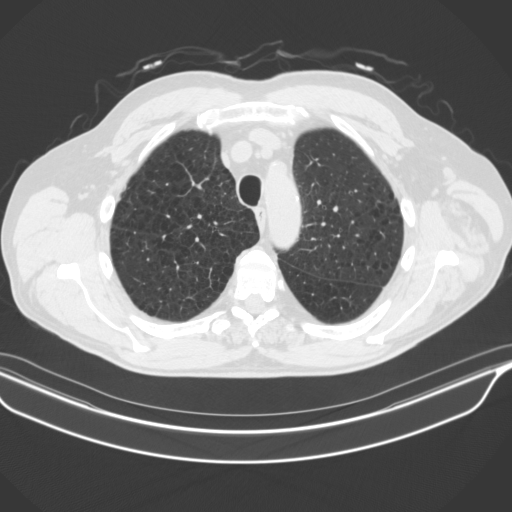
[im 159/187  lung]
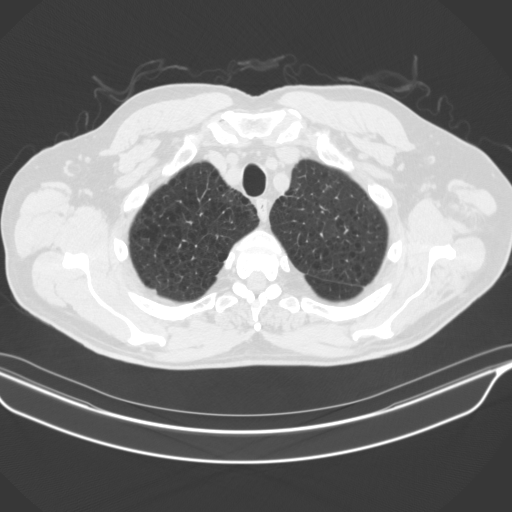
[im 173/187  lung]
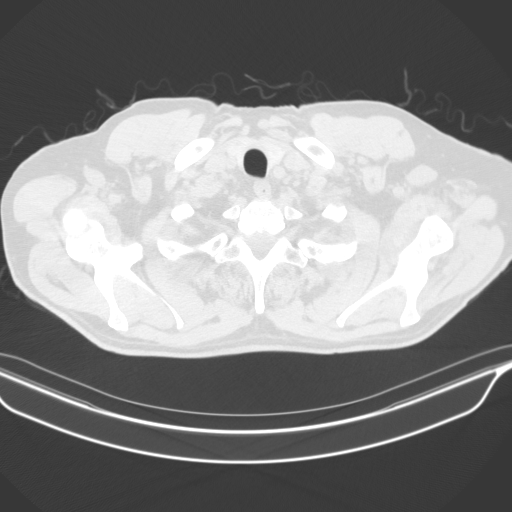

[15 of 34 positions shown; findings below may reference images not displayed]

FINDINGS: Cardiovascular: Aortic atherosclerosis. Normal heart size.
Three-vessel coronary artery calcifications. No pericardial
effusion.

Mediastinum/Nodes: No enlarged mediastinal, hilar, or axillary lymph
nodes. Thyroid gland, trachea, and esophagus demonstrate no
significant findings.

Lungs/Pleura: Moderate to severe centrilobular emphysema. Benign
calcified nodule of the central right upper lobe (series 5, image
73). Multiple bilateral pulmonary nodules are stable, the largest a
subpleural nodule of the medial left lower lobe measuring 1.0 cm
(series 5, image 76). No pleural effusion or pneumothorax.

Upper Abdomen: No acute abnormality.

Musculoskeletal: No chest wall mass or suspicious bone lesions
identified. Subacute fractures of the posterolateral right ninth,
tenth, eleventh, and twelfth ribs.
IMPRESSION: 1. Multiple bilateral pulmonary nodules measuring 1.0 cm or smaller
are stable since initial examination dated [DATE]. No new
nodules. Additional CT at 18-24 months (from baseline scan) is
considered optional for low-risk patients, but is recommended for
high-risk patients. This recommendation follows the consensus
statement: Guidelines for Management of Incidental Pulmonary Nodules
Detected on CT Images: From the [HOSPITAL] [[facility]]; Radiology
[[facility]]; [DATE].
2. Moderate to severe centrilobular emphysema. Emphysema
([KC]-[KC]).
3. Subacute fractures of the posterolateral right ninth, tenth,
eleventh, and twelfth ribs.
4. Coronary artery disease. Aortic Atherosclerosis ([KC]-[KC]).

## 2020-08-17 ENCOUNTER — Ambulatory Visit (INDEPENDENT_AMBULATORY_CARE_PROVIDER_SITE_OTHER): Payer: Medicare Other | Admitting: Cardiovascular Disease

## 2020-08-17 ENCOUNTER — Encounter: Payer: Self-pay | Admitting: Cardiovascular Disease

## 2020-08-17 ENCOUNTER — Other Ambulatory Visit: Payer: Self-pay

## 2020-08-17 VITALS — BP 130/80 | HR 50 | Ht 72.0 in | Wt 194.2 lb

## 2020-08-17 DIAGNOSIS — I4819 Other persistent atrial fibrillation: Secondary | ICD-10-CM | POA: Diagnosis not present

## 2020-08-17 DIAGNOSIS — I10 Essential (primary) hypertension: Secondary | ICD-10-CM

## 2020-08-17 DIAGNOSIS — I2510 Atherosclerotic heart disease of native coronary artery without angina pectoris: Secondary | ICD-10-CM | POA: Diagnosis not present

## 2020-08-17 DIAGNOSIS — E782 Mixed hyperlipidemia: Secondary | ICD-10-CM

## 2020-08-17 NOTE — Progress Notes (Signed)
Cardiology Office Note:    Date:  08/17/2020   ID:  Leroy Lara, DOB Jun 02, 1946, MRN [id number]  PCP:  Richmond Campbell., PA-C  [facility] Cardiologist:  [name], MD  [facility] Electrophysiologist:  None   Referring MD: Richmond Campbell., PA-C   Chief Complaint  Patient presents with  . Atrial Fibrillation    History of Present Illness:    Leroy Lara is a 74 y.o. male with a hx of permanent atrial fibrillation with slow ventricular rate, hypertension, abdominal aortic aneurysm, and mild carotid stenosis.  The patient had a coronary CTA performed last year demonstrating nonobstructive plaquing without high-grade coronary stenoses.  An echocardiogram has demonstrated normal LV systolic function with no significant valvular heart disease.  The patient is here alone today.  He is doing well from a cardiac perspective.  He denies chest pain, chest pressure, or heart palpitations.  He has mild to moderate exertional dyspnea but no resting symptoms.  No orthopnea, PND, or leg swelling.  Past Medical History:  Diagnosis Date  . AAA (abdominal aortic aneurysm) (HCC)   . Atrial fibrillation, permanent (HCC)   . Carotid artery calcification, bilateral   . Essential hypertension   . Gastroesophageal reflux disease     History reviewed. No pertinent surgical history.  Current Medications: Current Meds  Medication Sig  . clobetasol ointment (TEMOVATE) 0.05 %   . ELIQUIS 5 MG TABS tablet TAKE 1 TABLET BY MOUTH TWICE A DAY  . hydrochlorothiazide (HYDRODIURIL) 25 MG tablet TAKE 1 TABLET BY MOUTH EVERY DAY  . losartan (COZAAR) 100 MG tablet Take 1 tablet (100 mg total) by mouth daily.  Marland Kitchen omeprazole (PRILOSEC) 40 MG capsule Take 40 mg by mouth daily.  . rosuvastatin (CRESTOR) 20 MG tablet TAKE 1 TABLET BY MOUTH EVERY DAY     Allergies:   Sulfamethoxazole, Sulfasalazine, and Sulfonamide derivatives   Social History   Socioeconomic History  . Marital status:  Divorced    Spouse name: Not on file  . Number of children: Not on file  . Years of education: Not on file  . Highest education level: Not on file  Occupational History  . Not on file  Tobacco Use  . Smoking status: Former Games developer  . Smokeless tobacco: Never Used  Vaping Use  . Vaping Use: Never used  Substance and Sexual Activity  . Alcohol use: Never  . Drug use: Never  . Sexual activity: Not on file  Other Topics Concern  . Not on file  Social History Narrative  . Not on file   Social Determinants of Health   Financial Resource Strain:   . Difficulty of Paying Living Expenses: Not on file  Food Insecurity:   . Worried About Programme researcher, broadcasting/film/video in the Last Year: Not on file  . Ran Out of Food in the Last Year: Not on file  Transportation Needs:   . Lack of Transportation (Medical): Not on file  . Lack of Transportation (Non-Medical): Not on file  Physical Activity:   . Days of Exercise per Week: Not on file  . Minutes of Exercise per Session: Not on file  Stress:   . Feeling of Stress : Not on file  Social Connections:   . Frequency of Communication with Friends and Family: Not on file  . Frequency of Social Gatherings with Friends and Family: Not on file  . Attends Religious Services: Not on file  . Active Member of Clubs or Organizations: Not  on file  . Attends Banker Meetings: Not on file  . Marital Status: Not on file     Family History: The patient's family history includes Cancer - Lung in his father; Coronary artery disease (age of onset: 42) in his mother; Heart failure in his sister; Lymphoma (age of onset: 49) in his brother; Pulmonary fibrosis in his brother.  ROS:   Please see the history of present illness.    All other systems reviewed and are negative.  EKGs/Labs/Other Studies Reviewed:    The following studies were reviewed today: Long Term Monitor: Study Highlights  The basic rhythm is atrial fibrillation with an average HR of  55 bpm There are periods of slow atrial fibrillation with competing junctional pacemaker heart rate in the 35-40 bpm range, occurring during normal sleep hours No pathologic pauses > 3 seconds No significant tachyarrhythmias  Echo 01/06/2020: IMPRESSIONS    1. Left ventricular ejection fraction, by visual estimation, is 60 to  65%. The left ventricle has normal function. There is no left ventricular  hypertrophy.  2. Left ventricular diastolic parameters are indeterminate.  3. The left ventricle has no regional wall motion abnormalities.  4. Global right ventricle has normal systolic function.The right  ventricular size is normal. No increase in right ventricular wall  thickness.  5. Left atrial size was moderately dilated.  6. Right atrial size was mildly dilated.  7. The mitral valve is normal in structure. Trivial mitral valve  regurgitation. No evidence of mitral stenosis.  8. The tricuspid valve is normal in structure.  9. The tricuspid valve is normal in structure. Tricuspid valve  regurgitation is not demonstrated.  10. The aortic valve is tricuspid. Aortic valve regurgitation is trivial.  Mild aortic valve sclerosis without stenosis.  11. The pulmonic valve was normal in structure. Pulmonic valve  regurgitation is not visualized.  12. Normal pulmonary artery systolic pressure.  13. The inferior vena cava is normal in size with greater than 50%  respiratory variability, suggesting right atrial pressure of 3 mmHg.   CT Chest 08-08-2020: IMPRESSION: 1. Multiple bilateral pulmonary nodules measuring 1.0 cm or smaller are stable since initial examination dated 09/23/2019. No new nodules. Additional CT at 18-24 months (from baseline scan) is considered optional for low-risk patients, but is recommended for high-risk patients. This recommendation follows the consensus statement: Guidelines for Management of Incidental Pulmonary Nodules Detected on CT Images: From the  Fleischner Society 2017; Radiology 2017; 284:228-243. 2. Moderate to severe centrilobular emphysema. Emphysema (ICD10-J43.9). 3. Subacute fractures of the posterolateral right ninth, tenth, eleventh, and twelfth ribs. 4. Coronary artery disease. Aortic Atherosclerosis (ICD10-I70.0).  EKG:  EKG is ordered today.  The ekg ordered today demonstrates atrial fibrillation 50 bpm.  Recent Labs: 09/01/2019: NT-Pro BNP 967 06/27/2020: ALT 22; BUN 16; Creatinine, Ser 0.91; Hemoglobin 14.4; Platelets 249; Potassium 4.2; Sodium 134  Recent Lipid Panel    Component Value Date/Time   CHOL 131 09/01/2019 0750   TRIG 89 09/01/2019 0750   HDL 52 09/01/2019 0750   CHOLHDL 2.5 09/01/2019 0750   CHOLHDL 3.0 01/10/2016 0759   VLDL 19 01/10/2016 0759   LDLCALC 62 09/01/2019 0750   LDLDIRECT 154.8 08/26/2013 0739    Physical Exam:    VS:  BP 130/80   Pulse (!) 50   Ht 6' (1.829 m)   Wt 194 lb 3.2 oz (88.1 kg)   SpO2 95%   BMI 26.34 kg/m     Wt Readings from Last 3 Encounters:  08/17/20 194 lb 3.2 oz (88.1 kg)  06/27/20 196 lb (88.9 kg)  02/25/20 200 lb (90.7 kg)     GEN:  Well nourished, well developed in no acute distress HEENT: Normal NECK: No JVD; No carotid bruits LYMPHATICS: No lymphadenopathy CARDIAC: bradycardic and regular, no murmur RESPIRATORY:  Clear to auscultation without rales, wheezing or rhonchi  ABDOMEN: Soft, non-tender, non-distended MUSCULOSKELETAL:  No edema; No deformity  SKIN: Warm and dry NEUROLOGIC:  Alert and oriented x 3 PSYCHIATRIC:  Normal affect   ASSESSMENT:    1. Persistent atrial fibrillation (HCC)   2. Coronary artery disease involving native coronary artery of native heart without angina pectoris   3. Mixed hyperlipidemia   4. Essential hypertension    PLAN:    In order of problems listed above:  1. The patient's monitor is reviewed and demonstrates slow atrial fibrillation.  There are no pathologic pauses greater than 3 seconds.  Most of the  marked bradycardia is nocturnal.  He is not having any lightheadedness and has not had episodes of syncope.  Ongoing medical management is recommended.  He continues on anticoagulation with apixaban. 2. Nonobstructive CAD by coronary CTA last year.  Continue risk reduction measures with a statin drug.  He is not a candidate for beta-blocker because of bradycardia. 3. Lipids reviewed with an LDL cholesterol 62 mg/dL on rosuvastatin 20 mg daily 4. Blood pressure is well controlled on hydrochlorothiazide and losartan.  Most recent labs are reviewed with a creatinine of 0.91 and a potassium of 4.2.    Medication Adjustments/Labs and Tests Ordered: Current medicines are reviewed at length with the patient today.  Concerns regarding medicines are outlined above.  No orders of the defined types were placed in this encounter.  No orders of the defined types were placed in this encounter.   There are no Patient Instructions on file for this visit.   Signed, [name], MD  08/17/2020 5:28 PM    [facility]

## 2020-09-11 ENCOUNTER — Other Ambulatory Visit: Payer: Self-pay | Admitting: Cardiovascular Disease

## 2020-09-22 ENCOUNTER — Other Ambulatory Visit: Payer: Self-pay | Admitting: Cardiovascular Disease

## 2020-09-26 ENCOUNTER — Other Ambulatory Visit: Payer: Self-pay | Admitting: Family Medicine

## 2020-09-26 DIAGNOSIS — M19012 Primary osteoarthritis, left shoulder: Secondary | ICD-10-CM

## 2020-09-26 DIAGNOSIS — M25512 Pain in left shoulder: Secondary | ICD-10-CM

## 2020-10-17 ENCOUNTER — Other Ambulatory Visit: Payer: Self-pay

## 2020-10-17 ENCOUNTER — Ambulatory Visit
Admission: RE | Admit: 2020-10-17 | Discharge: 2020-10-17 | Disposition: A | Discharge status: Home / Self Care | Payer: Medicare Other | Source: Ambulatory Visit | Attending: Family Medicine | Admitting: Family Medicine

## 2020-10-17 DIAGNOSIS — M25512 Pain in left shoulder: Secondary | ICD-10-CM

## 2020-10-17 DIAGNOSIS — M19012 Primary osteoarthritis, left shoulder: Secondary | ICD-10-CM

## 2020-10-17 IMAGING — CR DG ORBITS FOR FOREIGN BODY
2 series · 2 of 2 positions shown · non-contrast
Comparison: none

CLINICAL DATA: History of metal removal from orbit

EXAM:
ORBITS FOR FOREIGN BODY - 2 VIEW

[w orbit pa (1 of 2)]
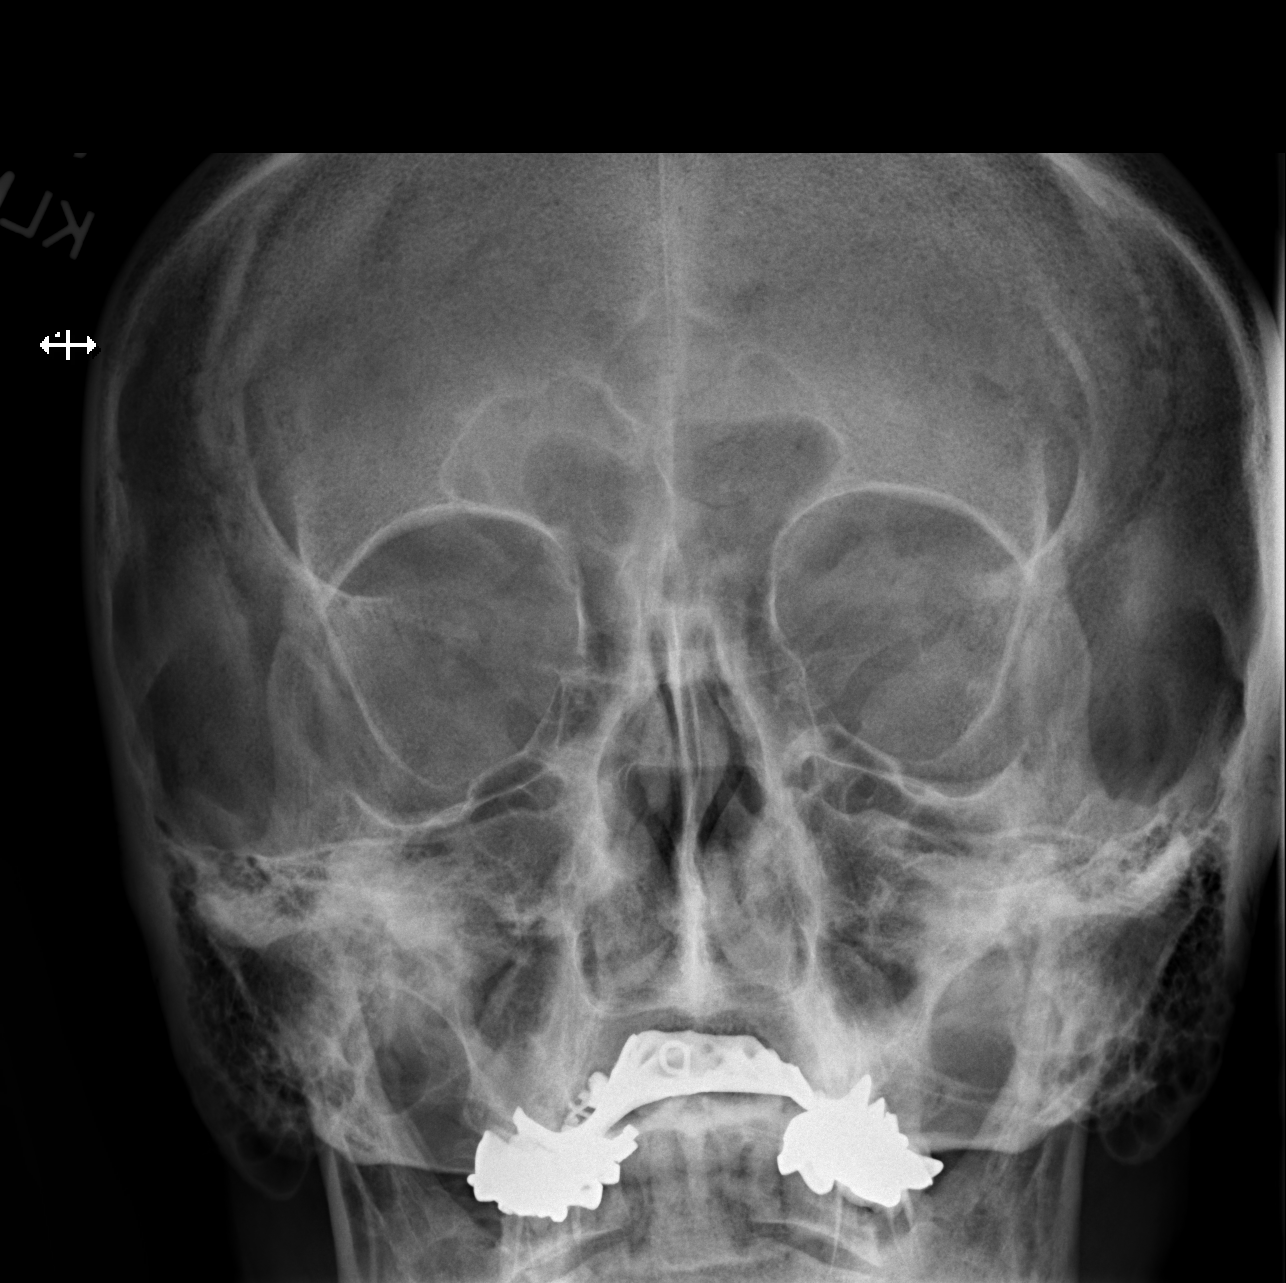

[w orbit pa (2 of 2)]
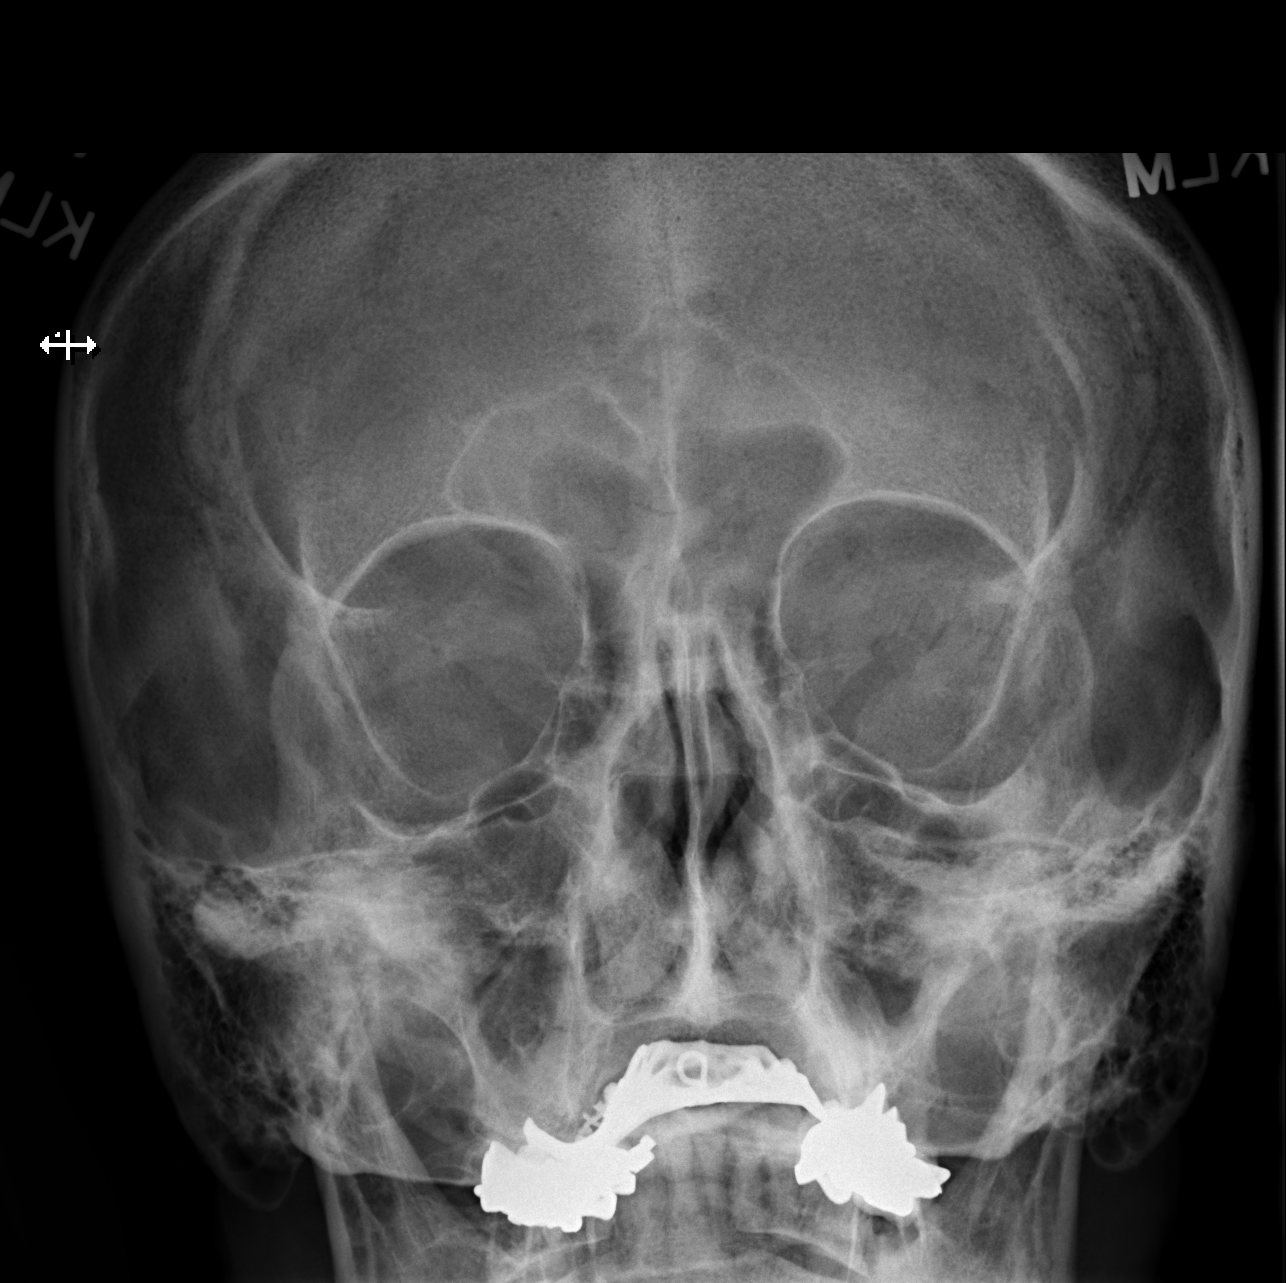

[2 of 2 positions shown; findings below may reference images not displayed]

FINDINGS: There is no evidence of metallic foreign body within the orbits. No
significant bone abnormality identified.
IMPRESSION: No evidence of metallic foreign body within the orbits.

## 2020-10-17 IMAGING — MR MR SHOULDER*L* W/O CM
4 of 5 series · 20 of 40 positions shown · non-contrast
Comparison: Plain films left shoulder [DATE].

CLINICAL DATA: Onset left shoulder pain and limited range of motion
[DATE] after a fall. Subsequent encounter.

EXAM:
MRI OF THE LEFT SHOULDER WITHOUT CONTRAST
TECHNIQUE: Multiplanar, multisequence MR imaging of the shoulder was performed.
No intravenous contrast was administered.

[Series 7: T2 fat-sat · axial · left · 3.0mm · 0.47mm/px · z∈[-42,+44]mm · 7 of 27 slices shown (1 of 3)]
[im 1/27]
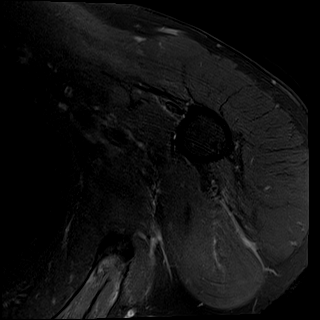
[im 4/27]
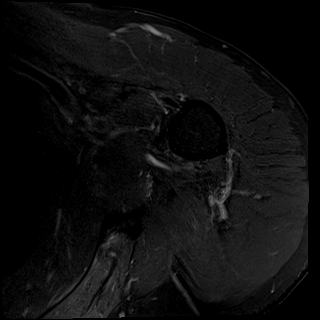
[im 8/27]
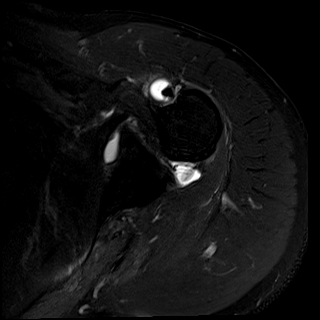
[im 12/27]
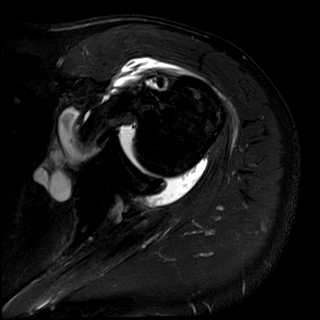
[im 15/27]
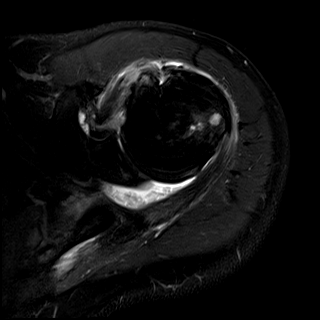
[im 19/27]
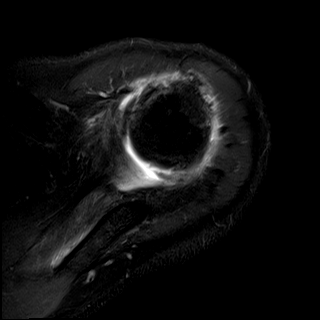
[im 23/27]
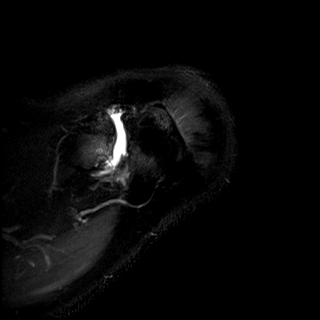

[Series 8: T2 fat-sat · oblique · left · 4.0mm · 0.22mm/px · 3 of 21 slices shown (2 of 3)]
[im 4/21]
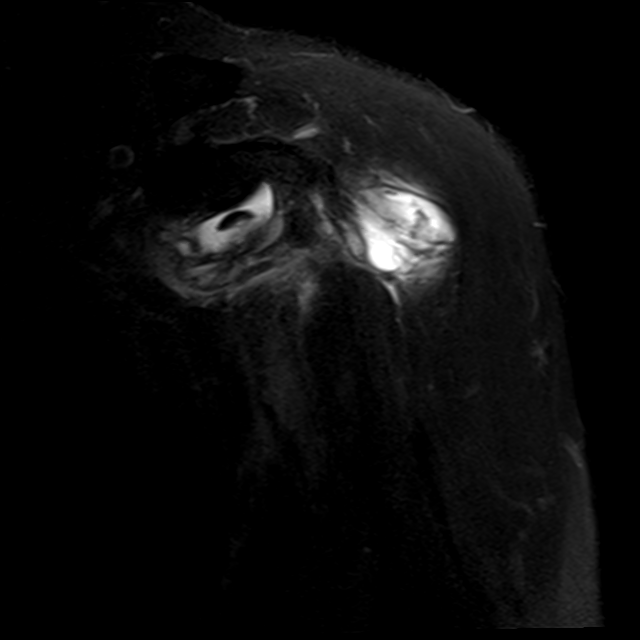
[im 11/21]
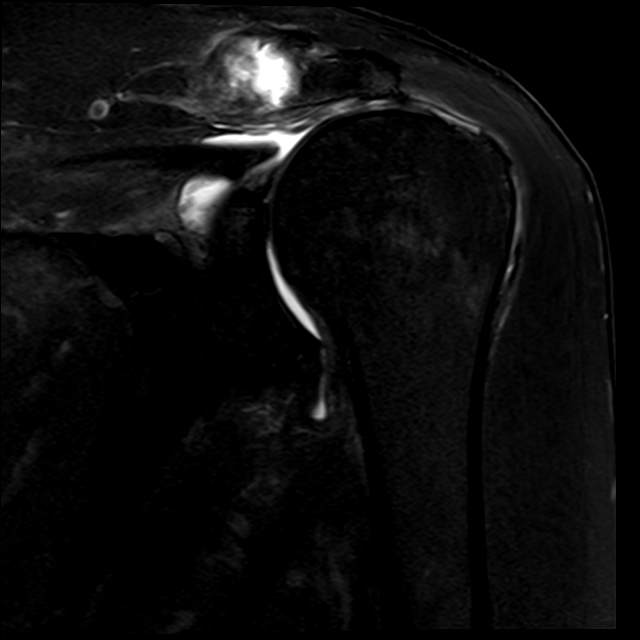
[im 17/21]
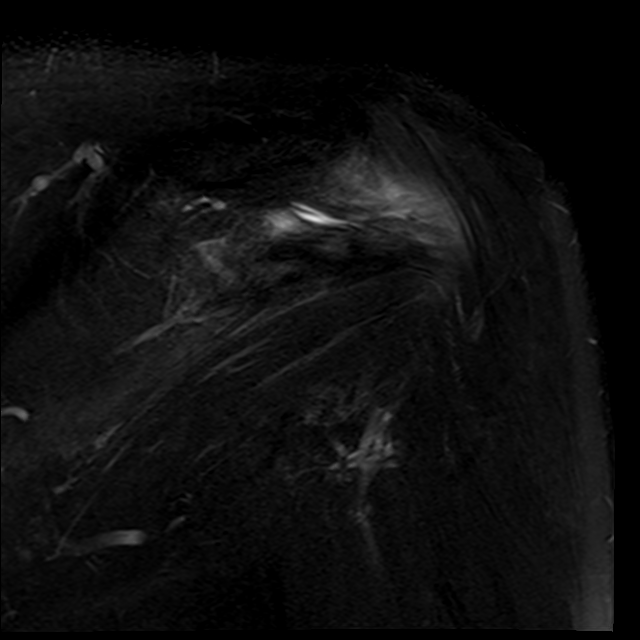

[Series 9: T2 fat-sat · oblique · left · 4.0mm · 0.44mm/px · 3 of 26 slices shown (3 of 3)]
[im 4/26]
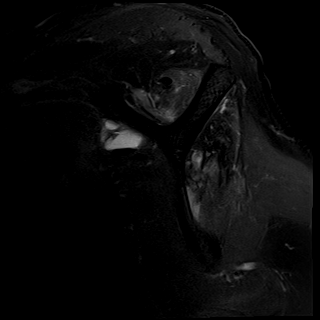
[im 13/26]
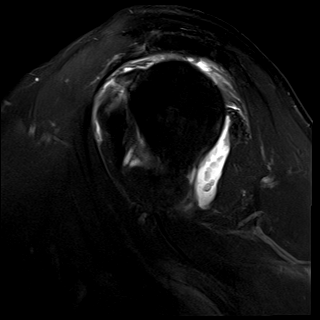
[im 22/26]
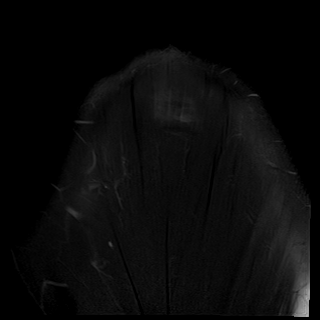

[Series 10: PD · oblique · left · 4.0mm · 0.22mm/px · 7 of 21 slices shown]
[im 1/21]
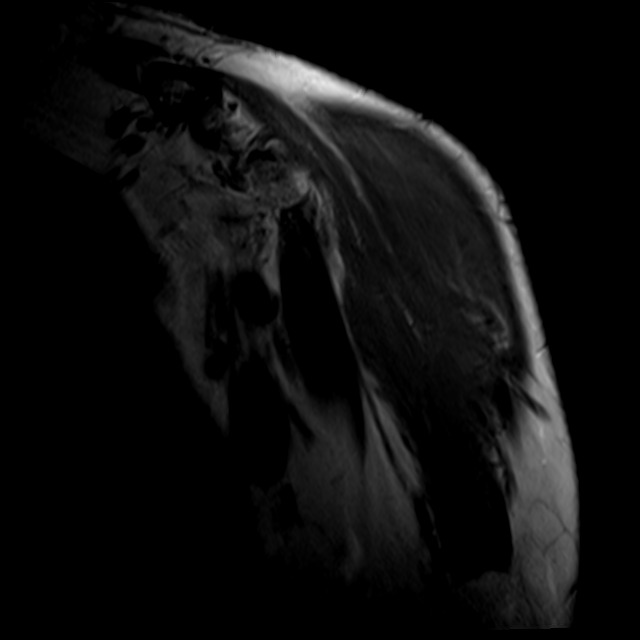
[im 4/21]
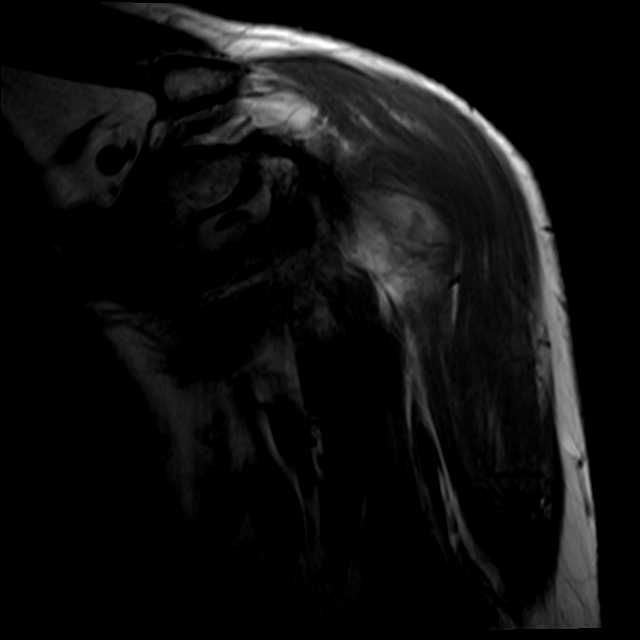
[im 7/21]
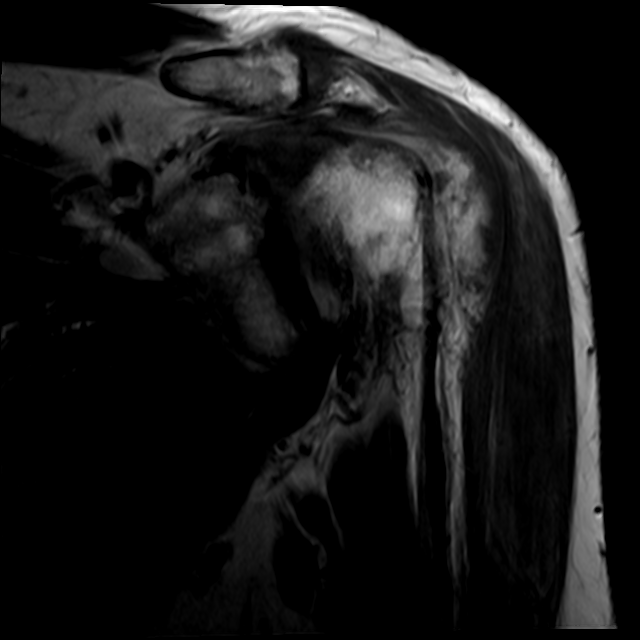
[im 11/21]
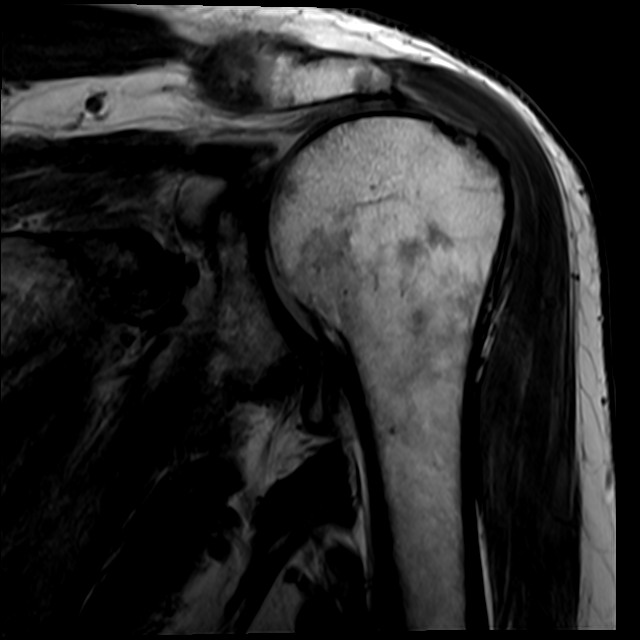
[im 14/21]
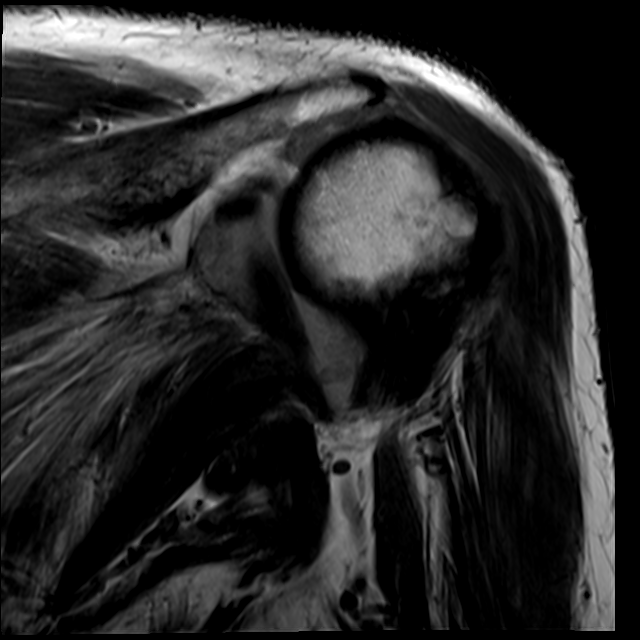
[im 17/21]
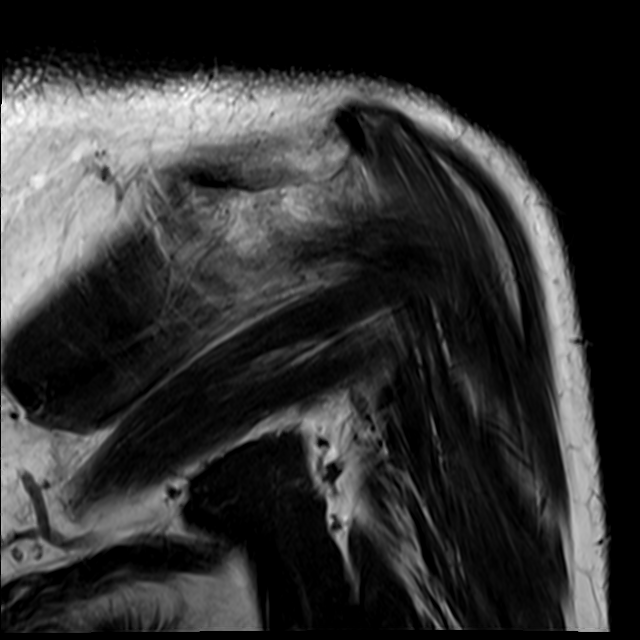
[im 21/21]
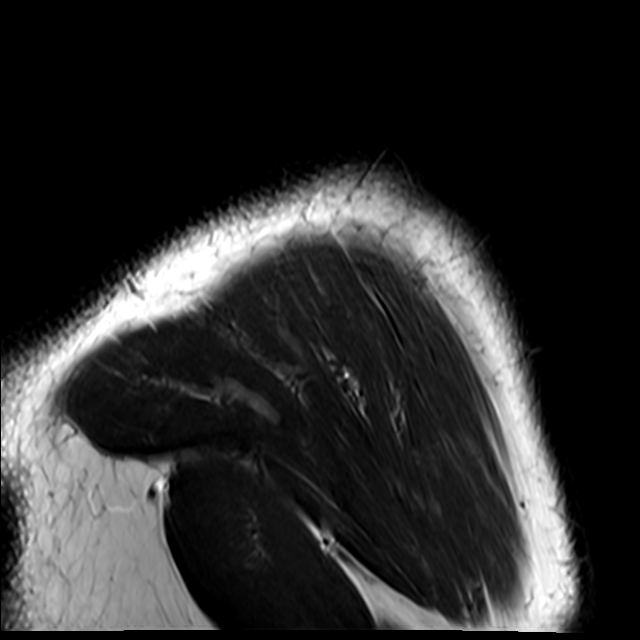

[20 of 40 positions shown; findings below may reference images not displayed]

FINDINGS: Rotator cuff: The supraspinatus and infraspinatus are completely
torn and retracted to the level the glenohumeral joint, 3.5-4.5 cm.
The patient has severe subscapularis tendinopathy. A partial width,
full-thickness tear of the superior aspect of the tendon measures
approximately 0.7 cm craniocaudal with approximately 1 cm of
retraction. The teres minor is intact.

Muscles: Moderately severe infraspinatus and moderate supraspinatus
fatty atrophy. Musculature is otherwise preserved.

Biceps long head: Intact. Severe tendinopathy of the intra-articular
segment.

Acromioclavicular Joint: Moderately severe osteoarthritis. Type 2
acromion. There is subacromial spurring. A small volume of fluid is
seen in the subacromial/subdeltoid bursa.

Glenohumeral Joint: The humeral head is high-riding due to the
patient's cuff tears. There is a moderate glenohumeral joint
effusion containing debris. Mild glenohumeral degenerative disease.

Labrum:  The superior labrum is severely blunted and degenerated.

Bones:  No fracture, contusion or worrisome lesion.

Other: None.
IMPRESSION: Complete supraspinatus and infraspinatus tendon tears with
retraction to the level of the glenohumeral joint. Moderately severe
infraspinatus and moderate supraspinatus fatty atrophy.

Severe subscapularis tendinopathy with a 0.7 cm craniocaudal
full-thickness tear of the superior fibers of the tendon. Retraction
is approximately 1 cm. No atrophy.

Severe tendinopathy of the intra-articular long head of biceps. The
tendon is intact.

Moderately severe acromioclavicular osteoarthritis. Type 2 acromion
and subacromial spurring noted.

High-riding humeral head due to the patient's rotator cuff tears.
There is a moderate debris containing glenohumeral joint effusion
and mild glenohumeral osteoarthritis.

## 2021-01-18 DIAGNOSIS — I2510 Atherosclerotic heart disease of native coronary artery without angina pectoris: Secondary | ICD-10-CM

## 2021-02-20 ENCOUNTER — Other Ambulatory Visit: Payer: Medicare Other

## 2021-02-20 ENCOUNTER — Other Ambulatory Visit: Payer: Self-pay

## 2021-02-20 DIAGNOSIS — I2510 Atherosclerotic heart disease of native coronary artery without angina pectoris: Secondary | ICD-10-CM

## 2021-02-20 LAB — LIPID PANEL
Chol/HDL Ratio: 2.8 ratio (ref 0.0–5.0)
Cholesterol, Total: 125 mg/dL (ref 100–199)
HDL: 45 mg/dL (ref >39)
LDL Chol Calc (NIH): 64 mg/dL (ref 0–99)
Triglycerides: 84 mg/dL (ref 0–149)
VLDL Cholesterol Cal: 16 mg/dL (ref 5–40)

## 2021-02-20 LAB — CBC WITH DIFFERENTIAL/PLATELET
Basophils Absolute: 0.1 10*3/uL (ref 0.0–0.2)
Basos: 1 %
EOS (ABSOLUTE): 0.2 10*3/uL (ref 0.0–0.4)
Eos: 3 %
Hematocrit: 42.1 % (ref 37.5–51.0)
Hemoglobin: 14.4 g/dL (ref 13.0–17.7)
Immature Grans (Abs): 0 10*3/uL (ref 0.0–0.1)
Immature Granulocytes: 0 %
Lymphocytes Absolute: 1.1 10*3/uL (ref 0.7–3.1)
Lymphs: 14 %
MCH: 32.8 pg (ref 26.6–33.0)
MCHC: 34.2 g/dL (ref 31.5–35.7)
MCV: 96 fL (ref 79–97)
Monocytes Absolute: 0.7 10*3/uL (ref 0.1–0.9)
Monocytes: 9 %
Neutrophils Absolute: 5.7 10*3/uL (ref 1.4–7.0)
Neutrophils: 73 %
Platelets: 220 10*3/uL (ref 150–450)
RBC: 4.39 x10E6/uL (ref 4.14–5.80)
RDW: 11.8 % (ref 11.6–15.4)
WBC: 7.9 10*3/uL (ref 3.4–10.8)

## 2021-02-20 LAB — COMPREHENSIVE METABOLIC PANEL
ALT: 15 IU/L (ref 0–44)
AST: 23 IU/L (ref 0–40)
Albumin/Globulin Ratio: 1 — ABNORMAL LOW (ref 1.2–2.2)
Albumin: 3.5 g/dL — ABNORMAL LOW (ref 3.7–4.7)
Alkaline Phosphatase: 60 IU/L (ref 44–121)
BUN/Creatinine Ratio: 11 (ref 10–24)
BUN: 12 mg/dL (ref 8–27)
Bilirubin Total: 0.6 mg/dL (ref 0.0–1.2)
CO2: 25 mmol/L (ref 20–29)
Calcium: 8.8 mg/dL (ref 8.6–10.2)
Chloride: 100 mmol/L (ref 96–106)
Creatinine, Ser: 1.05 mg/dL (ref 0.76–1.27)
Globulin, Total: 3.5 g/dL (ref 1.5–4.5)
Glucose: 98 mg/dL (ref 65–99)
Potassium: 4.4 mmol/L (ref 3.5–5.2)
Sodium: 136 mmol/L (ref 134–144)
Total Protein: 7 g/dL (ref 6.0–8.5)
eGFR: 74 mL/min/{1.73_m2} (ref >59)

## 2021-02-24 ENCOUNTER — Ambulatory Visit: Payer: Medicare Other | Admitting: Cardiovascular Disease

## 2021-03-03 ENCOUNTER — Ambulatory Visit (INDEPENDENT_AMBULATORY_CARE_PROVIDER_SITE_OTHER): Payer: Medicare Other | Admitting: Cardiovascular Disease

## 2021-03-03 ENCOUNTER — Other Ambulatory Visit: Payer: Self-pay

## 2021-03-03 ENCOUNTER — Encounter: Payer: Self-pay | Admitting: Cardiovascular Disease

## 2021-03-03 VITALS — BP 120/76 | HR 86 | Ht 72.0 in | Wt 196.0 lb

## 2021-03-03 DIAGNOSIS — I714 Abdominal aortic aneurysm, without rupture, unspecified: Secondary | ICD-10-CM

## 2021-03-03 DIAGNOSIS — I6523 Occlusion and stenosis of bilateral carotid arteries: Secondary | ICD-10-CM | POA: Diagnosis not present

## 2021-03-03 DIAGNOSIS — I4819 Other persistent atrial fibrillation: Secondary | ICD-10-CM

## 2021-03-03 DIAGNOSIS — I2510 Atherosclerotic heart disease of native coronary artery without angina pectoris: Secondary | ICD-10-CM

## 2021-03-03 DIAGNOSIS — E782 Mixed hyperlipidemia: Secondary | ICD-10-CM | POA: Diagnosis not present

## 2021-03-03 NOTE — Patient Instructions (Signed)
Medication Instructions:  Your provider recommends that you continue on your current medications as directed. Please refer to the Current Medication list given to you today.   *If you need a refill on your cardiac medications before your next appointment, please call your pharmacy*  Testing/Procedures: Your physician has requested that you have a carotid duplex prior to your visit in 1 year. This test is an ultrasound of the carotid arteries in your neck. It looks at blood flow through these arteries that supply the brain with blood. Allow one hour for this exam. There are no restrictions or special instructions.  Your physician has requested that you have an abdominal aorta duplex prior to your visit in 1 year. During this test, an ultrasound is used to evaluate the aorta. Allow 30 minutes for this exam. Do not eat after midnight the day before and avoid carbonated beverages   Follow-Up: At [facility], you and your health needs are our priority.  As part of our continuing mission to provide you with exceptional heart care, we have created designated Provider Care Teams.  These Care Teams include your primary Cardiologist (physician) and Advanced Practice Providers (APPs -  Physician Assistants and Nurse Practitioners) who all work together to provide you with the care you need, when you need it. Your next appointment:   12 month(s) The format for your next appointment:   In Person Provider:   You may see [name], MD or one of the following Advanced Practice Providers on your designated Care Team:    Tereso Newcomer, PA-C  [place], [place]

## 2021-03-03 NOTE — Progress Notes (Signed)
Cardiology Office Note:    Date:  03/03/2021   ID:  Leroy Lara, DOB 1946/09/08, MRN [id number]  PCP:  Gala Lewandowsky   [facility]  Cardiologist:  [name], MD  Advanced Practice Provider:  No care team member to display Electrophysiologist:  None       Referring MD: Richmond Campbell., PA-C   Chief Complaint  Patient presents with  . Atrial Fibrillation    History of Present Illness:    Leroy Lara is a 75 y.o. male with a hx of permanent atrial fibrillation with slow ventricular rate, hypertension, abdominal aortic aneurysm, and mild anemia nonobstructive carotid stenosis, presenting for follow-up evaluation today.  The patient is here alone.  He has been doing well from a cardiac perspective.  He denies any new symptoms and specifically denies chest pain, chest pressure, lightheadedness, presyncope, or heart palpitations.  No orthopnea, PND, or leg swelling.  He is a lifelong Copy and is excited for the March Madness game tonight with [employer] against the [employer].  Past Medical History:  Diagnosis Date  . AAA (abdominal aortic aneurysm) (HCC)   . Atrial fibrillation, permanent (HCC)   . Carotid artery calcification, bilateral   . Essential hypertension   . Gastroesophageal reflux disease     History reviewed. No pertinent surgical history.  Current Medications: Current Meds  Medication Sig  . clobetasol ointment (TEMOVATE) 0.05 %   . ELIQUIS 5 MG TABS tablet TAKE 1 TABLET BY MOUTH TWICE A DAY  . hydrochlorothiazide (HYDRODIURIL) 25 MG tablet TAKE 1 TABLET BY MOUTH EVERY DAY  . losartan (COZAAR) 100 MG tablet TAKE 1 TABLET BY MOUTH EVERY DAY  . omeprazole (PRILOSEC) 20 MG capsule Take 20 mg by mouth daily.  . rosuvastatin (CRESTOR) 20 MG tablet TAKE 1 TABLET BY MOUTH EVERY DAY     Allergies:   Sulfamethoxazole, Sulfasalazine, and Sulfonamide derivatives   Social History   Socioeconomic History  . Marital  status: Married    Spouse name: Not on file  . Number of children: Not on file  . Years of education: Not on file  . Highest education level: Not on file  Occupational History  . Not on file  Tobacco Use  . Smoking status: Former Games developer  . Smokeless tobacco: Never Used  Vaping Use  . Vaping Use: Never used  Substance and Sexual Activity  . Alcohol use: Never  . Drug use: Never  . Sexual activity: Not on file  Other Topics Concern  . Not on file  Social History Narrative  . Not on file   Social Determinants of Health   Financial Resource Strain: Not on file  Food Insecurity: Not on file  Transportation Needs: Not on file  Physical Activity: Not on file  Stress: Not on file  Social Connections: Not on file     Family History: The patient's family history includes Cancer - Lung in his father; Coronary artery disease (age of onset: 52) in his mother; Heart failure in his sister; Lymphoma (age of onset: 67) in his brother; Pulmonary fibrosis in his brother.  ROS:   Please see the history of present illness.    All other systems reviewed and are negative.  EKGs/Labs/Other Studies Reviewed:    The following studies were reviewed today: Abdominal US 02/01/2020: Summary:  Abdominal Aorta: There is evidence of abnormal dilatation of the distal  Abdominal aorta. The largest aortic measurement is 3.0 cm. The  largest  aortic diameter remains essentially unchanged compared to prior exam.  Previous diameter measurement was 3.2 cm  obtained on 01/2019.  IVC/Iliac: There is no evidence of thrombus involving the IVC.   Carotid US 02/05/2019: Summary:  Right Carotid: Velocities in the right ICA are consistent with a 1-39%  stenosis.         The RICA velocities remain within normal range and stable         compared to the prior exam.    Left Carotid: Velocities in the left ICA are consistent with a 1-39%  stenosis.        The LICA velocities remain within  normal range and stable  compared        to the prior exam.   Vertebrals: Bilateral vertebral arteries demonstrate antegrade flow.    Subclavians: Right subclavian artery was stenotic. Bilateral subclavian  artery        flow was disturbed.   *See table(s) above for measurements and observations.    CTA Coronary - 09/23/2019: IMPRESSION: 1. Coronary artery calcium score 546 Agatston units. This places the patient in the 70th percentile for age and gender, suggesting intermediate risk for future cardiac events.  2.  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  3.  Possible moderate mid LAD stenosis.  Will send for FFR.  Echo 01/06/2020: 1. Left ventricular ejection fraction, by visual estimation, is 60 to  65%. The left ventricle has normal function. There is no left ventricular  hypertrophy.  2. Left ventricular diastolic parameters are indeterminate.  3. The left ventricle has no regional wall motion abnormalities.  4. Global right ventricle has normal systolic function.The right  ventricular size is normal. No increase in right ventricular wall  thickness.  5. Left atrial size was moderately dilated.  6. Right atrial size was mildly dilated.  7. The mitral valve is normal in structure. Trivial mitral valve  regurgitation. No evidence of mitral stenosis.  8. The tricuspid valve is normal in structure.  9. The tricuspid valve is normal in structure. Tricuspid valve  regurgitation is not demonstrated.  10. The aortic valve is tricuspid. Aortic valve regurgitation is trivial.  Mild aortic valve sclerosis without stenosis.  11. The pulmonic valve was normal in structure. Pulmonic valve  regurgitation is not visualized.  12. Normal pulmonary artery systolic pressure.  13. The inferior vena cava is normal in size with greater than 50%  respiratory variability, suggesting right atrial pressure of 3 mmHg.  EKG:  EKG is not ordered today.   Recent  Labs: 02/20/2021: ALT 15; BUN 12; Creatinine, Ser 1.05; Hemoglobin 14.4; Platelets 220; Potassium 4.4; Sodium 136  Recent Lipid Panel    Component Value Date/Time   CHOL 125 02/20/2021 0824   TRIG 84 02/20/2021 0824   HDL 45 02/20/2021 0824   CHOLHDL 2.8 02/20/2021 0824   CHOLHDL 3.0 01/10/2016 0759   VLDL 19 01/10/2016 0759   LDLCALC 64 02/20/2021 0824   LDLDIRECT 154.8 08/26/2013 0739     Risk Assessment/Calculations:    CHA2DS2-VASc Score = 3  This indicates a 3.2% annual risk of stroke. The patient's score is based upon: CHF History: No HTN History: Yes Diabetes History: No Stroke History: No Vascular Disease History: Yes Age Score: 1 Gender Score: 0      Physical Exam:    VS:  BP 120/76   Pulse 86   Ht 6' (1.829 m)   Wt 196 lb (88.9 kg)   SpO2 98%  BMI 26.58 kg/m     Wt Readings from Last 3 Encounters:  03/03/21 196 lb (88.9 kg)  08/17/20 194 lb 3.2 oz (88.1 kg)  06/27/20 196 lb (88.9 kg)     GEN:  Well nourished, well developed in no acute distress HEENT: Normal NECK: No JVD; No carotid bruits LYMPHATICS: No lymphadenopathy CARDIAC: irregularly irregular, no murmurs, rubs, gallops RESPIRATORY:  Clear to auscultation without rales, wheezing or rhonchi  ABDOMEN: Soft, non-tender, non-distended MUSCULOSKELETAL:  No edema; No deformity  SKIN: Warm and dry NEUROLOGIC:  Alert and oriented x 3 PSYCHIATRIC:  Normal affect   ASSESSMENT:    1. Persistent atrial fibrillation (HCC)   2. AAA (abdominal aortic aneurysm) without rupture (HCC)   3. Mixed hyperlipidemia   4. Coronary artery disease involving native coronary artery of native heart without angina pectoris   5. Bilateral carotid artery stenosis    PLAN:    In order of problems listed above:  1. The patient is doing well.  He is tolerating oral anticoagulation without bleeding problems.  His heart rate on my check is 51 bpm and this is his typical heart rate.  He has no bradycardic symptoms.   He has undergone event monitoring in the past with no pathologic pauses.  Continue observation and oral anticoagulation with apixaban.  Most recent labs are reviewed with a normal hemoglobin of 14.4. 2. Due for surveillance ultrasound in 1 year.  Last year's ultrasound reviewed as outlined above. 3. Lipids are excellent with a cholesterol of 125, HDL 45, LDL 64.  ALT is normal at 15.  Continue rosuvastatin 20 mg daily. 4. Patient without anginal symptoms.  CTA findings outlined above.  CT-FFR was negative for any ischemia.  Continue current management.  Not a candidate for beta-blocker because of bradycardia.  Medication Adjustments/Labs and Tests Ordered: Current medicines are reviewed at length with the patient today.  Concerns regarding medicines are outlined above.  Orders Placed This Encounter  Procedures  . VAS US CAROTID   No orders of the defined types were placed in this encounter.   Patient Instructions  Medication Instructions:  Your provider recommends that you continue on your current medications as directed. Please refer to the Current Medication list given to you today.   *If you need a refill on your cardiac medications before your next appointment, please call your pharmacy*  Testing/Procedures: Your physician has requested that you have a carotid duplex prior to your visit in 1 year. This test is an ultrasound of the carotid arteries in your neck. It looks at blood flow through these arteries that supply the brain with blood. Allow one hour for this exam. There are no restrictions or special instructions.  Your physician has requested that you have an abdominal aorta duplex prior to your visit in 1 year. During this test, an ultrasound is used to evaluate the aorta. Allow 30 minutes for this exam. Do not eat after midnight the day before and avoid carbonated beverages   Follow-Up: At [facility], you and your health needs are our priority.  As part of our continuing  mission to provide you with exceptional heart care, we have created designated Provider Care Teams.  These Care Teams include your primary Cardiologist (physician) and Advanced Practice Providers (APPs -  Physician Assistants and Nurse Practitioners) who all work together to provide you with the care you need, when you need it. Your next appointment:   12 month(s) The format for your next appointment:  In Person Provider:   You may see [name], MD or one of the following Advanced Practice Providers on your designated Care Team:    Tereso Newcomer, PA-C  [place], [place]      Signed, [name], MD  03/03/2021 12:19 PM    [facility]

## 2021-03-21 ENCOUNTER — Ambulatory Visit (INDEPENDENT_AMBULATORY_CARE_PROVIDER_SITE_OTHER): Payer: Medicare Other

## 2021-03-21 ENCOUNTER — Encounter: Payer: Self-pay | Admitting: Orthopedic Surgery

## 2021-03-21 ENCOUNTER — Ambulatory Visit (INDEPENDENT_AMBULATORY_CARE_PROVIDER_SITE_OTHER): Payer: Medicare Other | Admitting: Physician Assistant

## 2021-03-21 DIAGNOSIS — I6523 Occlusion and stenosis of bilateral carotid arteries: Secondary | ICD-10-CM

## 2021-03-21 DIAGNOSIS — G8929 Other chronic pain: Secondary | ICD-10-CM

## 2021-03-21 DIAGNOSIS — M25551 Pain in right hip: Secondary | ICD-10-CM

## 2021-03-21 DIAGNOSIS — M542 Cervicalgia: Secondary | ICD-10-CM

## 2021-03-21 DIAGNOSIS — M5441 Lumbago with sciatica, right side: Secondary | ICD-10-CM

## 2021-03-21 MED ORDER — PREDNISONE 10 MG PO TABS: 10.0000 mg | ORAL_TABLET | Freq: Every day | ORAL | 1 refills | Status: DC

## 2021-03-21 NOTE — Progress Notes (Signed)
Office Visit Note   Patient: Leroy Lara           Date of Birth: [birth year]           MRN: [id number] Visit Date: 03/21/2021              Requested by: Richmond Campbell., PA-C [address]  [place] [address] PCP: Richmond Campbell., PA-C  Chief Complaint  Patient presents with  . Neck - Pain  . Lower Back - Pain  . Right Hip - Pain      HPI: Patient is a pleasant 75 year old gentleman with a history of right sided sciatica.  He has been trying to keep his body strong working with a physical therapist and doing yoga.  He feels he has lost some of his motion and feels very stiff when he first arises out of bed in the morning this is been going on for a while.  He denies any weakness or paresthesias.  He has also had a history of chronic neck pain which was treated with massage by Dr. Prince Rome denies any groin pain  Assessment & Plan: Visit Diagnoses:  1. Pain in right hip   2. Chronic bilateral low back pain with right-sided sciatica   3. Neck pain     Plan: We will try the patient on a short course of prednisone we will follow-up for reevaluation in him 3 weeks.  If he has no improvement would recommend an MRI of his lumbar spine  Follow-Up Instructions: No follow-ups on file.   Ortho Exam  Patient is alert, oriented, no adenopathy, well-dressed, normal affect, normal respiratory effort. Neck he has decreased flexion extension side to side motion which she says is stable.  No tenderness to palpation back he has pain with extension positive straight leg raise.  Good dorsiflexion plantarflexion strength no specific tenderness  Imaging: XR HIP UNILAT W OR W/O PELVIS 2-3 VIEWS RIGHT  Result Date: 03/21/2021 2 views of his hip demonstrate no acute osseous abnormalities just minor spurring femoral head is well reduced in the acetabulum  XR Cervical Spine 2 or 3 views  Result Date: 03/21/2021 Cervical spine x-rays do show some chronic degenerative changes no acute  osseous changes  XR Lumbar Spine 2-3 Views  Result Date: 03/21/2021 2 views of his lumbar spine overall well-maintained alignment some joint space narrowing no acute osseous changes  No images are attached to the encounter.  Labs: No results found for: HGBA1C, ESRSEDRATE, CRP, LABURIC, REPTSTATUS, GRAMSTAIN, CULT, LABORGA   Lab Results  Component Value Date   ALBUMIN 3.5 (L) 02/20/2021   ALBUMIN 4.0 06/27/2020   ALBUMIN 3.8 09/01/2019    No results found for: MG No results found for: VD25OH  No results found for: PREALBUMIN CBC EXTENDED Latest Ref Rng & Units 02/20/2021 06/27/2020 09/01/2019  WBC 3.4 - 10.8 x10E3/uL 7.9 10.2 9.2  RBC 4.14 - 5.80 x10E6/uL 4.39 4.39 4.52  HGB 13.0 - 17.7 g/dL 57.0 17.7 93.9  HCT 03.0 - 51.0 % 42.1 42.5 44.1  PLT 150 - 450 x10E3/uL 220 249 281  NEUTROABS 1.4 - 7.0 x10E3/uL 5.7 - -  LYMPHSABS 0.7 - 3.1 x10E3/uL 1.1 - -     There is no height or weight on file to calculate BMI.  Orders:  Orders Placed This Encounter  Procedures  . XR Cervical Spine 2 or 3 views  . XR Lumbar Spine 2-3 Views  . XR HIP UNILAT W OR W/O PELVIS  2-3 VIEWS RIGHT   Meds ordered this encounter  Medications  . predniSONE (DELTASONE) 10 MG tablet    Sig: Take 1 tablet (10 mg total) by mouth daily with breakfast.    Dispense:  30 tablet    Refill:  1     Procedures: No procedures performed  Clinical Data: No additional findings.  ROS:  All other systems negative, except as noted in the HPI. Review of Systems  Objective: Vital Signs: There were no vitals taken for this visit.  Specialty Comments:  No specialty comments available.  PMFS History: Patient Active Problem List   Diagnosis Date Noted  . Carpal tunnel syndrome of left wrist 06/16/2018  . Right hip pain 01/29/2017  . Left hip pain 01/29/2017  . Abnormal EKG 01/28/2012  . Hypertension, benign 01/28/2012  . Right carotid bruit 01/28/2012   Past Medical History:  Diagnosis Date  . AAA  (abdominal aortic aneurysm) (HCC)   . Atrial fibrillation, permanent (HCC)   . Carotid artery calcification, bilateral   . Essential hypertension   . Gastroesophageal reflux disease     Family History  Problem Relation Age of Onset  . Coronary artery disease Mother 50       myocardial infarction  . Cancer - Lung Father   . Heart failure Sister   . Pulmonary fibrosis Brother   . Lymphoma Brother 37    No past surgical history on file. Social History   Occupational History  . Not on file  Tobacco Use  . Smoking status: Former Games developer  . Smokeless tobacco: Never Used  Vaping Use  . Vaping Use: Never used  Substance and Sexual Activity  . Alcohol use: Never  . Drug use: Never  . Sexual activity: Not on file

## 2021-03-31 ENCOUNTER — Other Ambulatory Visit: Payer: Self-pay | Admitting: Cardiovascular Disease

## 2021-03-31 DIAGNOSIS — I4891 Unspecified atrial fibrillation: Secondary | ICD-10-CM

## 2021-03-31 NOTE — Telephone Encounter (Signed)
Prescription refill request for Eliquis received. Indication: afib  Last office visit: Excell Seltzer 03/03/2021 Scr: 1.05, 02/20/2021 Age:  76 yo Weight: 88.9 kg   Pt is on the correct dose of Eliquis per dosing criteria, prescription refill sent for Eliquis 5mg  BID.

## 2021-04-11 ENCOUNTER — Encounter: Payer: Self-pay | Admitting: Orthopedic Surgery

## 2021-04-11 ENCOUNTER — Ambulatory Visit (INDEPENDENT_AMBULATORY_CARE_PROVIDER_SITE_OTHER): Payer: Medicare Other | Admitting: Physician Assistant

## 2021-04-11 DIAGNOSIS — M25551 Pain in right hip: Secondary | ICD-10-CM | POA: Diagnosis not present

## 2021-04-11 DIAGNOSIS — I6523 Occlusion and stenosis of bilateral carotid arteries: Secondary | ICD-10-CM

## 2021-04-11 NOTE — Progress Notes (Signed)
Office Visit Note   Patient: Leroy Lara           Date of Birth: 10-24-1946           MRN: [id number] Visit Date: 04/11/2021              Requested by: Richmond Campbell., PA-C [address]  [place] [address] PCP: Richmond Campbell., PA-C  Chief Complaint  Patient presents with  . Lower Back - Follow-up  . Right Hip - Follow-up      HPI: Presents today to follow-up on his lower back and right hip pain.  At his last visit he was given some oral prednisone which helped with the inflammation.  He still says he is having pain focused more around the right hip that radiates proximally and distally.  He notices pain especially before starting up in the morning.  He also has periodic sciatica.  He is active and tries to do yoga as well as strengthening  Assessment & Plan: Visit Diagnoses: No diagnosis found.  Plan: Patient was shown specific strengthening exercises.  He will add these to his routine.  Follow-up as needed.  Follow-Up Instructions: No follow-ups on file.   Ortho Exam  Patient is alert, oriented, no adenopathy, well-dressed, normal affect, normal respiratory effort. Focused examination demonstrates back symptoms have seem significantly abated he has 5 out of 5 strength negative straight leg raise.  He has equal internal or external and internal rotation of both hips slightly less external rotation of both hips with forward and backward stretching focuses to pain in the area of the groin.  No radicular symptoms present today  Imaging: No results found. No images are attached to the encounter.  Labs: No results found for: HGBA1C, ESRSEDRATE, CRP, LABURIC, REPTSTATUS, GRAMSTAIN, CULT, LABORGA   Lab Results  Component Value Date   ALBUMIN 3.5 (L) 02/20/2021   ALBUMIN 4.0 06/27/2020   ALBUMIN 3.8 09/01/2019    No results found for: MG No results found for: VD25OH  No results found for: PREALBUMIN CBC EXTENDED Latest Ref Rng & Units 02/20/2021  06/27/2020 09/01/2019  WBC 3.4 - 10.8 x10E3/uL 7.9 10.2 9.2  RBC 4.14 - 5.80 x10E6/uL 4.39 4.39 4.52  HGB 13.0 - 17.7 g/dL 13.2 44.0 10.2  HCT 72.5 - 51.0 % 42.1 42.5 44.1  PLT 150 - 450 x10E3/uL 220 249 281  NEUTROABS 1.4 - 7.0 x10E3/uL 5.7 - -  LYMPHSABS 0.7 - 3.1 x10E3/uL 1.1 - -     There is no height or weight on file to calculate BMI.  Orders:  No orders of the defined types were placed in this encounter.  No orders of the defined types were placed in this encounter.    Procedures: No procedures performed  Clinical Data: No additional findings.  ROS:  All other systems negative, except as noted in the HPI. Review of Systems  Objective: Vital Signs: There were no vitals taken for this visit.  Specialty Comments:  No specialty comments available.  PMFS History: Patient Active Problem List   Diagnosis Date Noted  . Carpal tunnel syndrome of left wrist 06/16/2018  . Right hip pain 01/29/2017  . Left hip pain 01/29/2017  . Abnormal EKG 01/28/2012  . Hypertension, benign 01/28/2012  . Right carotid bruit 01/28/2012   Past Medical History:  Diagnosis Date  . AAA (abdominal aortic aneurysm) (HCC)   . Atrial fibrillation, permanent (HCC)   . Carotid artery calcification, bilateral   .  Essential hypertension   . Gastroesophageal reflux disease     Family History  Problem Relation Age of Onset  . Coronary artery disease Mother 33       myocardial infarction  . Cancer - Lung Father   . Heart failure Sister   . Pulmonary fibrosis Brother   . Lymphoma Brother 37    No past surgical history on file. Social History   Occupational History  . Not on file  Tobacco Use  . Smoking status: Former Games developer  . Smokeless tobacco: Never Used  Vaping Use  . Vaping Use: Never used  Substance and Sexual Activity  . Alcohol use: Never  . Drug use: Never  . Sexual activity: Not on file

## 2021-05-18 ENCOUNTER — Other Ambulatory Visit: Payer: Self-pay | Admitting: Physician Assistant

## 2021-09-13 ENCOUNTER — Other Ambulatory Visit: Payer: Self-pay | Admitting: Cardiovascular Disease

## 2021-09-14 ENCOUNTER — Other Ambulatory Visit: Payer: Self-pay | Admitting: Cardiovascular Disease

## 2021-09-19 ENCOUNTER — Other Ambulatory Visit: Payer: Self-pay | Admitting: Internal Medicine

## 2021-09-19 ENCOUNTER — Other Ambulatory Visit: Payer: Self-pay | Admitting: Family Medicine

## 2021-09-19 DIAGNOSIS — R911 Solitary pulmonary nodule: Secondary | ICD-10-CM

## 2021-09-22 ENCOUNTER — Ambulatory Visit
Admission: RE | Admit: 2021-09-22 | Discharge: 2021-09-22 | Disposition: A | Discharge status: Home / Self Care | Payer: Medicare Other | Source: Ambulatory Visit | Attending: Family Medicine | Admitting: Family Medicine

## 2021-09-22 DIAGNOSIS — R911 Solitary pulmonary nodule: Secondary | ICD-10-CM

## 2021-09-22 IMAGING — CT CT CHEST W/O CM
1 series · 15 of 33 positions shown, 19 images · non-contrast
Comparison: [DATE] [DATE], [DATE], [DATE] [DATE], [DATE], [DATE] [DATE], [DATE]

CLINICAL DATA: Pulmonary nodule follow-up.

EXAM:
CT CHEST WITHOUT CONTRAST
TECHNIQUE: Multidetector CT imaging of the chest was performed following the
standard protocol without IV contrast.

[Series 2: chest w/(date) · axial · 0.70mm/px · z∈[-344,-62]mm · 15 of 165 slices shown, 19 images]
[im 13/165  mediastinal]
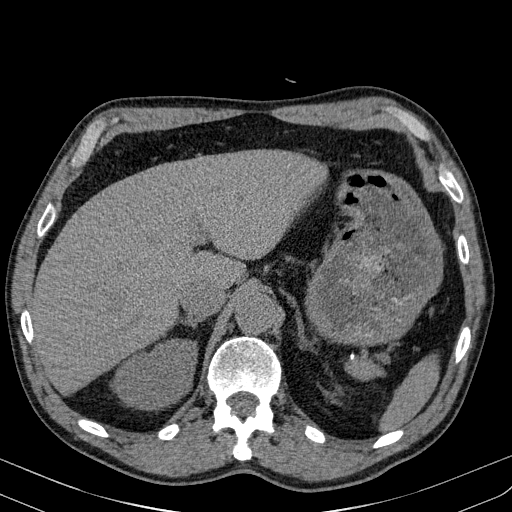
[im 13/165  lung]
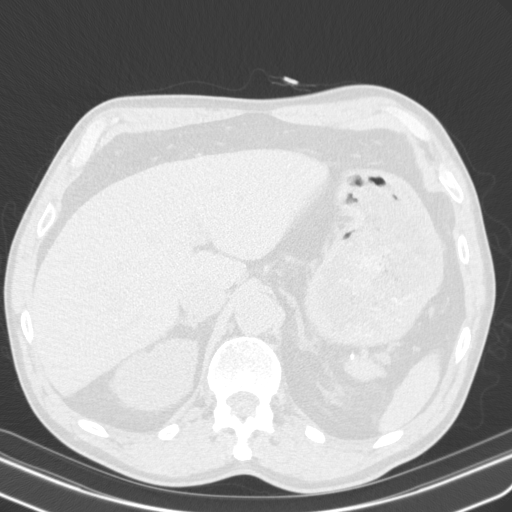
[im 25/165  lung]
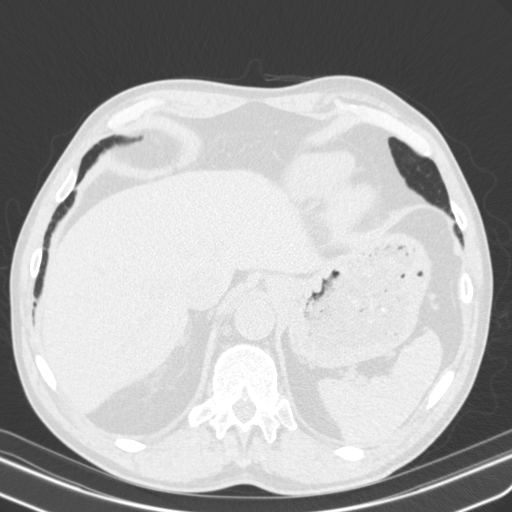
[im 33/165  lung]
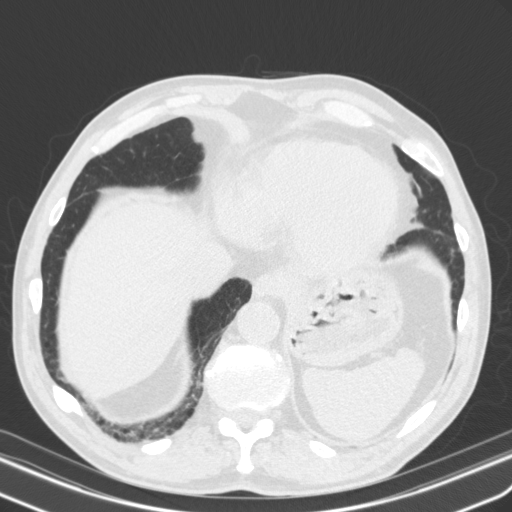
[im 43/165  lung]
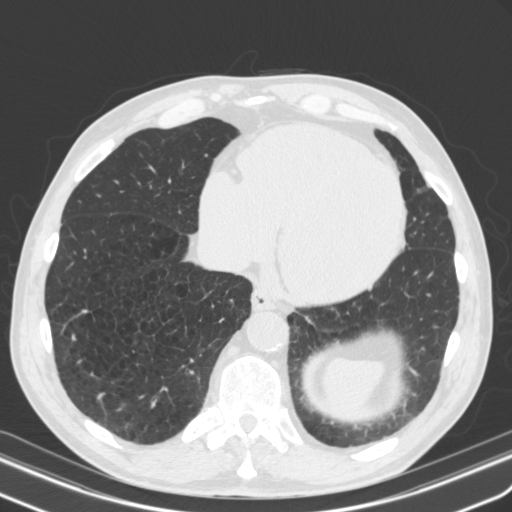
[im 55/165  mediastinal]
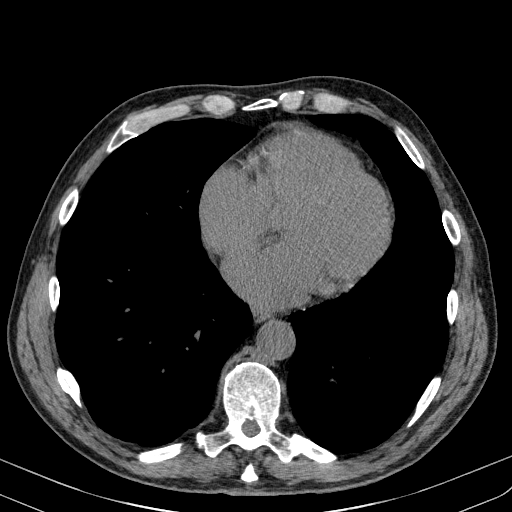
[im 55/165  lung]
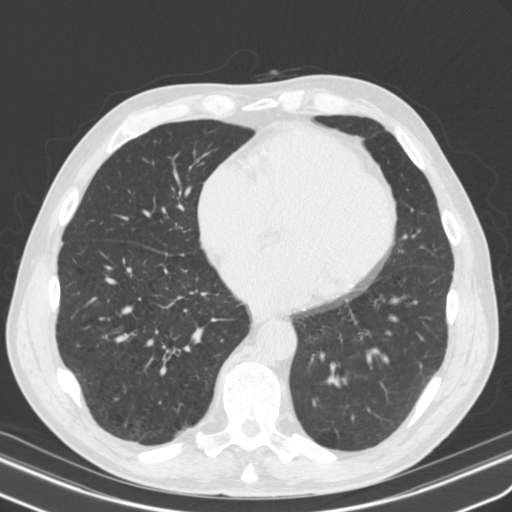
[im 66/165  lung]
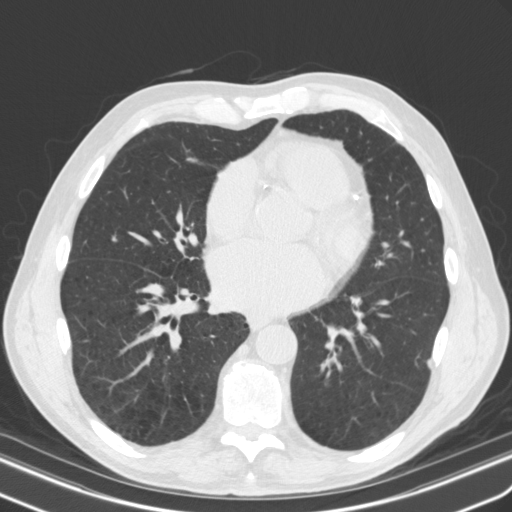
[im 73/165  lung]
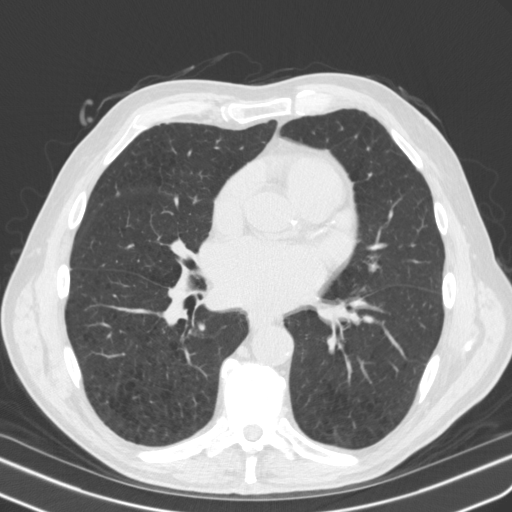
[im 86/165  lung]
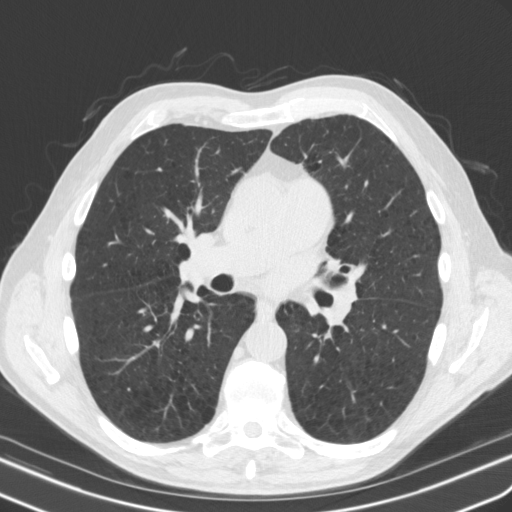
[im 92/165  mediastinal]
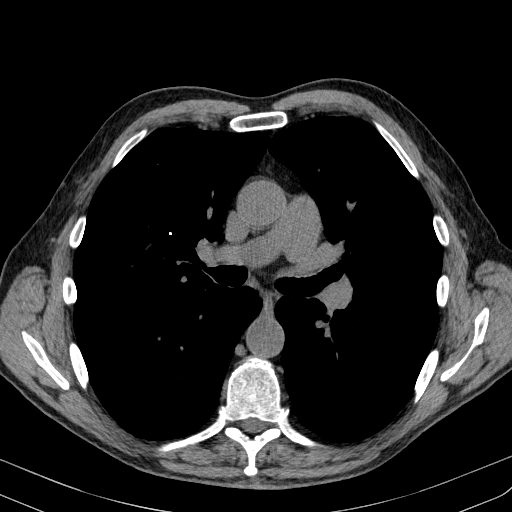
[im 92/165  lung]
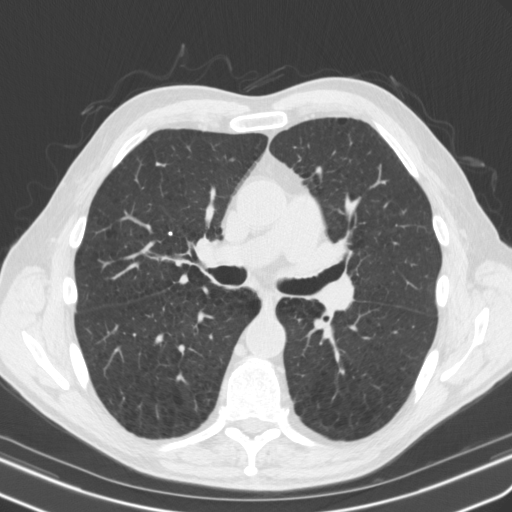
[im 99/165  lung]
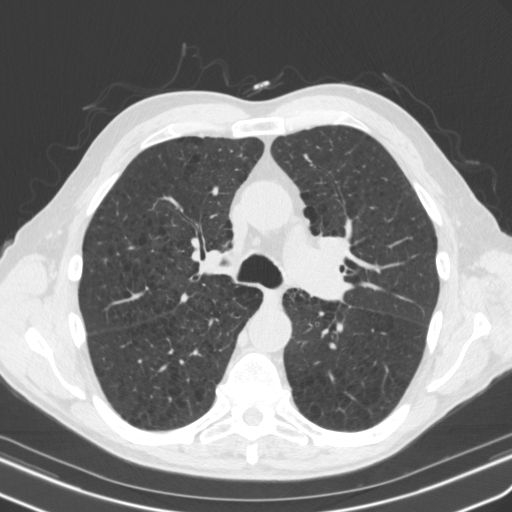
[im 110/165  lung]
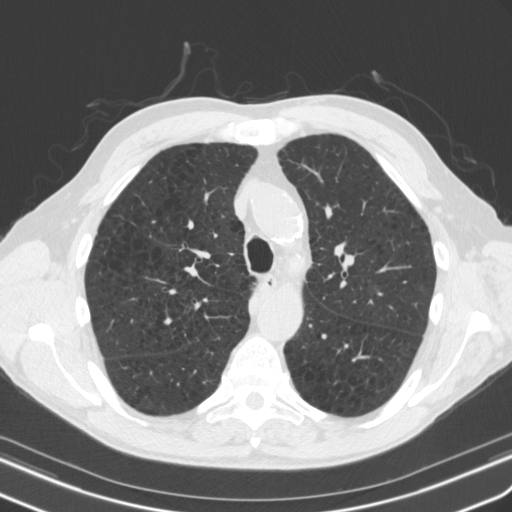
[im 122/165  lung]
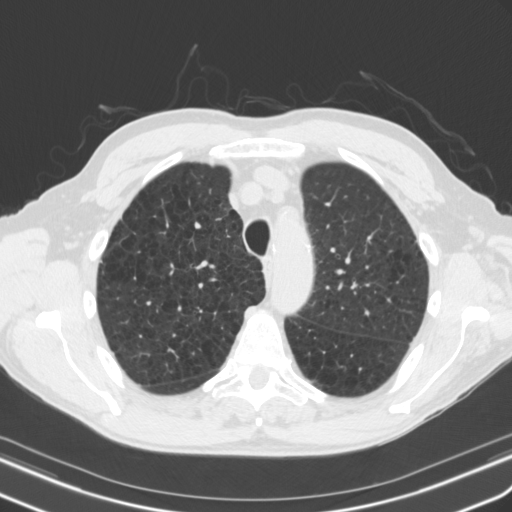
[im 132/165  mediastinal]
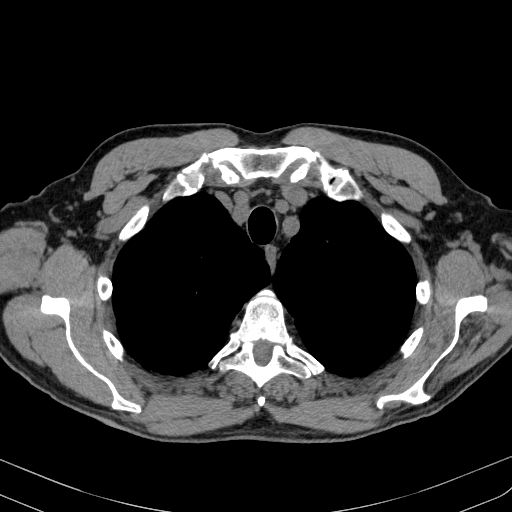
[im 132/165  lung]
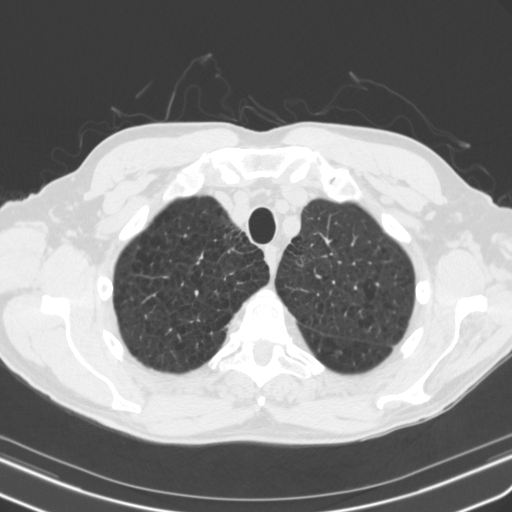
[im 140/165  lung]
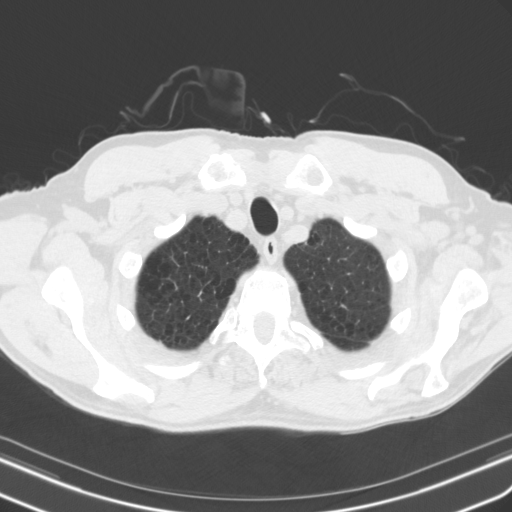
[im 152/165  lung]
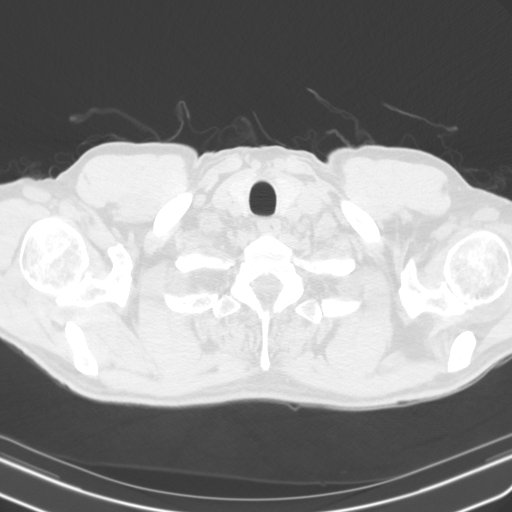

[15 of 33 positions shown; findings below may reference images not displayed]

FINDINGS: Cardiovascular: Three-vessel coronary artery atherosclerotic
calcifications. Atherosclerotic calcifications of the aorta. No
pericardial effusion. Mildly prominent cardiac size.

Mediastinum/Nodes: Visualized thyroid is unremarkable. No axillary
or mediastinal adenopathy.

Lungs/Pleura: No pleural effusion or pneumothorax. Moderate to
severe centrilobular emphysema.

Revisualization of scattered pulmonary nodules with representative
nodules as follows:

Nodule 1: LEFT lower lobe subpleural pulmonary nodule measures 10 by
6 mm, unchanged since [DATE] (series 5, image 73).

Nodule 2: LEFT lower lobe perivascular pulmonary nodule measures 6
by 4 mm, unchanged comparison to prior (series 5, image 99).

Nodule 3: RIGHT lower lobe subpleural pulmonary nodule measures 8 x
4 mm some unchanged comparison to prior (series 5, image 91).

Nodule 4: LEFT upper lobe pulmonary nodule measures 5 x 3 mm,
unchanged in comparison to prior (series 5, image 88).

Upper Abdomen: No acute abnormality.

Musculoskeletal: Remote RIGHT-sided rib fractures.
IMPRESSION: 1. Stable appearance of multiple bilateral pulmonary nodules since
[DATE] or [DATE], most consistent with a benign
etiology given stability for 18-24 months. Recommend evaluation for
candidacy for lung cancer annual chest CT screening given underlying
moderate to severe emphysema.

Aortic Atherosclerosis ([Z6]-[Z6]) and Emphysema ([Z6]-[Z6]).

## 2021-10-01 ENCOUNTER — Other Ambulatory Visit: Payer: Self-pay | Admitting: Cardiovascular Disease

## 2021-10-01 DIAGNOSIS — I4891 Unspecified atrial fibrillation: Secondary | ICD-10-CM

## 2021-10-02 NOTE — Telephone Encounter (Signed)
Prescription refill request for Eliquis received. Indication: Afib  Last office visit:03/03/21 ([place])  Scr: 1.05 (02/20/21)  Age: 75 Weight: 88.9kg  Appropriate dose and refill sent to requested pharmacy.  

## 2021-11-15 ENCOUNTER — Other Ambulatory Visit: Payer: Self-pay

## 2021-11-15 DIAGNOSIS — I4891 Unspecified atrial fibrillation: Secondary | ICD-10-CM

## 2021-11-15 MED ORDER — APIXABAN 5 MG PO TABS: 5.0000 mg | ORAL_TABLET | Freq: Two times a day (BID) | ORAL | 1 refills | Status: DC

## 2021-11-15 MED ORDER — LOSARTAN POTASSIUM 100 MG PO TABS: 100.0000 mg | ORAL_TABLET | Freq: Every day | ORAL | 0 refills | Status: DC

## 2021-11-15 MED ORDER — ROSUVASTATIN CALCIUM 20 MG PO TABS: 20.0000 mg | ORAL_TABLET | Freq: Every day | ORAL | 0 refills | Status: DC

## 2021-11-15 MED ORDER — HYDROCHLOROTHIAZIDE 25 MG PO TABS: 25.0000 mg | ORAL_TABLET | Freq: Every day | ORAL | 0 refills | Status: DC

## 2021-11-15 NOTE — Telephone Encounter (Signed)
Prescription refill request for Eliquis received. Indication: Afib  Last office visit:03/03/21 Leroy Lara)  Scr: 1.05 (02/20/21)  Age: 75 Weight: 88.9kg  Appropriate dose and refill sent to requested pharmacy.

## 2021-11-24 ENCOUNTER — Other Ambulatory Visit: Payer: Self-pay

## 2021-11-24 ENCOUNTER — Ambulatory Visit: Payer: Medicare Other

## 2021-11-24 ENCOUNTER — Ambulatory Visit (INDEPENDENT_AMBULATORY_CARE_PROVIDER_SITE_OTHER): Payer: Medicare Other | Admitting: Family

## 2021-11-24 DIAGNOSIS — I6523 Occlusion and stenosis of bilateral carotid arteries: Secondary | ICD-10-CM | POA: Diagnosis not present

## 2021-11-24 DIAGNOSIS — S3992XA Unspecified injury of lower back, initial encounter: Secondary | ICD-10-CM

## 2021-11-24 MED ORDER — PREDNISONE 10 MG PO TABS: 10.0000 mg | ORAL_TABLET | Freq: Every day | ORAL | 1 refills | Status: DC

## 2021-11-24 NOTE — Progress Notes (Signed)
Office Visit Note   Patient: Leroy Lara           Date of Birth: 07-Jan-1946           MRN: [id number] Visit Date: 11/24/2021              Requested by: Richmond Campbell., PA-C [address],  [place] [address] PCP: Richmond Campbell., PA-C  Chief Complaint  Patient presents with   Middle Back - Pain      HPI: The patient is a 75 year old gentleman who presents today complaining of mid back pain.  He does have history of sciatica.  He has been out working in the yard raking and collecting leaves quite a bit lately while he was doing this he felt a tweak in his back.  He has been able to do his activities of daily living but has been having significant pain.  This has been ongoing for this episode for about 3 weeks   no weakness no red flag symptoms  Assessment & Plan: Visit Diagnoses:  1. Lower back injury, initial encounter     Plan: Radiographs reassuring.  We will place him on a course of prednisone.  He will follow-up as needed  Follow-Up Instructions: No follow-ups on file.   Back Exam   Tenderness  The patient is experiencing tenderness in the lumbar.  Range of Motion  The patient has normal back ROM.  Muscle Strength  The patient has normal back strength.  Tests  Straight leg raise right: negative Straight leg raise left: negative  Other  Gait: abnormal      Patient is alert, oriented, no adenopathy, well-dressed, normal affect, normal respiratory effort.   Imaging: No results found. No images are attached to the encounter.  Labs: No results found for: HGBA1C, ESRSEDRATE, CRP, LABURIC, REPTSTATUS, GRAMSTAIN, CULT, LABORGA   Lab Results  Component Value Date   ALBUMIN 3.5 (L) 02/20/2021   ALBUMIN 4.0 06/27/2020   ALBUMIN 3.8 09/01/2019    No results found for: MG No results found for: VD25OH  No results found for: PREALBUMIN CBC EXTENDED Latest Ref Rng & Units 02/20/2021 06/27/2020 09/01/2019  WBC 3.4 - 10.8 x10E3/uL 7.9  10.2 9.2  RBC 4.14 - 5.80 x10E6/uL 4.39 4.39 4.52  HGB 13.0 - 17.7 g/dL 41.9 37.9 02.4  HCT 09.7 - 51.0 % 42.1 42.5 44.1  PLT 150 - 450 x10E3/uL 220 249 281  NEUTROABS 1.4 - 7.0 x10E3/uL 5.7 - -  LYMPHSABS 0.7 - 3.1 x10E3/uL 1.1 - -     There is no height or weight on file to calculate BMI.  Orders:  Orders Placed This Encounter  Procedures   XR Thoracic Spine 2 View   No orders of the defined types were placed in this encounter.    Procedures: No procedures performed  Clinical Data: No additional findings.  ROS:  All other systems negative, except as noted in the HPI. Review of Systems  Constitutional: Negative.   Musculoskeletal:  Positive for back pain and myalgias.   Objective: Vital Signs: There were no vitals taken for this visit.  Specialty Comments:  No specialty comments available.  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   Right hip pain 01/29/2017   Left hip pain 01/29/2017   Abnormal EKG 01/28/2012   Hypertension, benign 01/28/2012   Right carotid bruit 01/28/2012   Past Medical History:  Diagnosis  Date   AAA (abdominal aortic aneurysm) (HCC)    Atrial fibrillation, permanent (HCC)    Carotid artery calcification, bilateral    Essential hypertension    Gastroesophageal reflux disease     Family History  Problem Relation Age of Onset   Coronary artery disease Mother 68       myocardial infarction   Cancer - Lung Father    Heart failure Sister    Pulmonary fibrosis Brother    Lymphoma Brother 37    No past surgical history on file. Social History   Occupational History   Not on file  Tobacco Use   Smoking status: Former   Smokeless tobacco: Never  Building services engineer Use: Never used  Substance and Sexual Activity   Alcohol use: Never   Drug use: Never   Sexual activity: Not on file

## 2021-12-20 ENCOUNTER — Ambulatory Visit (INDEPENDENT_AMBULATORY_CARE_PROVIDER_SITE_OTHER): Payer: Medicare Other | Admitting: Family

## 2021-12-20 DIAGNOSIS — M5416 Radiculopathy, lumbar region: Secondary | ICD-10-CM | POA: Diagnosis not present

## 2021-12-20 MED ORDER — PREDNISONE 10 MG PO TABS: 10.0000 mg | ORAL_TABLET | Freq: Every day | ORAL | 1 refills | Status: DC

## 2021-12-20 NOTE — Progress Notes (Signed)
Office Visit Note   Patient: Leroy Lara           Date of Birth: 1946-03-09           MRN: [id number] Visit Date: 12/20/2021              Requested by: Richmond Campbell., PA-C [address]  [place] [address] PCP: Richmond Campbell., PA-C  Chief Complaint  Patient presents with   Lower Back - Pain      HPI: The patient is a 76 year old gentleman who is seen today for evaluation of worsening lumbar radiculopathy.  He was last seen on December 16 and prescribed prednisone he states that he took this for about 4 days and he did see improvement in his symptoms unfortunately last week he was getting something out of his truck in a standing position and simply twisted at the waist and had immediate worsening of his pain.  Right-sided low back pain which radiated down his leg posterior aspect.  He is currently having new groin pain he has pain with lying down pain with walking difficulty with ADLs due to pain.  He is resumed prednisone which has been helping ease his pain but has not completely resolved his symptoms.  Denies any red flag symptoms no loss of bowel or bladder no weakness   Assessment & Plan: Visit Diagnoses:  1. Lumbar radiculopathy     Plan: We will send him for lumbar MRI.  Also referred to Dr. Alvester Morin for but he may benefit from epidural steroid injection.  We will refill his prednisone in the interim. Follow-Up Instructions: Return mri review.   Back Exam   Tenderness  The patient is experiencing tenderness in the lumbar.  Range of Motion  The patient has normal back ROM.  Muscle Strength  The patient has normal back strength.  Tests  Straight leg raise right: negative Straight leg raise left: negative  Other  Gait: abnormal      Patient is alert, oriented, no adenopathy, well-dressed, normal affect, normal respiratory effort.   Imaging: No results found. No images are attached to the encounter.  Labs: No results found for:  HGBA1C, ESRSEDRATE, CRP, LABURIC, REPTSTATUS, GRAMSTAIN, CULT, LABORGA   Lab Results  Component Value Date   ALBUMIN 3.5 (L) 02/20/2021   ALBUMIN 4.0 06/27/2020   ALBUMIN 3.8 09/01/2019    No results found for: MG No results found for: VD25OH  No results found for: PREALBUMIN CBC EXTENDED Latest Ref Rng & Units 02/20/2021 06/27/2020 09/01/2019  WBC 3.4 - 10.8 x10E3/uL 7.9 10.2 9.2  RBC 4.14 - 5.80 x10E6/uL 4.39 4.39 4.52  HGB 13.0 - 17.7 g/dL 83.4 19.6 22.2  HCT 97.9 - 51.0 % 42.1 42.5 44.1  PLT 150 - 450 x10E3/uL 220 249 281  NEUTROABS 1.4 - 7.0 x10E3/uL 5.7 - -  LYMPHSABS 0.7 - 3.1 x10E3/uL 1.1 - -     There is no height or weight on file to calculate BMI.  Orders:  No orders of the defined types were placed in this encounter.  No orders of the defined types were placed in this encounter.    Procedures: No procedures performed  Clinical Data: No additional findings.  ROS:  All other systems negative, except as noted in the HPI. Review of Systems  Constitutional: Negative.   Musculoskeletal:  Positive for back pain and myalgias.   Objective: Vital Signs: There were no vitals taken for this visit.  Specialty Comments:  No  specialty comments available.  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   Right hip pain 01/29/2017   Left hip pain 01/29/2017   Abnormal EKG 01/28/2012   Hypertension, benign 01/28/2012   Right carotid bruit 01/28/2012   Past Medical History:  Diagnosis Date   AAA (abdominal aortic aneurysm) (HCC)    Atrial fibrillation, permanent (HCC)    Carotid artery calcification, bilateral    Essential hypertension    Gastroesophageal reflux disease     Family History  Problem Relation Age of Onset   Coronary artery disease Mother 35       myocardial infarction   Cancer - Lung Father    Heart failure Sister    Pulmonary fibrosis Brother    Lymphoma Brother 37     No past surgical history on file. Social History   Occupational History   Not on file  Tobacco Use   Smoking status: Former   Smokeless tobacco: Never  Building services engineer Use: Never used  Substance and Sexual Activity   Alcohol use: Never   Drug use: Never   Sexual activity: Not on file

## 2021-12-21 ENCOUNTER — Encounter: Payer: Self-pay | Admitting: Cardiovascular Disease

## 2021-12-21 DIAGNOSIS — I4819 Other persistent atrial fibrillation: Secondary | ICD-10-CM

## 2021-12-22 ENCOUNTER — Telehealth: Payer: Self-pay | Admitting: Physical Medicine and Rehabilitation

## 2021-12-22 NOTE — Telephone Encounter (Signed)
Pt returned call to [facility]. Please call pt at [phone]

## 2021-12-25 ENCOUNTER — Telehealth: Payer: Self-pay | Admitting: *Deleted

## 2021-12-25 DIAGNOSIS — I4819 Other persistent atrial fibrillation: Secondary | ICD-10-CM

## 2021-12-25 NOTE — Telephone Encounter (Signed)
Order for this pt to have an echo done for afib prior to seeing Dr. Excell Seltzer for his yearly visit on Apr 20, 2022, placed in the system. Pt made aware of this plan by Dr. Excell Seltzer himself, via [place] message sent on 12/21/21. Order placed and will send a message to our Echo Scheduler to reach out to the pt and arrange this appt.

## 2021-12-25 NOTE — Telephone Encounter (Signed)
-----   Message from [name], MD sent at 12/24/2021 10:39 AM EST ----- Please order echo for afib prior to next office visit thanks

## 2021-12-25 NOTE — Telephone Encounter (Signed)
RE: schedule echo for this pt a week or so prior to his OV with Dr. Excell Seltzer on 04/20/22 Received: Today Loma Sender, LPN He is scheduled on 04/05/2022. Patient is very nice!:)

## 2021-12-27 ENCOUNTER — Ambulatory Visit: Payer: Medicare Other | Admitting: Physical Medicine and Rehabilitation

## 2022-01-08 ENCOUNTER — Other Ambulatory Visit: Payer: Self-pay

## 2022-01-08 ENCOUNTER — Ambulatory Visit
Admission: RE | Admit: 2022-01-08 | Discharge: 2022-01-08 | Disposition: A | Discharge status: Home / Self Care | Payer: Medicare Other | Source: Ambulatory Visit | Attending: Family | Admitting: Family

## 2022-01-08 DIAGNOSIS — M5416 Radiculopathy, lumbar region: Secondary | ICD-10-CM

## 2022-01-08 IMAGING — MR MR LUMBAR SPINE W/O CM
4 of 5 series · 26 of 48 positions shown · non-contrast
Comparison: MR lumbar [DATE]; X-ray lumbar [DATE].

CLINICAL DATA: Lumbar radiculopathy. Low back pain radiating to
right hip for a few years.

EXAM:
MRI LUMBAR SPINE WITHOUT CONTRAST
TECHNIQUE: Multiplanar, multisequence MR imaging of the lumbar spine was
performed. No intravenous contrast was administered.

[Series 3: T2 · sagittal · 4.0mm · 0.59mm/px · 6 of 21 slices shown (1 of 2)]
[im 1/21]
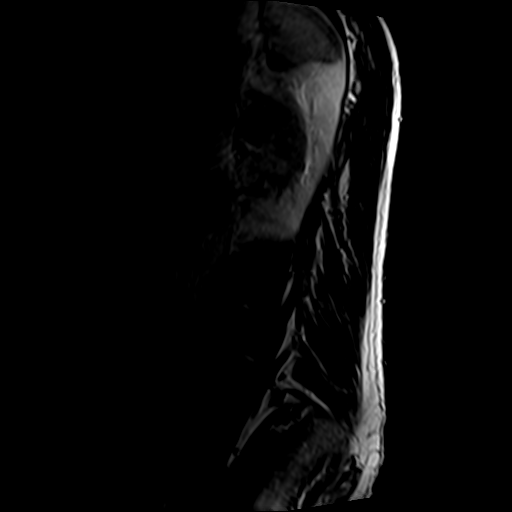
[im 5/21]
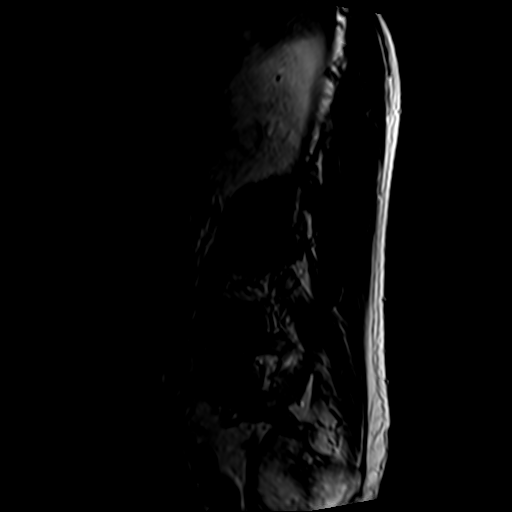
[im 9/21]
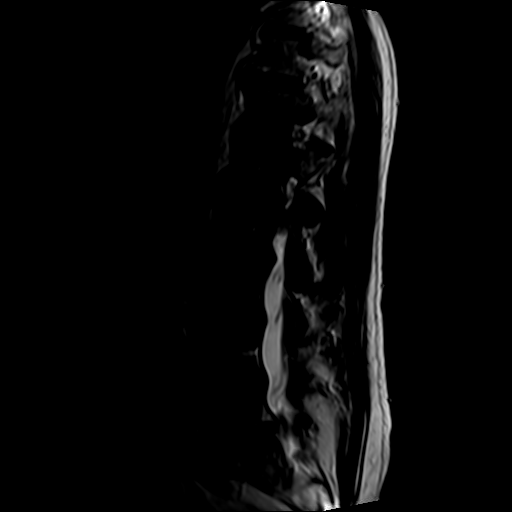
[im 13/21]
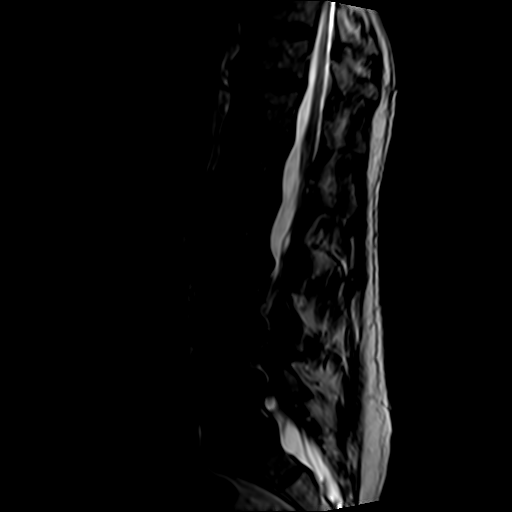
[im 17/21]
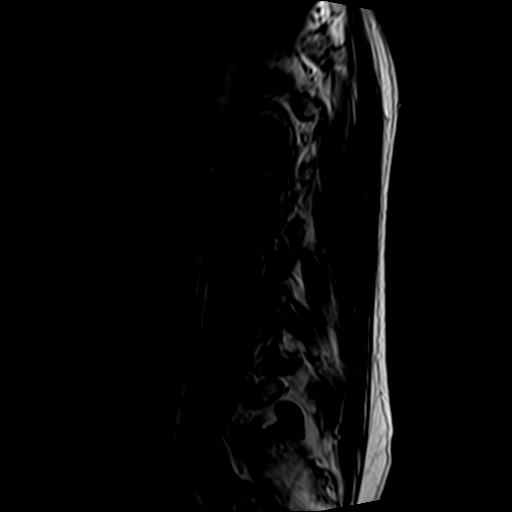
[im 21/21]
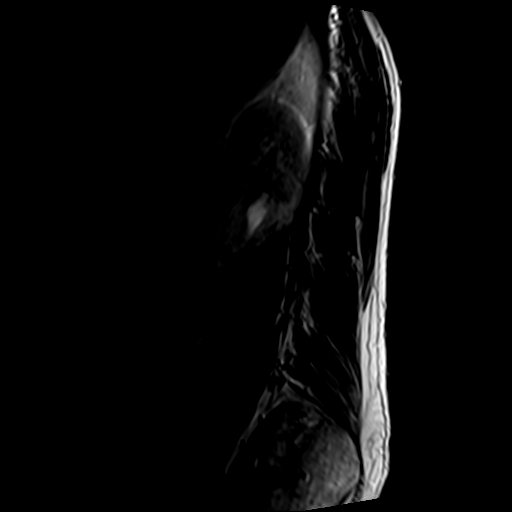

[Series 5: T1 · sagittal · 4.0mm · 0.59mm/px · 6 of 21 slices shown (1 of 2)]
[im 1/21]
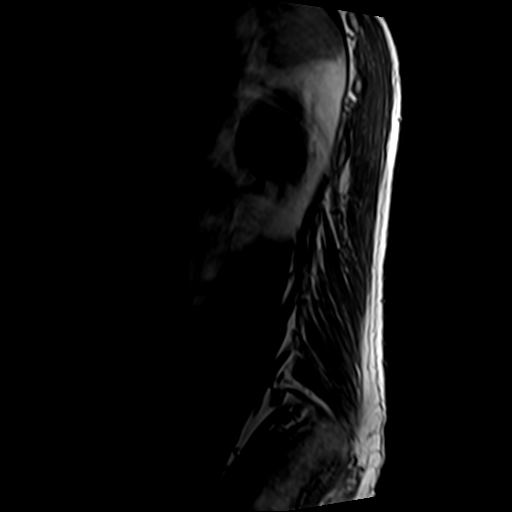
[im 5/21]
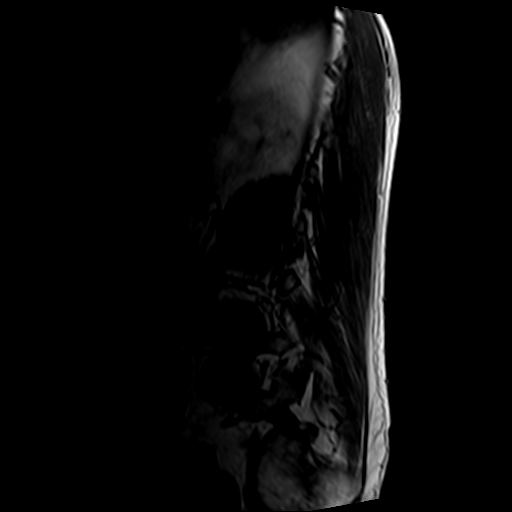
[im 9/21]
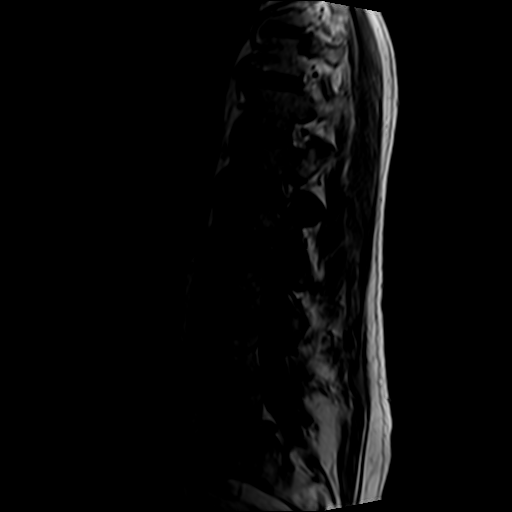
[im 13/21]
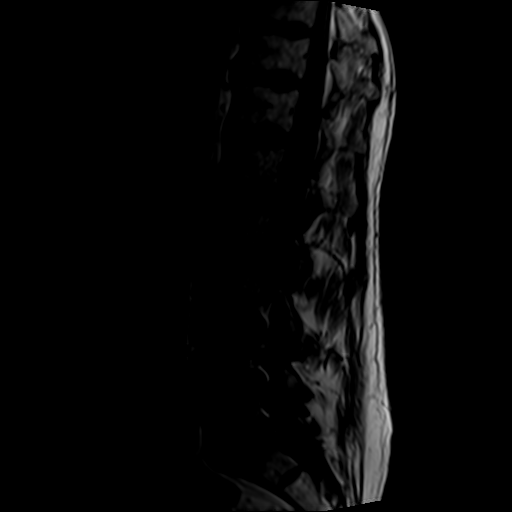
[im 17/21]
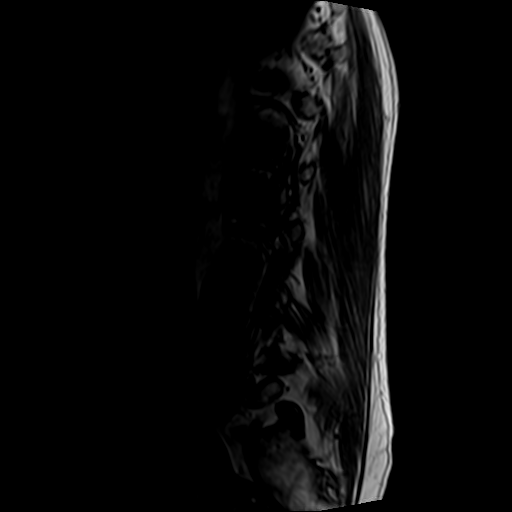
[im 21/21]
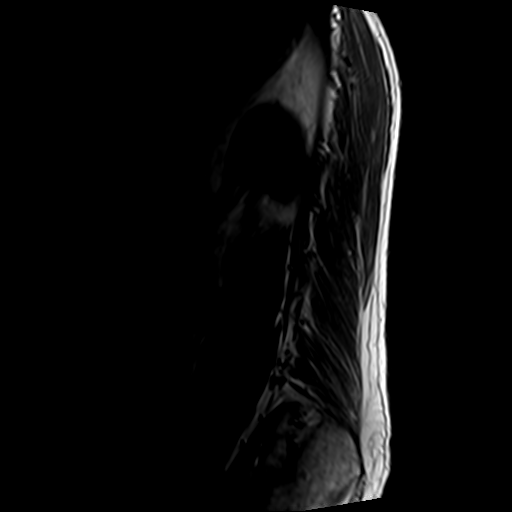

[Series 6: T2 · axial · 4.0mm · 0.82mm/px · z∈[-44,+242]mm · 9 of 55 slices shown (2 of 2)]
[im 1/55]
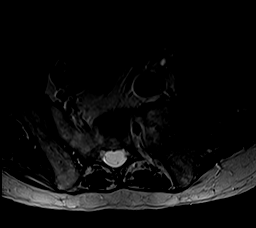
[im 8/55]
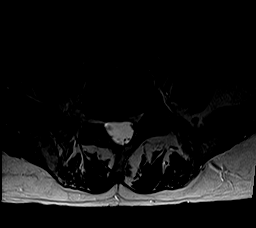
[im 16/55]
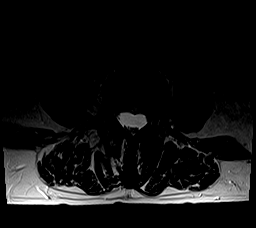
[im 24/55]
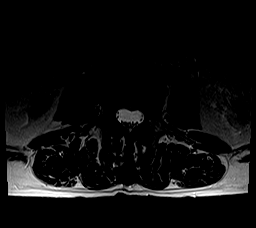
[im 28/55]
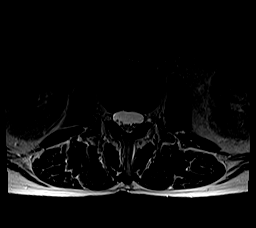
[im 31/55]
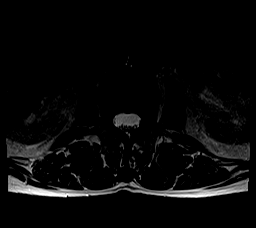
[im 39/55]
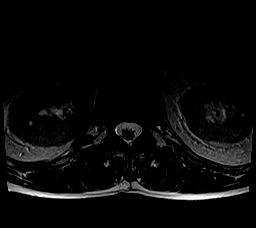
[im 47/55]
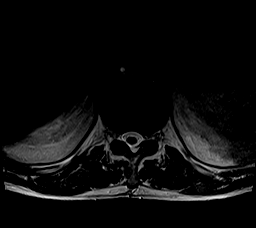
[im 55/55]
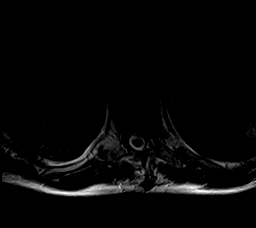

[Series 7: T1 · axial · 4.0mm · 0.41mm/px · z∈[-44,+201]mm · 5 of 55 slices shown (2 of 2)]
[im 1/55]
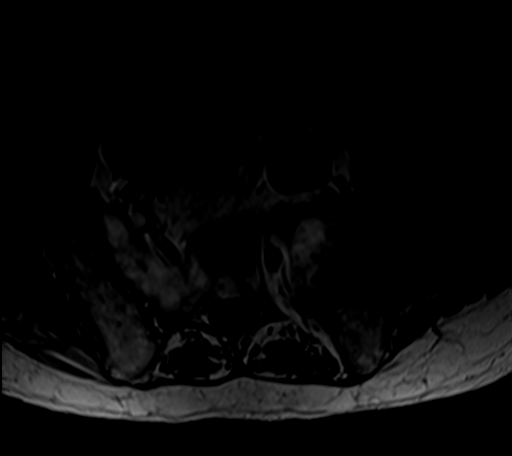
[im 8/55]
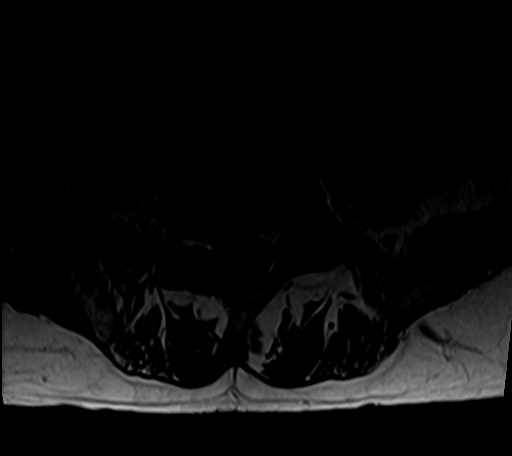
[im 16/55]
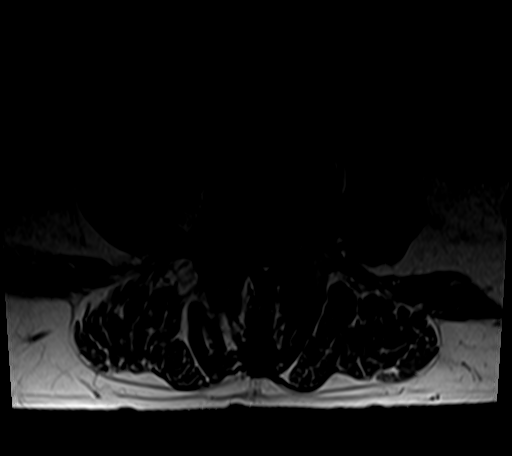
[im 28/55]
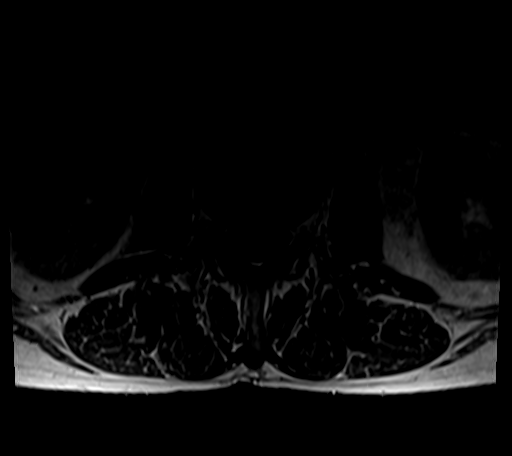
[im 47/55]
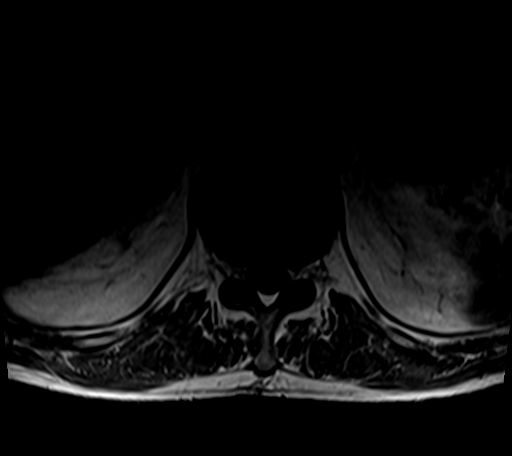

[26 of 48 positions shown; findings below may reference images not displayed]

FINDINGS: Segmentation:  Standard.N

Alignment:  Physiologic.

Vertebrae:  No fracture, evidence of discitis, or bone lesion.

Conus medullaris and cauda equina: Conus extends to the L2 level.
Conus and cauda equina appear normal.

Paraspinal and other soft tissues: Negative.

Disc spaces:

T12-L1: No significant disc bulge. No neural foraminal stenosis. No
central canal stenosis.

L1-L2: No significant disc bulge. No neural foraminal stenosis. No
central canal stenosis. Facet joint arthropathy.

L2-L3: Circumferential disc protrusion and ligamentum flavum
hypertrophy without significant spinal canal narrowing. Mild
narrowing of lateral recesses bilaterally. No significant neural
foraminal narrowing.

L3-L4: Circumferential disc protrusion and ligamentum flavum
hypertrophy with mild narrowing of spinal canal. Narrowing of
lateral recesses bilaterally. No significant neural foraminal
narrowing.

L4-L5: Moderate disc protrusion and ligamentum flavum hypertrophy
with moderate narrowing of spinal canal. Mild left and moderate
right neural foraminal narrowing.

L5-S1: Circumferential disc protrusion and ligamentum flavum
hypertrophy. Bilateral facet joint arthropathy. Mild narrowing of
left neural foramen.
IMPRESSION: 1.  No evidence of fracture or subluxation.

2. Degenerate disc disease of the lumbar spine prominent at L4-L5
and L5-S1, as detailed above.

## 2022-01-09 ENCOUNTER — Encounter: Payer: Self-pay | Admitting: Physical Medicine and Rehabilitation

## 2022-01-09 ENCOUNTER — Ambulatory Visit (INDEPENDENT_AMBULATORY_CARE_PROVIDER_SITE_OTHER): Payer: Medicare Other | Admitting: Physical Medicine and Rehabilitation

## 2022-01-09 VITALS — BP 145/77 | HR 48

## 2022-01-09 DIAGNOSIS — M47816 Spondylosis without myelopathy or radiculopathy, lumbar region: Secondary | ICD-10-CM

## 2022-01-09 DIAGNOSIS — M5441 Lumbago with sciatica, right side: Secondary | ICD-10-CM | POA: Diagnosis not present

## 2022-01-09 DIAGNOSIS — M5416 Radiculopathy, lumbar region: Secondary | ICD-10-CM | POA: Diagnosis not present

## 2022-01-09 DIAGNOSIS — G8929 Other chronic pain: Secondary | ICD-10-CM

## 2022-01-09 DIAGNOSIS — M48061 Spinal stenosis, lumbar region without neurogenic claudication: Secondary | ICD-10-CM

## 2022-01-09 NOTE — Progress Notes (Signed)
Pt state lower back pain that travels to his right hip. Pt state bending makes the pain worse. Pt state he takes pain meds to help ease his pain.  Numeric Pain Rating Scale and Functional Assessment Average Pain 9 Pain Right Now 7 My pain is constant, dull, and aching Pain is worse with: bending and some activites Pain improves with: medication   In the last MONTH (on 0-10 scale) has pain interfered with the following?  1. General activity like being  able to carry out your everyday physical activities such as walking, climbing stairs, carrying groceries, or moving a chair?  Rating(7)  2. Relation with others like being able to carry out your usual social activities and roles such as  activities at home, at work and in your community. Rating(8)  3. Enjoyment of life such that you have  been bothered by emotional problems such as feeling anxious, depressed or irritable?  Rating(9)

## 2022-01-09 NOTE — Progress Notes (Signed)
Leroy LaudJoseph T Maser - 76 y.o. male MRN [id number]  Date of birth: 12/25/1945  Office Visit Note: Visit Date: 01/09/2022 PCP: Richmond CampbellKaplan, Kristen W., PA-C Referred by: Richmond CampbellKaplan, Kristen W., PA-C  Subjective: Chief Complaint  Patient presents with   Lower Back - Pain   Right Hip - Pain   HPI: Leroy Lara is a 76 y.o. male who comes in today per the request of Barnie DelErin Zamora, NP for evaluation of chronic intermittent bilateral lower back pain radiating to right lateral thigh. Patient reports pain has been ongoing for several months. Patient states pain is exacerbated by movement and activity, describes as a soreness sensation, currently rates as 4 out of 10. Patient reports some relief of pain with home exercise regimen, rest and use of medications. Patient has taken Prednisone in the past with some pain relief. Patient has not attended formal physical therapy/chiropractor for his chronic back issues. Patients recent lumbar MRI exhibits multi-level facet hypertrophy and moderate spinal canal stenosis at L4-L5. No high grade spinal canal stenosis noted. Patient states he is a very active person and walking several miles during the week for exercise. Patient states his pain is not severe at this time but he does have questions about possible treatments and would like to discuss his lumbar MRI. Patient denies focal weakness, numbness and tingling. Patient denies recent trauma or falls.   Review of Systems  Musculoskeletal:  Positive for back pain.  Neurological:  Negative for tingling, sensory change, focal weakness and weakness.  All other systems reviewed and are negative. Otherwise per HPI.  Assessment & Plan: Visit Diagnoses:    ICD-10-CM   1. Lumbar radiculopathy  M54.16     2. Chronic right-sided low back pain with right-sided sciatica  M54.41    G89.29     3. Spinal stenosis of lumbar region without neurogenic claudication  M48.061     4. Facet hypertrophy of lumbar region  M47.816         Plan: Findings:  Chronic intermittent bilateral lower back pain radiating to right lateral thigh. Patients continues to have severe pain despite good conservative therapies such as home exercise program, rest and use of medications. Patients clinical presentation and exam are consistent with neurogenic claudication as a result of spinal canal stenosis.  We did discuss patient's recent lumbar MRI with him today using images and spine model.  We feel the next step is to perform a diagnostic and hopefully therapeutic right L4 transforaminal epidural steroid injection under fluoroscopic guidance.  We discussed the procedure regarding lumbar epidural steroid injection today in detail.  Patient states he feels like his pain is manageable at home right now and does not consider his discomfort to be severe.  Patient states he would like to hold off on epidural steroid injection at this time and will let us know if his pain increases or changes in nature.  Patient encouraged to remain active and to continue home exercise regimen as tolerated.  Patient will follow-up with us as needed.  No red flag symptoms noted upon exam today.   Meds & Orders: No orders of the defined types were placed in this encounter.  No orders of the defined types were placed in this encounter.   Follow-up: Return if symptoms worsen or fail to improve.   Procedures: No procedures performed      Clinical History: EXAM: MRI LUMBAR SPINE WITHOUT CONTRAST   TECHNIQUE: Multiplanar, multisequence MR imaging of the lumbar spine was performed. No intravenous  contrast was administered.   COMPARISON:  MR lumbar 06/16/2014; X-ray lumbar 03/21/2021.   FINDINGS: Segmentation:  Standard.N   Alignment:  Physiologic.   Vertebrae:  No fracture, evidence of discitis, or bone lesion.   Conus medullaris and cauda equina: Conus extends to the L2 level. Conus and cauda equina appear normal.   Paraspinal and other soft tissues: Negative.    Disc spaces:   T12-L1: No significant disc bulge. No neural foraminal stenosis. No central canal stenosis.   L1-L2: No significant disc bulge. No neural foraminal stenosis. No central canal stenosis. Facet joint arthropathy.   L2-L3: Circumferential disc protrusion and ligamentum flavum hypertrophy without significant spinal canal narrowing. Mild narrowing of lateral recesses bilaterally. No significant neural foraminal narrowing.   L3-L4: Circumferential disc protrusion and ligamentum flavum hypertrophy with mild narrowing of spinal canal. Narrowing of lateral recesses bilaterally. No significant neural foraminal narrowing.   L4-L5: Moderate disc protrusion and ligamentum flavum hypertrophy with moderate narrowing of spinal canal. Mild left and moderate right neural foraminal narrowing.   L5-S1: Circumferential disc protrusion and ligamentum flavum hypertrophy. Bilateral facet joint arthropathy. Mild narrowing of left neural foramen.   IMPRESSION: 1.  No evidence of fracture or subluxation.   2. 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     Electronically Signed   By: Larose Hires D.O.   On: 01/09/2022 11:20   He reports that he has quit smoking. He has never used smokeless tobacco. No results for input(s): HGBA1C, LABURIC in the last 8760 hours.  Objective:  VS:  HT:     WT:    BMI:      BP:(!) 145/77   HR:(!) 48bpm   TEMP: ( )   RESP:  Physical Exam Vitals and nursing note reviewed.  HENT:     Head: Normocephalic and atraumatic.     Right Ear: External ear normal.     Left Ear: External ear normal.     Nose: Nose normal.     Mouth/Throat:     Mouth: Mucous membranes are moist.  Eyes:     Extraocular Movements: Extraocular movements intact.  Cardiovascular:     Rate and Rhythm: Normal rate.     Pulses: Normal pulses.  Pulmonary:     Effort: Pulmonary effort is normal.  Abdominal:     General: Abdomen is  flat. There is no distension.  Musculoskeletal:        General: Tenderness present.     Cervical back: Normal range of motion.     Comments: Pt rises from seated position to standing without difficulty. Good lumbar range of motion. Strong distal strength without clonus, no pain upon palpation of greater trochanters. No pain noted with internal/external rotation of bilateral hips. Sensation intact bilaterally. Walks independently, gait steady.   Skin:    General: Skin is warm and dry.     Capillary Refill: Capillary refill takes less than 2 seconds.  Neurological:     General: No focal deficit present.     Mental Status: He is alert and oriented to person, place, and time.  Psychiatric:        Mood and Affect: Mood normal.    Ortho Exam  Imaging: No results found.  Past Medical/Family/Surgical/Social History: Medications & Allergies reviewed per EMR, new medications updated. Patient Active Problem List   Diagnosis Date Noted   Carpal tunnel syndrome of left wrist 06/16/2018   Right hip pain 01/29/2017  Left hip pain 01/29/2017   Abnormal EKG 01/28/2012   Hypertension, benign 01/28/2012   Right carotid bruit 01/28/2012   Past Medical History:  Diagnosis Date   AAA (abdominal aortic aneurysm)    Atrial fibrillation, permanent (HCC)    Carotid artery calcification, bilateral    Essential hypertension    Gastroesophageal reflux disease    Family History  Problem Relation Age of Onset   Coronary artery disease Mother 74       myocardial infarction   Cancer - Lung Father    Heart failure Sister    Pulmonary fibrosis Brother    Lymphoma Brother 37   History reviewed. No pertinent surgical history. Social History   Occupational History   Not on file  Tobacco Use   Smoking status: Former   Smokeless tobacco: Never  Building services engineer Use: Never used  Substance and Sexual Activity   Alcohol use: Never   Drug use: Never   Sexual activity: Not on file

## 2022-02-21 ENCOUNTER — Other Ambulatory Visit (HOSPITAL_COMMUNITY): Payer: Self-pay | Admitting: Cardiovascular Disease

## 2022-02-21 DIAGNOSIS — I6523 Occlusion and stenosis of bilateral carotid arteries: Secondary | ICD-10-CM

## 2022-03-07 ENCOUNTER — Ambulatory Visit (HOSPITAL_COMMUNITY): Admission: RE | Admit: 2022-03-07 | Payer: Medicare Other | Source: Ambulatory Visit

## 2022-03-08 ENCOUNTER — Ambulatory Visit (HOSPITAL_COMMUNITY)
Admission: RE | Admit: 2022-03-08 | Discharge: 2022-03-08 | Disposition: A | Discharge status: Home / Self Care | Payer: Medicare Other | Source: Ambulatory Visit | Attending: Cardiology | Admitting: Cardiology

## 2022-03-08 DIAGNOSIS — I6523 Occlusion and stenosis of bilateral carotid arteries: Secondary | ICD-10-CM | POA: Insufficient documentation

## 2022-03-26 ENCOUNTER — Encounter: Payer: Self-pay | Admitting: Cardiovascular Disease

## 2022-03-26 ENCOUNTER — Other Ambulatory Visit: Payer: Self-pay

## 2022-03-26 MED ORDER — ROSUVASTATIN CALCIUM 20 MG PO TABS: 20.0000 mg | ORAL_TABLET | Freq: Every day | ORAL | 0 refills | Status: DC

## 2022-03-26 MED ORDER — HYDROCHLOROTHIAZIDE 25 MG PO TABS: 25.0000 mg | ORAL_TABLET | Freq: Every day | ORAL | 3 refills | Status: DC

## 2022-03-26 MED ORDER — LOSARTAN POTASSIUM 100 MG PO TABS: 100.0000 mg | ORAL_TABLET | Freq: Every day | ORAL | 0 refills | Status: DC

## 2022-03-29 ENCOUNTER — Telehealth: Payer: Self-pay | Admitting: Physician Assistant

## 2022-03-29 ENCOUNTER — Other Ambulatory Visit: Payer: Medicare Other

## 2022-03-29 ENCOUNTER — Telehealth: Payer: Self-pay | Admitting: *Deleted

## 2022-03-29 DIAGNOSIS — I10 Essential (primary) hypertension: Secondary | ICD-10-CM

## 2022-03-29 DIAGNOSIS — Z01818 Encounter for other preprocedural examination: Secondary | ICD-10-CM

## 2022-03-29 DIAGNOSIS — I4819 Other persistent atrial fibrillation: Secondary | ICD-10-CM

## 2022-03-29 DIAGNOSIS — I2510 Atherosclerotic heart disease of native coronary artery without angina pectoris: Secondary | ICD-10-CM

## 2022-03-29 DIAGNOSIS — I714 Abdominal aortic aneurysm, without rupture, unspecified: Secondary | ICD-10-CM

## 2022-03-29 NOTE — Telephone Encounter (Signed)
I s/w the pt in regard to he will need lab work today for pre op clearance, CBC, BMET. Pt tells me he cannot come in today for lab work, said he can come in tomorrow morning. Pt tells me he just had labs a couple of weeks ago with [facility]. I stated that I will call [facility] and see if they happen to draw CBC, BMET, if not he will need to come in tomorrow. I explained to the pt that we have just been asked today for clearance, which is why the needing labs ASAP for procedure 04/03/22.  ? ?I s/w [facility] and confirmed the only lab work was a PSA.  ? ?I then called the pt back and stated he will need to come in tomorrow for the lab work for pre op. Pt agreeable to plan of care. Pt aware lab opens 7:30 am  ? ?I did wish the pt HAPPY BIRTHDAY!!! ?

## 2022-03-29 NOTE — Telephone Encounter (Signed)
? ?  Pre-operative Risk Assessment  ?  ?Patient Name: Leroy Lara  ?DOB: 08/01/1946 ?MRN: [id number]  ? ?  ? ?Request for Surgical Clearance   ? ?Procedure:   COLONOSCOPY ? ?Date of Surgery:  Clearance 04/03/22                              ?   ?Surgeon:  DR. Dellis Filbert MEDOFF ?Surgeon's Group or Practice Name:  [facility] ?Phone number:  [phone] ?Fax number:  [phone] ?  ?Type of Clearance Requested:   ?- Medical  ?- Pharmacy:  Hold Apixaban (Eliquis)   ?  ?Type of Anesthesia:   PROPOFOL ?  ?Additional requests/questions:   ? ?Signed, ?Julaine Hua   ?03/29/2022, 10:35 AM  ? ?

## 2022-03-29 NOTE — Telephone Encounter (Signed)
Patient with diagnosis of A Fib on Eliquis for anticoagulation.   ? ?Procedure: colonoscopy ?Date of procedure: 04/03/22 ? ? ?CHA2DS2-VASc Score = 4  ?This indicates a 4.8% annual risk of stroke. ?The patient's score is based upon: ?CHF History: 0 ?HTN History: 1 ?Diabetes History: 0 ?Stroke History: 0 ?Vascular Disease History: 1 ?Age Score: 2 ?Gender Score: 0 ?  ?CrCl overdue for labs ?Platelet count overdue for labs ? ? ?Per office protocol, patient can hold Eliquis for 2 days prior to procedure.   ?

## 2022-03-29 NOTE — Telephone Encounter (Signed)
Last Ov >1 year ago but colonoscopy is only days away and has f/u 04/2022 so will review with Dr. Burt Knack. Also looks like AAA duplex was recommended at last OV but has echo scheduled instead. Will route to pharm and await Dr. Antionette Char input (sent via secure chat). ?

## 2022-03-29 NOTE — Telephone Encounter (Signed)
Hi Leroy Lara. I am working preop today and reviewed this patient's last OV. We are addressing his preop colonoscopy in a separate phone note. However, need your help setting up [place] of aorta to f/u this pt's AAA. At last appt in 02/2021 Dr. Excell Seltzer appears to have intended to order this to follow up his AAA it but got entered as a 2d echo (that is scheduled for 4/27). Dr. Excell Seltzer recommends to keep the echo but does recommend to try to get the AAA duplex before his OV in 04/2022. Can you help? Thanks! ?

## 2022-03-29 NOTE — Telephone Encounter (Signed)
Can we please see if we can get Leroy Lara in for ASAP labs today? CBC, BMET please. For pre colonoscopy clearance. If he has had them done elsewhere let us know values but nothing in [facility] or [facility]. ?

## 2022-03-29 NOTE — Telephone Encounter (Signed)
? ?  Patient Name: Leroy Lara  ?DOB: [birth year] ?MRN: [id number] ? ?Primary Cardiologist: Sherren Mocha, MD ? ?Chart reviewed as part of pre-operative protocol coverage. Reviewed with Dr. Burt Knack who is comfortable clearing patient for colonoscopy even if slightly outside the 1 year window. I reached out to patient for update on how he is doing. The patient affirms he has been doing well without any new cardiac symptoms. Therefore, the patient would be at acceptable risk for the planned procedure without further cardiovascular testing. The patient was advised that if he develops new symptoms prior to surgery to contact our office to arrange for a follow-up visit, and he verbalized understanding. ? ?As below, we are waiting on labs being drawn tomorrow before finalizing how long patient should hold Eliquis prior to procedure (asked callback to make them stat) - have sent a sign out message to APP working tomorrow to be aware to review, as patient will need to begin holding Eliquis as of Sunday if labs are OK. I will fax this msg to requesting GI team to let them know final clearance will be relayed tomorrow. ? ?Unrelated to clearance above, Dr. Burt Knack did clarify he intended pt to have AAA duplex prior to upcoming OV but states can keep echo as routine f/u for afib. Will send in separate non-preop phone note to Dr. Antionette Char nurse. Pt aware office would call him for this. ? ?Charlie Pitter, PA-C ?03/29/2022, 12:46 PM ? ? ?

## 2022-03-30 ENCOUNTER — Other Ambulatory Visit: Payer: Medicare Other | Admitting: *Deleted

## 2022-03-30 DIAGNOSIS — I10 Essential (primary) hypertension: Secondary | ICD-10-CM

## 2022-03-30 DIAGNOSIS — Z01818 Encounter for other preprocedural examination: Secondary | ICD-10-CM

## 2022-03-30 DIAGNOSIS — I2510 Atherosclerotic heart disease of native coronary artery without angina pectoris: Secondary | ICD-10-CM

## 2022-03-30 DIAGNOSIS — I4819 Other persistent atrial fibrillation: Secondary | ICD-10-CM

## 2022-03-30 LAB — BASIC METABOLIC PANEL
BUN/Creatinine Ratio: 14 (ref 10–24)
BUN: 14 mg/dL (ref 8–27)
CO2: 28 mmol/L (ref 20–29)
Calcium: 8.7 mg/dL (ref 8.6–10.2)
Chloride: 103 mmol/L (ref 96–106)
Creatinine, Ser: 1.03 mg/dL (ref 0.76–1.27)
Glucose: 93 mg/dL (ref 70–99)
Potassium: 4 mmol/L (ref 3.5–5.2)
Sodium: 137 mmol/L (ref 134–144)
eGFR: 75 mL/min/{1.73_m2} (ref >59)

## 2022-03-30 LAB — CBC
Hematocrit: 39.5 % (ref 37.5–51.0)
Hemoglobin: 13.1 g/dL (ref 13.0–17.7)
MCH: 32.6 pg (ref 26.6–33.0)
MCHC: 33.2 g/dL (ref 31.5–35.7)
MCV: 98 fL — ABNORMAL HIGH (ref 79–97)
Platelets: 198 10*3/uL (ref 150–450)
RBC: 4.02 x10E6/uL — ABNORMAL LOW (ref 4.14–5.80)
RDW: 12.1 % (ref 11.6–15.4)
WBC: 7 10*3/uL (ref 3.4–10.8)

## 2022-03-30 NOTE — Telephone Encounter (Signed)
Patient with diagnosis of A Fib on Eliquis for anticoagulation.   ?  ?Procedure: colonoscopy ?Date of procedure: 04/03/22 ?  ?  ?CHA2DS2-VASc Score = 4  ?This indicates a 4.8% annual risk of stroke. ?The patient's score is based upon: ?CHF History: 0 ?HTN History: 1 ?Diabetes History: 0 ?Stroke History: 0 ?Vascular Disease History: 1 ?Age Score: 2 ?Gender Score: 0 ?  ?CrCl overdue for labs ?Platelet count: 198K ?  ?  ?Per office protocol, patient can hold Eliquis for 2 days prior to procedure.   ?

## 2022-03-30 NOTE — Telephone Encounter (Signed)
? ?  Primary Cardiologist: [name], MD ? ?Chart reviewed as part of pre-operative protocol coverage. Given past medical history and time since last visit, based on ACC/AHA guidelines, AWAD GLADD would be at acceptable risk for the planned procedure without further cardiovascular testing.  ? ?Patient with diagnosis of A Fib on Eliquis for anticoagulation.   ?  ?Procedure: colonoscopy ?Date of procedure: 04/03/22 ?  ?  ?CHA2DS2-VASc Score = 4  ?This indicates a 4.8% annual risk of stroke. ?The patient's score is based upon: ?CHF History: 0 ?HTN History: 1 ?Diabetes History: 0 ?Stroke History: 0 ?Vascular Disease History: 1 ?Age Score: 2 ?Gender Score: 0 ?  ?CrCl overdue for labs ?Platelet count: 198K ?  ?  ?Per office protocol, patient can hold Eliquis for 2 days prior to procedure. ? ?I will route this recommendation to the requesting party via Epic fax function and remove from pre-op pool. ? ?Please call with questions. ? ?Thomasene Ripple. Keasia Dubose NP-C ? ?  ?03/30/2022, 1:26 PM ?[facility] ?[address] ?Office [phone] Fax [phone] ? ? ? ? ?

## 2022-03-30 NOTE — Telephone Encounter (Signed)
Left detailed message for the pt that he has been cleared for his procedure with Dr. Kinnie Scales 04/03/22. Pt will need to hold his Eliquis x 2 days prior to procedure. If any questions he can call our office or may reach out to Dr. Jennye Boroughs office as well. I will fax these clearance notes today.  ?

## 2022-03-30 NOTE — Telephone Encounter (Signed)
AAA duplex ordered at this time. Will route to [facility] to see if this can be done prior to 04/20/22 visit with Dr Burt Knack. ?

## 2022-03-30 NOTE — Telephone Encounter (Signed)
Preoperative team, please contact patient and let him know that his lab work has been reviewed.  His preoperative cardiac evaluation has been sent back to the requesting office.  Pharmacy has reviewed his labs as well and recommended that his Eliquis be held for 2 days prior to his procedure. ? ?Thank you for your help, ? ?Jossie Ng. Abeer Iversen NP-C ? ?  ?03/30/2022, 2:08 PM ?[place] ?[address] ?Office [phone] Fax [phone] ? ?

## 2022-04-02 ENCOUNTER — Other Ambulatory Visit: Payer: Self-pay | Admitting: *Deleted

## 2022-04-02 MED ORDER — ROSUVASTATIN CALCIUM 20 MG PO TABS: 20.0000 mg | ORAL_TABLET | Freq: Every day | ORAL | 0 refills | Status: DC

## 2022-04-02 MED ORDER — HYDROCHLOROTHIAZIDE 25 MG PO TABS: 25.0000 mg | ORAL_TABLET | Freq: Every day | ORAL | 0 refills | Status: DC

## 2022-04-02 MED ORDER — LOSARTAN POTASSIUM 100 MG PO TABS
100.0000 mg | ORAL_TABLET | Freq: Every day | ORAL | 0 refills | Status: DC
Start: 2022-04-02 — End: 2022-08-17

## 2022-04-05 ENCOUNTER — Ambulatory Visit (HOSPITAL_COMMUNITY): Payer: Medicare Other | Attending: Cardiology

## 2022-04-05 DIAGNOSIS — I4819 Other persistent atrial fibrillation: Secondary | ICD-10-CM | POA: Diagnosis present

## 2022-04-05 LAB — ECHOCARDIOGRAM COMPLETE: S' Lateral: 3.8 cm

## 2022-04-12 ENCOUNTER — Ambulatory Visit (HOSPITAL_COMMUNITY)
Admission: RE | Admit: 2022-04-12 | Discharge: 2022-04-12 | Disposition: A | Discharge status: Home / Self Care | Payer: Medicare Other | Source: Ambulatory Visit | Attending: Cardiovascular Disease | Admitting: Cardiovascular Disease

## 2022-04-12 DIAGNOSIS — I714 Abdominal aortic aneurysm, without rupture, unspecified: Secondary | ICD-10-CM | POA: Diagnosis not present

## 2022-04-12 DIAGNOSIS — I7143 Infrarenal abdominal aortic aneurysm, without rupture: Secondary | ICD-10-CM

## 2022-04-20 ENCOUNTER — Ambulatory Visit (INDEPENDENT_AMBULATORY_CARE_PROVIDER_SITE_OTHER): Payer: Medicare Other | Admitting: Cardiovascular Disease

## 2022-04-20 ENCOUNTER — Encounter: Payer: Self-pay | Admitting: Cardiovascular Disease

## 2022-04-20 VITALS — BP 130/80 | HR 46 | Ht 72.0 in | Wt 186.2 lb

## 2022-04-20 DIAGNOSIS — E782 Mixed hyperlipidemia: Secondary | ICD-10-CM

## 2022-04-20 DIAGNOSIS — I4819 Other persistent atrial fibrillation: Secondary | ICD-10-CM | POA: Diagnosis not present

## 2022-04-20 DIAGNOSIS — I2510 Atherosclerotic heart disease of native coronary artery without angina pectoris: Secondary | ICD-10-CM | POA: Diagnosis not present

## 2022-04-20 DIAGNOSIS — I6523 Occlusion and stenosis of bilateral carotid arteries: Secondary | ICD-10-CM

## 2022-04-20 DIAGNOSIS — I714 Abdominal aortic aneurysm, without rupture, unspecified: Secondary | ICD-10-CM | POA: Diagnosis not present

## 2022-04-20 DIAGNOSIS — Z79899 Other long term (current) drug therapy: Secondary | ICD-10-CM

## 2022-04-20 DIAGNOSIS — I10 Essential (primary) hypertension: Secondary | ICD-10-CM | POA: Diagnosis not present

## 2022-04-20 MED ORDER — HYDROCHLOROTHIAZIDE 12.5 MG PO TABS: 12.5000 mg | ORAL_TABLET | Freq: Every day | ORAL | 3 refills | Status: DC

## 2022-04-20 NOTE — Patient Instructions (Signed)
Medication Instructions:  ?DECREASE Hydrochlorothiazide to 12.5mg  daily ?*If you need a refill on your cardiac medications before your next appointment, please call your pharmacy* ? ? ?Lab Work: ?BMET prior to next appt ?If you have labs (blood work) drawn today and your tests are completely normal, you will receive your results only by: ?MyChart Message (if you have MyChart) OR ?A paper copy in the mail ?If you have any lab test that is abnormal or we need to change your treatment, we will call you to review the results. ? ? ?Testing/Procedures: ?NONE ? ? ?Follow-Up: ?At [facility], you and your health needs are our priority.  As part of our continuing mission to provide you with exceptional heart care, we have created designated Provider Care Teams.  These Care Teams include your primary Cardiologist (physician) and Advanced Practice Providers (APPs -  Physician Assistants and Nurse Practitioners) who all work together to provide you with the care you need, when you need it.  ? ?Your next appointment:   ?6 month(s) ? ?The format for your next appointment:   ?In Person ? ?Provider:   ?Suzzanne Cloud, or Bhagat, then Dr Excell Seltzer in 1 year ? ?Important Information About Sugar ? ? ? ? ?  ?

## 2022-04-20 NOTE — Progress Notes (Signed)
?Cardiology Office Note:   ? ?Date:  04/25/2022  ? ?ID:  Leroy Lara, DOB 03/14/1946, MRN [id number] ? ?PCP:  Aletha Halim., PA-C ?  ?[facility] Providers ?Cardiologist:  Sherren Mocha, MD    ? ?Referring MD: Aletha Halim., PA-C  ? ?Chief Complaint  ?Patient presents with  ? Atrial Fibrillation  ? ? ?History of Present Illness:   ? ?Leroy Lara is a 76 y.o. male with a hx of  permanent atrial fibrillation with slow ventricular rate, hypertension, abdominal aortic aneurysm, and mild anemia nonobstructive carotid stenosis, presenting for follow-up evaluation today.  The patient is here alone. ? ?He has been doing okay with respect to his own health.  His wife has had some medical problems and this has been stressful for him.  He denies any symptoms of chest pain, chest pressure, or shortness of breath.  We reviewed his studies today in detail.  He has not had any recent problems with lightheadedness, heart palpitations, or syncope. ? ?Past Medical History:  ?Diagnosis Date  ? AAA (abdominal aortic aneurysm) ([place])   ? Atrial fibrillation, permanent ([place])   ? Carotid artery calcification, bilateral   ? Essential hypertension   ? Gastroesophageal reflux disease   ? ? ?History reviewed. No pertinent surgical history. ? ?Current Medications: ?Current Meds  ?Medication Sig  ? apixaban (ELIQUIS) 5 MG TABS tablet Take 1 tablet (5 mg total) by mouth 2 (two) times daily.  ? clobetasol ointment (TEMOVATE) 0.05 %   ? fluticasone (FLONASE) 50 MCG/ACT nasal spray as needed.  ? hydrochlorothiazide (HYDRODIURIL) 12.5 MG tablet Take 1 tablet (12.5 mg total) by mouth daily.  ? losartan (COZAAR) 100 MG tablet Take 1 tablet (100 mg total) by mouth daily. Please keep upcoming appt. With Dr Burt Knack in May to receive further refills. Thank You.  ? omeprazole (PRILOSEC) 20 MG capsule Take 20 mg by mouth daily.  ? OXISTAT 1 % lotion as needed.  ? rosuvastatin (CRESTOR) 20 MG tablet Take 1 tablet (20 mg total) by mouth daily.  Please keep upcoming appt. With Dr.Olyvia Gopal in May in order to receive further refills. Thank You.  ? [DISCONTINUED] hydrochlorothiazide (HYDRODIURIL) 25 MG tablet Take 1 tablet (25 mg total) by mouth daily. Please make yearly appt with Dr. Burt Knack for March 2023 for future refills. Thank you 1st attempt  ?  ? ?Allergies:   Sulfamethoxazole, Sulfasalazine, and Sulfonamide derivatives  ? ?Social History  ? ?Socioeconomic History  ? Marital status: Married  ?  Spouse name: Not on file  ? Number of children: Not on file  ? Years of education: Not on file  ? Highest education level: Not on file  ?Occupational History  ? Not on file  ?Tobacco Use  ? Smoking status: Former  ? Smokeless tobacco: Never  ?Vaping Use  ? Vaping Use: Never used  ?Substance and Sexual Activity  ? Alcohol use: Never  ? Drug use: Never  ? Sexual activity: Not on file  ?Other Topics Concern  ? Not on file  ?Social History Narrative  ? Not on file  ? ?Social Determinants of Health  ? ?Financial Resource Strain: Not on file  ?Food Insecurity: Not on file  ?Transportation Needs: Not on file  ?Physical Activity: Not on file  ?Stress: Not on file  ?Social Connections: Not on file  ?  ? ?Family History: ?The patient's family history includes Cancer - Lung in his father; Coronary artery disease (age of onset: 55) in his mother;  Heart failure in his sister; Lymphoma (age of onset: 30) in his brother; Pulmonary fibrosis in his brother. ? ?ROS:   ?Please see the history of present illness.    ?All other systems reviewed and are negative. ? ?EKGs/Labs/Other Studies Reviewed:   ? ?The following studies were reviewed today: ?Coronary CTA 09-23-2019: ?IMPRESSION: ?1. Coronary artery calcium score 546 Agatston units. This places the ?patient in the 70th percentile for age and gender, suggesting ?intermediate risk for future cardiac events. ?  ?2.  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 ?  ?3.  Possible moderate mid LAD stenosis.  Will send for FFR. ? ?Echo 04/05/22: ? 1.  Left ventricular ejection fraction, by estimation, is 50 to 55%. The  ?left ventricle has low normal function. The left ventricle has no regional  ?wall motion abnormalities. Left ventricular diastolic parameters are  ?indeterminate.  ? 2. Right ventricular systolic function is normal. The right ventricular  ?size is normal. There is mildly elevated pulmonary artery systolic  ?pressure. The estimated right ventricular systolic pressure is 123XX123 mmHg.  ? 3. Left atrial size was moderately dilated.  ? 4. Right atrial size was moderately dilated.  ? 5. The mitral valve is normal in structure. Mild mitral valve  ?regurgitation. No evidence of mitral stenosis.  ? 6. The aortic valve is tricuspid. There is mild calcification of the  ?aortic valve. Aortic valve regurgitation is trivial. No aortic stenosis is  ?present.  ? 7. The inferior vena cava is dilated in size with >50% respiratory  ?variability, suggesting right atrial pressure of 8 mmHg.  ? ?Comparison(s): 01/06/20 EF 60-65%.  ? ?Abdominal US - AAA: ?Summary:  ?Abdominal Aorta: There is evidence of abnormal dilatation of the distal  ?Abdominal aorta. The largest aortic measurement is 2.9 cm. The largest  ?aortic diameter remains essentially unchanged compared to prior exam.  ?Previous diameter measurement was 3.0 cm  ?obtained on 01/2020.  ?IVC/Iliac: There is no evidence of thrombus involving the IVC. ? ?Carotid US: ?Summary:  ?Right Carotid: Velocities in the right ICA are consistent with a 1-39%  ?stenosis.  ? ?Left Carotid: Velocities in the left ICA are consistent with a 1-39%  ?stenosis.  ?              Non-hemodynamically significant plaque <50% noted in the  ?CCA.  ? ?Vertebrals:  Bilateral vertebral arteries demonstrate antegrade flow.  ?Subclavians: Normal flow hemodynamics were seen in bilateral subclavian  ?             arteries.  ? ?EKG:  EKG is ordered today.  The ekg ordered today demonstrates atrial fibrillation with slow ventricular response, HR 46  bpm, LBBB, no significant change ? ?Recent Labs: ?03/30/2022: BUN 14; Creatinine, Ser 1.03; Hemoglobin 13.1; Platelets 198; Potassium 4.0; Sodium 137  ?Recent Lipid Panel ?   ?Component Value Date/Time  ? CHOL 125 02/20/2021 0824  ? TRIG 84 02/20/2021 0824  ? HDL 45 02/20/2021 0824  ? CHOLHDL 2.8 02/20/2021 0824  ? CHOLHDL 3.0 01/10/2016 0759  ? VLDL 19 01/10/2016 0759  ? [place] 64 02/20/2021 0824  ? LDLDIRECT 154.8 08/26/2013 0739  ? ? ? ?Risk Assessment/Calculations:   ? ?CHA2DS2-VASc Score = 4  ? This indicates a 4.8% annual risk of stroke. ?The patient's score is based upon: ?CHF History: 0 ?HTN History: 1 ?Diabetes History: 0 ?Stroke History: 0 ?Vascular Disease History: 1 ?Age Score: 2 ?Gender Score: 0 ?  ? ? ?    ? ?Physical Exam:   ? ?  VS:  BP 130/80   Pulse (!) 46   Ht 6' (1.829 m)   Wt 186 lb 3.2 oz (84.5 kg)   SpO2 96%   BMI 25.25 kg/m?    ? ?Wt Readings from Last 3 Encounters:  ?04/20/22 186 lb 3.2 oz (84.5 kg)  ?03/03/21 196 lb (88.9 kg)  ?08/17/20 194 lb 3.2 oz (88.1 kg)  ?  ? ?GEN:  Well nourished, well developed in no acute distress ?HEENT: Normal ?NECK: No JVD; No carotid bruits ?LYMPHATICS: No lymphadenopathy ?CARDIAC: Bradycardia, irregular, no murmurs, rubs, gallops ?RESPIRATORY:  Clear to auscultation without rales, wheezing or rhonchi  ?ABDOMEN: Soft, non-tender, non-distended ?MUSCULOSKELETAL:  No edema; No deformity  ?SKIN: Warm and dry ?NEUROLOGIC:  Alert and oriented x 3 ?PSYCHIATRIC:  Normal affect  ? ?ASSESSMENT:   ? ?1. Persistent atrial fibrillation ([place])   ?2. Essential hypertension   ?3. Abdominal aortic aneurysm (AAA) without rupture, unspecified part ([place])   ?4. Coronary artery disease involving native coronary artery of native heart without angina pectoris   ?5. Mixed hyperlipidemia   ?6. Bilateral carotid artery stenosis   ?7. Medication management   ? ?PLAN:   ? ?In order of problems listed above: ? ?The patient has slow atrial fibrillation not associated with any symptoms.   His EKG shows competing junctional pacemaker.  This really is unchanged compared to his last tracing in September 2021.  We will continue to follow.  He is tolerating anticoagulation without bleeding problem

## 2022-06-13 ENCOUNTER — Other Ambulatory Visit: Payer: Self-pay | Admitting: Cardiovascular Disease

## 2022-06-13 DIAGNOSIS — I4891 Unspecified atrial fibrillation: Secondary | ICD-10-CM

## 2022-06-13 NOTE — Telephone Encounter (Signed)
Prescription refill request for Eliquis received. Indication: Atrial Fib Last office visit: 04/20/22  Dayle Points MD Scr: 1.03 on 03/30/22 Age: 76 Weight: 84.5kg  Based on above findings Eliquis 5mg  twice daily is the appropriate dose.  Refill approved.

## 2022-08-17 ENCOUNTER — Other Ambulatory Visit: Payer: Self-pay | Admitting: Cardiovascular Disease

## 2022-09-19 ENCOUNTER — Other Ambulatory Visit: Payer: Self-pay | Admitting: Family Medicine

## 2022-09-19 DIAGNOSIS — Z87891 Personal history of nicotine dependence: Secondary | ICD-10-CM

## 2022-09-20 ENCOUNTER — Encounter: Payer: Self-pay | Admitting: Physical Medicine and Rehabilitation

## 2022-09-20 ENCOUNTER — Other Ambulatory Visit: Payer: Self-pay | Admitting: Physical Medicine and Rehabilitation

## 2022-09-20 DIAGNOSIS — G8929 Other chronic pain: Secondary | ICD-10-CM

## 2022-09-20 DIAGNOSIS — M48061 Spinal stenosis, lumbar region without neurogenic claudication: Secondary | ICD-10-CM

## 2022-09-20 DIAGNOSIS — M5416 Radiculopathy, lumbar region: Secondary | ICD-10-CM

## 2022-09-24 ENCOUNTER — Other Ambulatory Visit: Payer: Self-pay | Admitting: Family Medicine

## 2022-09-24 DIAGNOSIS — R911 Solitary pulmonary nodule: Secondary | ICD-10-CM

## 2022-10-01 ENCOUNTER — Telehealth: Payer: Self-pay | Admitting: Cardiovascular Disease

## 2022-10-01 ENCOUNTER — Telehealth: Payer: Self-pay | Admitting: *Deleted

## 2022-10-01 ENCOUNTER — Encounter (HOSPITAL_COMMUNITY): Payer: Self-pay | Admitting: Anesthesiology

## 2022-10-01 ENCOUNTER — Encounter (HOSPITAL_BASED_OUTPATIENT_CLINIC_OR_DEPARTMENT_OTHER): Payer: Self-pay | Admitting: Otolaryngology

## 2022-10-01 NOTE — Progress Notes (Signed)
   10/01/22 4128  PAT Phone Screen  Do You Have Diabetes? No  Do You Have Hypertension? Yes  Have You Ever Been to the ER for Asthma? No  Have You Taken Oral Steroids in the Past 3 Months? No  Do you Take Phenteramine or any Other Diet Drugs? No  Recent  Lab Work, EKG, CXR? Yes  Where was this test performed? EKG 04/20/22  Do you have a history of heart problems? Yes;Cardiologist  Have You Ever Had Tests on Your Heart? Yes  Where? echo 04/05/22  Any Recent Hospitalizations? No  Height 6' (1.829 m)  Weight 83.9 kg  Pat Appointment Scheduled Yes ([facility])   Reviewed with Dr. Roanna Banning at [facility] okay to proceed with surgery as scheduled

## 2022-10-01 NOTE — Anesthesia Preprocedure Evaluation (Signed)
Anesthesia Evaluation    Reviewed: Allergy & Precautions, Patient's Chart, lab work & pertinent test results  Airway        Dental   Pulmonary neg pulmonary ROS, former smoker,           Cardiovascular hypertension, Pt. on medications + dysrhythmias Atrial Fibrillation   ECHO: Left ventricular ejection fraction, by estimation, is 50 to 55%. The left ventricle has low normal function. The left ventricle has no regional wall motion abnormalities. Left ventricular diastolic parameters are indeterminate. Right ventricular systolic function is normal. The right ventricular size is normal. There is mildly elevated pulmonary artery systolic pressure. The estimated right ventricular systolic pressure is 62.8 mmHg. Left atrial size was moderately dilated. Right atrial size was moderately dilated. The mitral valve is normal in structure. Mild mitral valve regurgitation. No evidence of mitral stenosis. The aortic valve is tricuspid. There is mild calcification of the aortic valve. Aortic valve regurgitation is trivial. No aortic stenosis is present.    Neuro/Psych negative neurological ROS     GI/Hepatic Neg liver ROS, GERD  ,  Endo/Other  negative endocrine ROS  Renal/GU negative Renal ROS     Musculoskeletal negative musculoskeletal ROS (+)   Abdominal   Peds  Hematology  (+) Blood dyscrasia (On Eliquis), ,   Anesthesia Other Findings Lesion of true vocal cord  Reproductive/Obstetrics                             Anesthesia Physical Anesthesia Plan Anesthesia Quick Evaluation

## 2022-10-01 NOTE — Telephone Encounter (Signed)
Pt agreeable to tele pre op appt 10/03/22 @ 2:40 due to procedure date and med hold.   Med rec and consent are done.     Patient Consent for Virtual Visit        Leroy Lara has provided verbal consent on 10/01/2022 for a virtual visit (video or telephone).   CONSENT FOR VIRTUAL VISIT FOR:  Leroy Lara  By participating in this virtual visit I agree to the following:  I hereby voluntarily request, consent and authorize [facility] and its employed or contracted physicians, physician assistants, nurse practitioners or other licensed health care professionals (the Practitioner), to provide me with telemedicine health care services (the "Services") as deemed necessary by the treating Practitioner. I acknowledge and consent to receive the Services by the Practitioner via telemedicine. I understand that the telemedicine visit will involve communicating with the Practitioner through live audiovisual communication technology and the disclosure of certain medical information by electronic transmission. I acknowledge that I have been given the opportunity to request an in-person assessment or other available alternative prior to the telemedicine visit and am voluntarily participating in the telemedicine visit.  I understand that I have the right to withhold or withdraw my consent to the use of telemedicine in the course of my care at any time, without affecting my right to future care or treatment, and that the Practitioner or I may terminate the telemedicine visit at any time. I understand that I have the right to inspect all information obtained and/or recorded in the course of the telemedicine visit and may receive copies of available information for a reasonable fee.  I understand that some of the potential risks of receiving the Services via telemedicine include:  Delay or interruption in medical evaluation due to technological equipment failure or disruption; Information transmitted  may not be sufficient (e.g. poor resolution of images) to allow for appropriate medical decision making by the Practitioner; and/or  In rare instances, security protocols could fail, causing a breach of personal health information.  Furthermore, I acknowledge that it is my responsibility to provide information about my medical history, conditions and care that is complete and accurate to the best of my ability. I acknowledge that Practitioner's advice, recommendations, and/or decision may be based on factors not within their control, such as incomplete or inaccurate data provided by me or distortions of diagnostic images or specimens that may result from electronic transmissions. I understand that the practice of medicine is not an exact science and that Practitioner makes no warranties or guarantees regarding treatment outcomes. I acknowledge that a copy of this consent can be made available to me via my patient portal ([facility]), or I can request a printed copy by calling the office of [facility].    I understand that my insurance will be billed for this visit.   I have read or had this consent read to me. I understand the contents of this consent, which adequately explains the benefits and risks of the Services being provided via telemedicine.  I have been provided ample opportunity to ask questions regarding this consent and the Services and have had my questions answered to my satisfaction. I give my informed consent for the services to be provided through the use of telemedicine in my medical care

## 2022-10-01 NOTE — Telephone Encounter (Signed)
Patient with diagnosis of afib on Eliquis for anticoagulation.    Procedure:  Microlaryngoscopy with an excision of vocal cord lesion Date of procedure: 10/08/22   CHA2DS2-VASc Score = 4   This indicates a 4.8% annual risk of stroke. The patient's score is based upon: CHF History: 0 HTN History: 1 Diabetes History: 0 Stroke History: 0 Vascular Disease History: 1 Age Score: 2 Gender Score: 0      CrCl 69.9 ml/min  Per office protocol, patient can hold Eliquis for 2 days prior to procedure.    Patient should resume within 24-48 hr post procedure as deemed by surgeon.  **This guidance is not considered finalized until pre-operative APP has relayed final recommendations.**

## 2022-10-01 NOTE — Telephone Encounter (Signed)
   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 Izora Gala Surgeon's Group or Practice Name:  [facility] Phone number:  [phone] Fax number:  [phone]   Type of Clearance Requested:   - Medical  - Pharmacy:  Hold Apixaban (Eliquis)     Type of Anesthesia:  General    Additional requests/questions:   Caller would like to know how long to hold patient's Eliquis and when should they resume taking this medication.  Signed, Heloise Beecham   10/01/2022, 12:41 PM

## 2022-10-01 NOTE — Telephone Encounter (Signed)
   Name: Leroy Lara  DOB: 09-21-1946  MRN: [id number]  Primary Cardiologist: Sherren Mocha, MD   Preoperative team, please contact this patient and set up a phone call appointment for further preoperative risk assessment. Please obtain consent and complete medication review. Thank you for your help.  I confirm that guidance regarding antiplatelet and oral anticoagulation therapy has been completed and, if necessary, noted below.    Per office protocol, patient can hold Eliquis for 2 days prior to procedure.     Patient should resume within 24-48 hr post procedure as deemed by surgeon.  Deberah Pelton, NP 10/01/2022, 3:15 PM [facility]

## 2022-10-01 NOTE — Telephone Encounter (Signed)
Pt agreeable to tele pre op appt 10/03/22 @ 2:40 due to procedure date and med hold.   Med rec and consent are done.

## 2022-10-03 ENCOUNTER — Encounter: Payer: Medicare Other | Admitting: Physical Medicine and Rehabilitation

## 2022-10-03 ENCOUNTER — Telehealth: Payer: Self-pay | Admitting: Physical Medicine and Rehabilitation

## 2022-10-03 ENCOUNTER — Encounter (HOSPITAL_BASED_OUTPATIENT_CLINIC_OR_DEPARTMENT_OTHER): Payer: Self-pay | Admitting: Family

## 2022-10-03 ENCOUNTER — Ambulatory Visit (INDEPENDENT_AMBULATORY_CARE_PROVIDER_SITE_OTHER): Payer: Medicare Other | Admitting: Family

## 2022-10-03 DIAGNOSIS — Z01818 Encounter for other preprocedural examination: Secondary | ICD-10-CM | POA: Diagnosis not present

## 2022-10-03 NOTE — Telephone Encounter (Signed)
Patient wants to cx appt today @  2pm due to family emergcy

## 2022-10-03 NOTE — Telephone Encounter (Signed)
Appt cancelled. Tried calling to reschedule, no answer.

## 2022-10-03 NOTE — Progress Notes (Signed)
Virtual Visit via Telephone Note   Because of Leroy Lara's co-morbid illnesses, he is at least at moderate risk for complications without adequate follow up.  This format is felt to be most appropriate for this patient at this time.  The patient did not have access to video technology/had technical difficulties with video requiring transitioning to audio format only (telephone).  All issues noted in this document were discussed and addressed.  No physical exam could be performed with this format.  Please refer to the patient's chart for his consent to telehealth for [facility].    Date:  10/03/2022   ID:  Leroy Lara, DOB 11-Mar-1946, MRN [id number] The patient was identified using 2 identifiers.  Patient Location: Home Provider Location: Home Office   PCP:  Richmond Campbell PA-C   [facility] Providers Cardiologist:  [name], MD     Evaluation Performed:  Follow-Up Visit  Chief Complaint:  Preop clearance  History of Present Illness:    Leroy Lara is a 76 y.o. male with permanent atrial fibrillation, HTN, AAA, mild anemia, nonobstructive CAD, nonobstructive carotid stenosis. Last seen 04/20/22 by Dr. Excell Seltzer.   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 Per pharmacy review and office protocol may hold Eliquis 2 days prior to procedure and resume 24-48 hours post procedure as deemed by surgeon.    He presents today for phone visit. Notes he had to reschedule procedure due to family obligations but is planning to complete within next 2 months. Feeling well since last visit with no anginal symptoms. He stays active working. Able to achieve >4 METS of activity. Reports no shortness of breath nor dyspnea on exertion. Reports no chest pain, pressure, or tightness. No edema, orthopnea, PND. Reports no palpitations.     Past Medical History:  Diagnosis Date   AAA (abdominal aortic aneurysm)  (HCC)    Atrial fibrillation, permanent (HCC)    Carotid artery calcification, bilateral    Essential hypertension    Gastroesophageal reflux disease    History reviewed. No pertinent surgical history.   Current Meds  Medication Sig   apixaban (ELIQUIS) 5 MG TABS tablet TAKE 1 TABLET TWICE A DAY   clobetasol ointment (TEMOVATE) 0.05 %    fluticasone (FLONASE) 50 MCG/ACT nasal spray as needed.   hydrochlorothiazide (HYDRODIURIL) 12.5 MG tablet Take 1 tablet (12.5 mg total) by mouth daily.   lisinopril (ZESTRIL) 20 MG tablet    losartan (COZAAR) 100 MG tablet Take 1 tablet (100 mg total) by mouth daily.   omeprazole (PRILOSEC) 20 MG capsule Take 20 mg by mouth daily.   OXISTAT 1 % lotion as needed.   rosuvastatin (CRESTOR) 20 MG tablet Take 1 tablet (20 mg total) by mouth daily.     Allergies:   Sulfamethoxazole, Sulfasalazine, and Sulfonamide derivatives   Social History   Tobacco Use   Smoking status: Former   Smokeless tobacco: Never  Building services engineer Use: Never used  Substance Use Topics   Alcohol use: Never   Drug use: Never     Family Hx: The patient's family history includes Cancer - Lung in his father; Coronary artery disease (age of onset: 20) in his mother; Heart failure in his sister; Lymphoma (age of onset: 40) in his brother; Pulmonary fibrosis in his brother.  ROS:   Please see the history of present illness.     All other systems reviewed and are negative.  Prior CV studies:   The following studies were reviewed today:  Coronary CTA 09-23-2019: IMPRESSION: 1. Coronary artery calcium score 546 Agatston units. This places the patient in the 70th percentile for age and gender, suggesting intermediate risk for future cardiac events.   2.  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   3.  Possible moderate mid LAD stenosis.  Will send for FFR.   Echo 04/05/22:  1. Left ventricular ejection fraction, by estimation, is 50 to 55%. The  left ventricle has low  normal function. The left ventricle has no regional  wall motion abnormalities. Left ventricular diastolic parameters are  indeterminate.   2. Right ventricular systolic function is normal. The right ventricular  size is normal. There is mildly elevated pulmonary artery systolic  pressure. The estimated right ventricular systolic pressure is 03.5 mmHg.   3. Left atrial size was moderately dilated.   4. Right atrial size was moderately dilated.   5. The mitral valve is normal in structure. Mild mitral valve  regurgitation. No evidence of mitral stenosis.   6. The aortic valve is tricuspid. There is mild calcification of the  aortic valve. Aortic valve regurgitation is trivial. No aortic stenosis is  present.   7. The inferior vena cava is dilated in size with >50% respiratory  variability, suggesting right atrial pressure of 8 mmHg.   Comparison(s): 01/06/20 EF 60-65%.    Abdominal US - AAA: Summary:  Abdominal Aorta: There is evidence of abnormal dilatation of the distal  Abdominal aorta. The largest aortic measurement is 2.9 cm. The largest  aortic diameter remains essentially unchanged compared to prior exam.  Previous diameter measurement was 3.0 cm  obtained on 01/2020.  IVC/Iliac: There is no evidence of thrombus involving the IVC.   Carotid US: Summary:  Right Carotid: Velocities in the right ICA are consistent with a 1-39%  stenosis.   Left Carotid: Velocities in the left ICA are consistent with a 1-39%  stenosis.                Non-hemodynamically significant plaque <50% noted in the  CCA.   Vertebrals:  Bilateral vertebral arteries demonstrate antegrade flow.  Subclavians: Normal flow hemodynamics were seen in bilateral subclavian               arteries.     Labs/Other Tests and Data Reviewed:    EKG:  No ECG reviewed.  Recent Labs: 03/30/2022: BUN 14; Creatinine, Ser 1.03; Hemoglobin 13.1; Platelets 198; Potassium 4.0; Sodium 137   Recent Lipid Panel Lab  Results  Component Value Date/Time   CHOL 125 02/20/2021 08:24 AM   TRIG 84 02/20/2021 08:24 AM   HDL 45 02/20/2021 08:24 AM   CHOLHDL 2.8 02/20/2021 08:24 AM   CHOLHDL 3.0 01/10/2016 07:59 AM   LDLCALC 64 02/20/2021 08:24 AM   LDLDIRECT 154.8 08/26/2013 07:39 AM    Wt Readings from Last 3 Encounters:  04/20/22 186 lb 3.2 oz (84.5 kg)  03/03/21 196 lb (88.9 kg)  08/17/20 194 lb 3.2 oz (88.1 kg)     Risk Assessment/Calculations:    CHA2DS2-VASc Score = 4   This indicates a 4.8% annual risk of stroke. The patient's score is based upon: CHF History: 0 HTN History: 1 Diabetes History: 0 Stroke History: 0 Vascular Disease History: 1 Age Score: 2 Gender Score: 0         Objective:    Vital Signs:  No vitals available for review.   ASSESSMENT & PLAN:  Preoperative clearance - Able to achieve >4 METS. Per AHA/ACC guidelines, he is deemed acceptable risk for the planned procedure without additional cardiovascular testing. Will route to surgical team so they are aware.   Anticoagulant: May hold Eliquis 2 days prior to procedure. Resume post procedure 24-48 hours afterward at direction of surgeon. Mr. Pherigo is aware of these recommendations.  Presently in process of rescheduling procedure. He will contact us if any new or worsening symptoms arrive prior to procedure being rescheduled.  Time:   Today, I have spent 5 minutes with the patient with telehealth technology discussing the above problems.     Medication Adjustments/Labs and Tests Ordered: Current medicines are reviewed at length with the patient today.  Concerns regarding medicines are outlined above.   Tests Ordered: No orders of the defined types were placed in this encounter.   Medication Changes: No orders of the defined types were placed in this encounter.   Follow Up:  In Person  in November as scheduled  Signed, Alver Sorrow, NP  10/03/2022 2:47 PM     [facility]

## 2022-10-08 ENCOUNTER — Ambulatory Visit (HOSPITAL_BASED_OUTPATIENT_CLINIC_OR_DEPARTMENT_OTHER): Admission: RE | Admit: 2022-10-08 | Payer: Medicare Other | Source: Home / Self Care | Admitting: Otolaryngology

## 2022-10-08 DIAGNOSIS — Z01818 Encounter for other preprocedural examination: Secondary | ICD-10-CM

## 2022-10-08 SURGERY — MICROLARYNGOSCOPY WITH CO2 LASER AND EXCISION OF VOCAL CORD LESION
Anesthesia: General

## 2022-10-09 ENCOUNTER — Telehealth: Payer: Self-pay | Admitting: Physical Medicine and Rehabilitation

## 2022-10-09 NOTE — Telephone Encounter (Signed)
Pt called in requesting to reschedule his injection appt.... Pt requesting callback

## 2022-10-10 NOTE — Telephone Encounter (Signed)
Spoke with patient and rescheduled to 10/18/22

## 2022-10-16 ENCOUNTER — Ambulatory Visit: Payer: Medicare Other | Attending: Cardiovascular Disease

## 2022-10-16 DIAGNOSIS — Z79899 Other long term (current) drug therapy: Secondary | ICD-10-CM

## 2022-10-17 LAB — BASIC METABOLIC PANEL
BUN/Creatinine Ratio: 16 (ref 10–24)
BUN: 16 mg/dL (ref 8–27)
CO2: 26 mmol/L (ref 20–29)
Calcium: 8.9 mg/dL (ref 8.6–10.2)
Chloride: 104 mmol/L (ref 96–106)
Creatinine, Ser: 1.02 mg/dL (ref 0.76–1.27)
Glucose: 89 mg/dL (ref 70–99)
Potassium: 4.4 mmol/L (ref 3.5–5.2)
Sodium: 142 mmol/L (ref 134–144)
eGFR: 76 mL/min/{1.73_m2} (ref >59)

## 2022-10-18 ENCOUNTER — Ambulatory Visit (INDEPENDENT_AMBULATORY_CARE_PROVIDER_SITE_OTHER): Payer: Medicare Other | Admitting: Physical Medicine and Rehabilitation

## 2022-10-18 ENCOUNTER — Ambulatory Visit: Payer: Medicare Other

## 2022-10-18 VITALS — BP 136/66 | HR 39

## 2022-10-18 DIAGNOSIS — M5416 Radiculopathy, lumbar region: Secondary | ICD-10-CM | POA: Diagnosis not present

## 2022-10-18 MED ORDER — METHYLPREDNISOLONE ACETATE 80 MG/ML IJ SUSP
40.0000 mg | Freq: Once | INTRAMUSCULAR | Status: AC
  Administered 2022-10-18: 40 mg

## 2022-10-18 NOTE — Patient Instructions (Signed)

## 2022-10-18 NOTE — Progress Notes (Signed)
Numeric Pain Rating Scale and Functional Assessment Average Pain 6   In the last MONTH (on 0-10 scale) has pain interfered with the following?  1. General activity like being  able to carry out your everyday physical activities such as walking, climbing stairs, carrying groceries, or moving a chair?  Rating(6)   +Driver, -BT- Eliquis, -Dye Allergies.  More activity eases pain. Lower back pain that goes all the way across

## 2022-10-22 ENCOUNTER — Encounter: Payer: Self-pay | Admitting: Cardiovascular Disease

## 2022-10-22 ENCOUNTER — Ambulatory Visit: Payer: Medicare Other | Admitting: Cardiovascular Disease

## 2022-10-22 ENCOUNTER — Ambulatory Visit: Payer: Medicare Other | Attending: Cardiovascular Disease | Admitting: Cardiovascular Disease

## 2022-10-22 ENCOUNTER — Ambulatory Visit (INDEPENDENT_AMBULATORY_CARE_PROVIDER_SITE_OTHER): Payer: Medicare Other

## 2022-10-22 VITALS — BP 110/58 | HR 50 | Ht 72.0 in | Wt 182.4 lb

## 2022-10-22 DIAGNOSIS — I714 Abdominal aortic aneurysm, without rupture, unspecified: Secondary | ICD-10-CM | POA: Insufficient documentation

## 2022-10-22 DIAGNOSIS — I2510 Atherosclerotic heart disease of native coronary artery without angina pectoris: Secondary | ICD-10-CM

## 2022-10-22 DIAGNOSIS — I4819 Other persistent atrial fibrillation: Secondary | ICD-10-CM

## 2022-10-22 DIAGNOSIS — I10 Essential (primary) hypertension: Secondary | ICD-10-CM | POA: Diagnosis not present

## 2022-10-22 DIAGNOSIS — E782 Mixed hyperlipidemia: Secondary | ICD-10-CM | POA: Insufficient documentation

## 2022-10-22 DIAGNOSIS — I6523 Occlusion and stenosis of bilateral carotid arteries: Secondary | ICD-10-CM | POA: Diagnosis not present

## 2022-10-22 NOTE — Progress Notes (Signed)
Cardiology Office Note:    Date:  10/30/2022   ID:  Leroy Lara, DOB [birth year], MRN [id number]  PCP:  Richmond Campbell PA-C   [facility] Providers Cardiologist:  [name], MD     Referring MD: Richmond Campbell., PA-C   Chief Complaint  Patient presents with   Atrial Fibrillation    History of Present Illness:    Leroy Lara is a 76 y.o. male with a hx of  permanent atrial fibrillation with slow ventricular rate, hypertension, abdominal aortic aneurysm, and mild anemia nonobstructive carotid stenosis, presenting for follow-up evaluation today.  The patient is here alone.   The patient is here alone today.  He has been getting some low heart rate readings sometimes in the 30s and 40s.  He has not had presyncope or frank syncope.  However, he has had some dizziness and gait unsteadiness.  He especially feels this way when he first stands up.  He denies chest pain, chest pressure, leg swelling, orthopnea, or PND.  He experiences some shortness of breath when he first starts an activity but then he is able to continue on sometimes for even a few hours without any further breathing problems or other exertional-related symptoms.  Past Medical History:  Diagnosis Date   AAA (abdominal aortic aneurysm) (HCC)    Atrial fibrillation, permanent (HCC)    Carotid artery calcification, bilateral    Essential hypertension    Gastroesophageal reflux disease     History reviewed. No pertinent surgical history.  Current Medications: Current Meds  Medication Sig   apixaban (ELIQUIS) 5 MG TABS tablet TAKE 1 TABLET TWICE A DAY   clobetasol ointment (TEMOVATE) 0.05 %    fluticasone (FLONASE) 50 MCG/ACT nasal spray as needed.   hydrochlorothiazide (HYDRODIURIL) 12.5 MG tablet Take 1 tablet (12.5 mg total) by mouth daily.   losartan (COZAAR) 100 MG tablet Take 1 tablet (100 mg total) by mouth daily.   omeprazole (PRILOSEC) 20 MG capsule Take 20 mg by mouth daily.    OXISTAT 1 % lotion as needed.   rosuvastatin (CRESTOR) 20 MG tablet Take 1 tablet (20 mg total) by mouth daily.   [DISCONTINUED] lisinopril (ZESTRIL) 20 MG tablet    Current Facility-Administered Medications for the 10/22/22 encounter (Office Visit) with [name], MD  Medication   methylPREDNISolone acetate (DEPO-MEDROL) injection 40 mg     Allergies:   Sulfamethoxazole, Sulfasalazine, and Sulfonamide derivatives   Social History   Socioeconomic History   Marital status: Married    Spouse name: Not on file   Number of children: Not on file   Years of education: Not on file   Highest education level: Not on file  Occupational History   Not on file  Tobacco Use   Smoking status: Former   Smokeless tobacco: Never  Vaping Use   Vaping Use: Never used  Substance and Sexual Activity   Alcohol use: Never   Drug use: Never   Sexual activity: Not on file  Other Topics Concern   Not on file  Social History Narrative   Not on file   Social Determinants of Health   Financial Resource Strain: Not on file  Food Insecurity: Not on file  Transportation Needs: Not on file  Physical Activity: Not on file  Stress: Not on file  Social Connections: Not on file     Family History: The patient's family history includes Cancer - Lung in his father; Coronary artery disease (age of onset: 23)  in his mother; Heart failure in his sister; Lymphoma (age of onset: 5337) in his brother; Pulmonary fibrosis in his brother.  ROS:   Please see the history of present illness.    All other systems reviewed and are negative.  EKGs/Labs/Other Studies Reviewed:    The following studies were reviewed today: Event Monitor 07/21/2020: The basic rhythm is atrial fibrillation with an average HR of 55 bpm There are periods of slow atrial fibrillation with competing junctional pacemaker heart rate in the 35-40 bpm range, occurring during normal sleep hours No pathologic pauses > 3 seconds No  significant tachyarrhythmias  GXT 06/19/2018: Blood pressure demonstrated a hypertensive response to exercise. Baseline EKG showed atrial fibrillation with LBBB. Nondiagnostic EKG for ischemia due to underlying LBBB. Normal chronotropic response to exercise with adequate heart rate response reaching 90% of MPHR. Mild to moderately impaired exercise capacity at 5.6 mets.  Echo 04/05/2022:  1. Left ventricular ejection fraction, by estimation, is 50 to 55%. The  left ventricle has low normal function. The left ventricle has no regional  wall motion abnormalities. Left ventricular diastolic parameters are  indeterminate.   2. Right ventricular systolic function is normal. The right ventricular  size is normal. There is mildly elevated pulmonary artery systolic  pressure. The estimated right ventricular systolic pressure is 38.7 mmHg.   3. Left atrial size was moderately dilated.   4. Right atrial size was moderately dilated.   5. The mitral valve is normal in structure. Mild mitral valve  regurgitation. No evidence of mitral stenosis.   6. The aortic valve is tricuspid. There is mild calcification of the  aortic valve. Aortic valve regurgitation is trivial. No aortic stenosis is  present.   7. The inferior vena cava is dilated in size with >50% respiratory  variability, suggesting right atrial pressure of 8 mmHg.   Comparison(s): 01/06/20 EF 60-65%.   EKG:  EKG is ordered today.  The ekg ordered today demonstrates atrial fibrillation with slow ventricular response 50 bpm, occasional ventricular ectopic beats, left bundle branch block  Recent Labs: 03/30/2022: Hemoglobin 13.1; Platelets 198 10/16/2022: BUN 16; Creatinine, Ser 1.02; Potassium 4.4; Sodium 142  Recent Lipid Panel    Component Value Date/Time   CHOL 125 02/20/2021 0824   TRIG 84 02/20/2021 0824   HDL 45 02/20/2021 0824   CHOLHDL 2.8 02/20/2021 0824   CHOLHDL 3.0 01/10/2016 0759   VLDL 19 01/10/2016 0759   LDLCALC 64  02/20/2021 0824   LDLDIRECT 154.8 08/26/2013 0739     Risk Assessment/Calculations:    CHA2DS2-VASc Score = 4   This indicates a 4.8% annual risk of stroke. The patient's score is based upon: CHF History: 0 HTN History: 1 Diabetes History: 0 Stroke History: 0 Vascular Disease History: 1 Age Score: 2 Gender Score: 0            Physical Exam:    VS:  BP (!) 110/58   Pulse (!) 50   Ht 6' (1.829 m)   Wt 182 lb 6.4 oz (82.7 kg)   SpO2 98%   BMI 24.74 kg/m     Wt Readings from Last 3 Encounters:  10/22/22 182 lb 6.4 oz (82.7 kg)  04/20/22 186 lb 3.2 oz (84.5 kg)  03/03/21 196 lb (88.9 kg)     GEN:  Well nourished, well developed in no acute distress HEENT: Normal NECK: No JVD; No carotid bruits LYMPHATICS: No lymphadenopathy CARDIAC: Irregularly irregular with 2/6 systolic murmur at the right upper sternal border  RESPIRATORY:  Clear to auscultation without rales, wheezing or rhonchi  ABDOMEN: Soft, non-tender, non-distended MUSCULOSKELETAL:  No edema; No deformity  SKIN: Warm and dry NEUROLOGIC:  Alert and oriented x 3 PSYCHIATRIC:  Normal affect   ASSESSMENT:    1. Persistent atrial fibrillation (HCC)   2. Essential hypertension   3. Abdominal aortic aneurysm (AAA) without rupture, unspecified part (HCC)   4. Mixed hyperlipidemia   5. Coronary artery disease involving native coronary artery of native heart without angina pectoris    PLAN:    In order of problems listed above:  The patient has longstanding atrial fibrillation with slow ventricular response.  He has been having slower heart rate readings.  He has a left bundle branch block and PVCs on his EKG today.  I am going to check another 3-day ZIO monitor as it has been 2 years since his last study.  He may require EP referral for consideration of a pacemaker.  We discussed this today.  He is not on any beta-blockade or other rate slowing medications. Blood pressure is well controlled.  Lisinopril and  losartan are both on his medications and list, but I do not think he is taking both of these.  He is going to check when he gets home and will reach out to me. The patient's abdominal aorta is measured at 2.9 to 3 cm in maximal diameter.  His last duplex study was in May 2023.  We will follow this up in 2025 for 34-month surveillance. Treated with rosuvastatin 20 mg daily.  Last lipids with a cholesterol of 125, LDL 64. Patient with nonflow limiting coronary artery disease.  CT coronary angiogram done in 2020.  Findings reviewed today with minor nonobstructive plaquing in the left circumflex and RCA and moderate but nonflow limiting disease in the LAD.  Continue with medical management.      The patient will be scheduled for follow-up in 6 months with a repeat echocardiogram at that time.  His last echo showed borderline/low normal LVEF of 50 to 55%.  We will follow-up with him after the results of his 3-day ZIO monitor are completed.   Medication Adjustments/Labs and Tests Ordered: Current medicines are reviewed at length with the patient today.  Concerns regarding medicines are outlined above.  Orders Placed This Encounter  Procedures   LONG TERM MONITOR (3-14 DAYS)   EKG 12-Lead   ECHOCARDIOGRAM COMPLETE   No orders of the defined types were placed in this encounter.   Patient Instructions  Medication Instructions:  **Please send [place] a message to let us know if you are taking both/either Lisinopril and Losartan** *If you need a refill on your cardiac medications before your next appointment, please call your pharmacy*   Lab Work: NONE If you have labs (blood work) drawn today and your tests are completely normal, you will receive your results only by: MyChart Message (if you have MyChart) OR A paper copy in the mail If you have any lab test that is abnormal or we need to change your treatment, we will call you to review the results.  Testing/Procedures: 3-day Zio monitor Your  physician has recommended that you wear an event monitor. Event monitors are medical devices that record the heart's electrical activity. Doctors most often [place] these monitors to diagnose arrhythmias. Arrhythmias are problems with the speed or rhythm of the heartbeat. The monitor is a small, portable device. You can wear one while you do your normal daily activities. This is usually used  to diagnose what is causing palpitations/syncope (passing out).  ECHO (prior to next visit in 6 months) Your physician has requested that you have an echocardiogram. Echocardiography is a painless test that uses sound waves to create images of your heart. It provides your doctor with information about the size and shape of your heart and how well your heart's chambers and valves are working. This procedure takes approximately one hour. There are no restrictions for this procedure. Please do NOT wear cologne, perfume, aftershave, or lotions (deodorant is allowed). Please arrive 15 minutes prior to your appointment time.  Follow-Up: At [facility], you and your health needs are our priority.  As part of our continuing mission to provide you with exceptional heart care, we have created designated Provider Care Teams.  These Care Teams include your primary Cardiologist (physician) and Advanced Practice Providers (APPs -  Physician Assistants and Nurse Practitioners) who all work together to provide you with the care you need, when you need it.  Your next appointment:   6 month(s)  The format for your next appointment:   In Person  Provider:   [name], MD      Other Instructions ZIO XT- Long Term Monitor Instructions  Your physician has requested you wear a ZIO patch monitor for 3 days.  This is a single patch monitor. [employer] supplies one patch monitor per enrollment. Additional stickers are not available. Please do not apply patch if you will be having a Nuclear Stress Test,  Echocardiogram,  Cardiac CT, MRI, or Chest Xray during the period you would be wearing the  monitor. The patch cannot be worn during these tests. You cannot remove and re-apply the  ZIO XT patch monitor.  Your ZIO patch monitor will be mailed 3 day USPS to your address on file. It may take 3-5 days  to receive your monitor after you have been enrolled.  Once you have received your monitor, please review the enclosed instructions. Your monitor  has already been registered assigning a specific monitor serial # to you.  Billing and Patient Assistance Program Information  We have supplied [employer] with any of your insurance information on file for billing purposes. [employer] offers a sliding scale Patient Assistance Program for patients that do not have  insurance, or whose insurance does not completely cover the cost of the ZIO monitor.  You must apply for the Patient Assistance Program to qualify for this discounted rate.  To apply, please call [employer] at [phone], select option 4, select option 2, ask to apply for  Patient Assistance Program. [name] will ask your household income, and how many people  are in your household. They will quote your out-of-pocket cost based on that information.  [employer] will also be able to set up a 20-month, interest-free payment plan if needed.  Applying the monitor   Shave hair from upper left chest.  Hold abrader disc by orange tab. Rub abrader in 40 strokes over the upper left chest as  indicated in your monitor instructions.  Clean area with 4 enclosed alcohol pads. Let dry.  Apply patch as indicated in monitor instructions. Patch will be placed under collarbone on left  side of chest with arrow pointing upward.  Rub patch adhesive wings for 2 minutes. Remove white label marked "1". Remove the white  label marked "2". Rub patch adhesive wings for 2 additional minutes.  While looking in a mirror, press and release button in center of patch. A small green light will  flash 3-4 times. This will be your only indicator that the monitor has been turned on.  Do not shower for the first 24 hours. You may shower after the first 24 hours.  Press the button if you feel a symptom. You will hear a small click. Record Date, Time and  Symptom in the Patient Logbook.  When you are ready to remove the patch, follow instructions on the last 2 pages of Patient  Logbook. Stick patch monitor onto the last page of Patient Logbook.  Place Patient Logbook in the blue and white box. Use locking tab on box and tape box closed  securely. The blue and white box has prepaid postage on it. Please place it in the mailbox as  soon as possible. Your physician should have your test results approximately 7 days after the  monitor has been mailed back to [facility].  Call [facility] Customer Care at [phone] if you have questions regarding  your ZIO XT patch monitor. Call them immediately if you see an orange light blinking on your  monitor.  If your monitor falls off in less than 4 days, contact our Monitor department at [phone].  If your monitor becomes loose or falls off after 4 days call [employer] at [phone] for  suggestions on securing your monitor   Important Information About Sugar         Signed, [name], MD  10/30/2022 2:26 PM    [facility]

## 2022-10-22 NOTE — Progress Notes (Unsigned)
Enrolled for [employer] to mail a ZIO XT long term holter monitor to the patients address on file.  

## 2022-10-22 NOTE — Patient Instructions (Signed)
Medication Instructions:  **Please send Leroy Lara a message to let Leroy Lara know if you are taking both/either Lisinopril and Losartan** *If you need a refill on your cardiac medications before your next appointment, please call your pharmacy*   Lab Work: NONE If you have labs (blood work) drawn today and your tests are completely normal, you will receive your results only by: MyChart Message (if you have MyChart) OR A paper copy in the mail If you have any lab test that is abnormal or we need to change your treatment, we will call you to review the results.  Testing/Procedures: 3-day Zio monitor Your physician has recommended that you wear an event monitor. Event monitors are medical devices that record the heart's electrical activity. Doctors most often Leroy Lara these monitors to diagnose arrhythmias. Arrhythmias are problems with the speed or rhythm of the heartbeat. The monitor is a small, portable device. You can wear one while you do your normal daily activities. This is usually used to diagnose what is causing palpitations/syncope (passing out).  ECHO (prior to next visit in 6 months) Your physician has requested that you have an echocardiogram. Echocardiography is a painless test that uses sound waves to create images of your heart. It provides your doctor with information about the size and shape of your heart and how well your heart's chambers and valves are working. This procedure takes approximately one hour. There are no restrictions for this procedure. Please do NOT wear cologne, perfume, aftershave, or lotions (deodorant is allowed). Please arrive 15 minutes prior to your appointment time.  Follow-Up: At [facility], you and your health needs are our priority.  As part of our continuing mission to provide you with exceptional heart care, we have created designated Provider Care Teams.  These Care Teams include your primary Cardiologist (physician) and Advanced Practice Providers (APPs -   Physician Assistants and Nurse Practitioners) who all work together to provide you with the care you need, when you need it.  Your next appointment:   6 month(s)  The format for your next appointment:   In Person  Provider:   [name], MD      Other Instructions ZIO XT- Long Term Monitor Instructions  Your physician has requested you wear a ZIO patch monitor for 3 days.  This is a single patch monitor. [employer] supplies one patch monitor per enrollment. Additional stickers are not available. Please do not apply patch if you will be having a Nuclear Stress Test,  Echocardiogram, Cardiac CT, MRI, or Chest Xray during the period you would be wearing the  monitor. The patch cannot be worn during these tests. You cannot remove and re-apply the  ZIO XT patch monitor.  Your ZIO patch monitor will be mailed 3 day USPS to your address on file. It may take 3-5 days  to receive your monitor after you have been enrolled.  Once you have received your monitor, please review the enclosed instructions. Your monitor  has already been registered assigning a specific monitor serial # to you.  Billing and Patient Assistance Program Information  We have supplied [employer] with any of your insurance information on file for billing purposes. [employer] offers a sliding scale Patient Assistance Program for patients that do not have  insurance, or whose insurance does not completely cover the cost of the ZIO monitor.  You must apply for the Patient Assistance Program to qualify for this discounted rate.  To apply, please call [employer] at [phone], select option 4, select  option 2, ask to apply for  Patient Assistance Program. [name] will ask your household income, and how many people  are in your household. They will quote your out-of-pocket cost based on that information.  [employer] will also be able to set up a 25-month, interest-free payment plan if needed.  Applying the monitor   Shave hair from  upper left chest.  Hold abrader disc by orange tab. Rub abrader in 40 strokes over the upper left chest as  indicated in your monitor instructions.  Clean area with 4 enclosed alcohol pads. Let dry.  Apply patch as indicated in monitor instructions. Patch will be placed under collarbone on left  side of chest with arrow pointing upward.  Rub patch adhesive wings for 2 minutes. Remove white label marked "1". Remove the white  label marked "2". Rub patch adhesive wings for 2 additional minutes.  While looking in a mirror, press and release button in center of patch. A small green light will  flash 3-4 times. This will be your only indicator that the monitor has been turned on.  Do not shower for the first 24 hours. You may shower after the first 24 hours.  Press the button if you feel a symptom. You will hear a small click. Record Date, Time and  Symptom in the Patient Logbook.  When you are ready to remove the patch, follow instructions on the last 2 pages of Patient  Logbook. Stick patch monitor onto the last page of Patient Logbook.  Place Patient Logbook in the blue and white box. Use locking tab on box and tape box closed  securely. The blue and white box has prepaid postage on it. Please place it in the mailbox as  soon as possible. Your physician should have your test results approximately 7 days after the  monitor has been mailed back to [facility].  Call [facility] Customer Care at [phone] if you have questions regarding  your ZIO XT patch monitor. Call them immediately if you see an orange light blinking on your  monitor.  If your monitor falls off in less than 4 days, contact our Monitor department at [phone].  If your monitor becomes loose or falls off after 4 days call [employer] at [phone] for  suggestions on securing your monitor   Important Information About Sugar

## 2022-10-24 ENCOUNTER — Encounter: Payer: Self-pay | Admitting: Cardiovascular Disease

## 2022-10-25 DIAGNOSIS — E782 Mixed hyperlipidemia: Secondary | ICD-10-CM | POA: Diagnosis not present

## 2022-10-25 DIAGNOSIS — I714 Abdominal aortic aneurysm, without rupture, unspecified: Secondary | ICD-10-CM

## 2022-10-25 DIAGNOSIS — I2510 Atherosclerotic heart disease of native coronary artery without angina pectoris: Secondary | ICD-10-CM

## 2022-10-25 DIAGNOSIS — I4819 Other persistent atrial fibrillation: Secondary | ICD-10-CM | POA: Diagnosis not present

## 2022-10-25 DIAGNOSIS — I10 Essential (primary) hypertension: Secondary | ICD-10-CM | POA: Diagnosis not present

## 2022-10-29 ENCOUNTER — Ambulatory Visit
Admission: RE | Admit: 2022-10-29 | Discharge: 2022-10-29 | Disposition: A | Discharge status: Home / Self Care | Payer: Medicare Other | Source: Ambulatory Visit | Attending: Family Medicine | Admitting: Family Medicine

## 2022-10-29 DIAGNOSIS — R911 Solitary pulmonary nodule: Secondary | ICD-10-CM

## 2022-10-30 ENCOUNTER — Encounter: Payer: Self-pay | Admitting: Cardiovascular Disease

## 2022-10-31 NOTE — Progress Notes (Signed)
Leroy Lara - 76 y.o. male MRN [id number]  Date of birth: 09/09/1946  Office Visit Note: Visit Date: 10/18/2022 PCP: Richmond Campbell., PA-C Referred by: Richmond Campbell., PA-C  Subjective: Chief Complaint  Patient presents with   Lower Back - Pain   HPI:  Leroy Lara is a 76 y.o. male who comes in today for planned repeat Right L4-5  Lumbar Transforaminal epidural steroid injection with fluoroscopic guidance.  The patient has failed conservative care including home exercise, medications, time and activity modification.  This injection will be diagnostic and hopefully therapeutic.  Please see requesting physician notes for further details and justification. Patient received more than 50% pain relief from prior injection.   Referring: Ellin Goodie, FNP and Barnie Del, FNP   ROS Otherwise per HPI.  Assessment & Plan: Visit Diagnoses:    ICD-10-CM   1. Lumbar radiculopathy  M54.16 XR C-ARM NO REPORT    Epidural Steroid injection    methylPREDNISolone acetate (DEPO-MEDROL) injection 40 mg      Plan: No additional findings.   Meds & Orders:  Meds ordered this encounter  Medications   methylPREDNISolone acetate (DEPO-MEDROL) injection 40 mg    Orders Placed This Encounter  Procedures   XR C-ARM NO REPORT   Epidural Steroid injection    Follow-up: Return for visit to requesting provider as needed.   Procedures: No procedures performed  Lumbosacral Transforaminal Epidural Steroid Injection - Sub-Pedicular Approach with Fluoroscopic Guidance  Patient: Leroy Lara      Date of Birth: [birth year] MRN: [id number] PCP: Richmond Campbell., PA-C      Visit Date: 10/18/2022   Universal Protocol:    Date/Time: 10/18/2022  Consent Given By: the patient  Position: PRONE  Additional Comments: Vital signs were monitored before and after the procedure. Patient was prepped and draped in the usual sterile fashion. The correct patient, procedure, and site was  verified.   Injection Procedure Details:   Procedure diagnoses: Lumbar radiculopathy [M54.16]    Meds Administered:  Meds ordered this encounter  Medications   methylPREDNISolone acetate (DEPO-MEDROL) injection 40 mg    Laterality: Right  Location/Site: L4  Needle:5.0 in., 22 ga.  Short bevel or Quincke spinal needle  Needle Placement: Transforaminal  Findings:    -Comments: Excellent flow of contrast along the nerve, nerve root and into the epidural space.  Procedure Details: After squaring off the end-plates to get a true AP view, the C-arm was positioned so that an oblique view of the foramen as noted above was visualized. The target area is just inferior to the "nose of the scotty dog" or sub pedicular. The soft tissues overlying this structure were infiltrated with 2-3 ml. of 1% Lidocaine without Epinephrine.  The spinal needle was inserted toward the target using a "trajectory" view along the fluoroscope beam.  Under AP and lateral visualization, the needle was advanced so it did not puncture dura and was located close the 6 O'Clock position of the pedical in AP tracterory. Biplanar projections were used to confirm position. Aspiration was confirmed to be negative for CSF and/or blood. A 1-2 ml. volume of Isovue-250 was injected and flow of contrast was noted at each level. Radiographs were obtained for documentation purposes.   After attaining the desired flow of contrast documented above, a 0.5 to 1.0 ml test dose of 0.25% Marcaine was injected into each respective transforaminal space.  The patient was observed for 90 seconds post injection.  After no sensory deficits  were reported, and normal lower extremity motor function was noted,   the above injectate was administered so that equal amounts of the injectate were placed at each foramen (level) into the transforaminal epidural space.   Additional Comments:  The patient tolerated the procedure well Dressing: 2 x 2 sterile  gauze and Band-Aid    Post-procedure details: Patient was observed during the procedure. Post-procedure instructions were reviewed.  Patient left the clinic in stable condition.    Clinical History: No specialty comments available.     Objective:  VS:  HT:    WT:   BMI:     BP:136/66  HR:(!) 39bpm  TEMP: ( )  RESP:  Physical Exam Vitals and nursing note reviewed.  Constitutional:      General: He is not in acute distress.    Appearance: Normal appearance. He is not ill-appearing.  HENT:     Head: Normocephalic and atraumatic.     Right Ear: External ear normal.     Left Ear: External ear normal.     Nose: No congestion.  Eyes:     Extraocular Movements: Extraocular movements intact.  Cardiovascular:     Rate and Rhythm: Normal rate.     Pulses: Normal pulses.  Pulmonary:     Effort: Pulmonary effort is normal. No respiratory distress.  Abdominal:     General: There is no distension.     Palpations: Abdomen is soft.  Musculoskeletal:        General: No tenderness or signs of injury.     Cervical back: Neck supple.     Right lower leg: No edema.     Left lower leg: No edema.     Comments: Patient has good distal strength without clonus.  Skin:    Findings: No erythema or rash.  Neurological:     General: No focal deficit present.     Mental Status: He is alert and oriented to person, place, and time.     Sensory: No sensory deficit.     Motor: No weakness or abnormal muscle tone.     Coordination: Coordination normal.  Psychiatric:        Mood and Affect: Mood normal.        Behavior: Behavior normal.      Imaging: No results found.

## 2022-10-31 NOTE — Procedures (Signed)
Lumbosacral Transforaminal Epidural Steroid Injection - Sub-Pedicular Approach with Fluoroscopic Guidance  Patient: Leroy Lara      Date of Birth: [birth year] MRN: [id number] PCP: Richmond Campbell., PA-C      Visit Date: 10/18/2022   Universal Protocol:    Date/Time: 10/18/2022  Consent Given By: the patient  Position: PRONE  Additional Comments: Vital signs were monitored before and after the procedure. Patient was prepped and draped in the usual sterile fashion. The correct patient, procedure, and site was verified.   Injection Procedure Details:   Procedure diagnoses: Lumbar radiculopathy [M54.16]    Meds Administered:  Meds ordered this encounter  Medications   methylPREDNISolone acetate (DEPO-MEDROL) injection 40 mg    Laterality: Right  Location/Site: L4  Needle:5.0 in., 22 ga.  Short bevel or Quincke spinal needle  Needle Placement: Transforaminal  Findings:    -Comments: Excellent flow of contrast along the nerve, nerve root and into the epidural space.  Procedure Details: After squaring off the end-plates to get a true AP view, the C-arm was positioned so that an oblique view of the foramen as noted above was visualized. The target area is just inferior to the "nose of the scotty dog" or sub pedicular. The soft tissues overlying this structure were infiltrated with 2-3 ml. of 1% Lidocaine without Epinephrine.  The spinal needle was inserted toward the target using a "trajectory" view along the fluoroscope beam.  Under AP and lateral visualization, the needle was advanced so it did not puncture dura and was located close the 6 O'Clock position of the pedical in AP tracterory. Biplanar projections were used to confirm position. Aspiration was confirmed to be negative for CSF and/or blood. A 1-2 ml. volume of Isovue-250 was injected and flow of contrast was noted at each level. Radiographs were obtained for documentation purposes.   After attaining the desired  flow of contrast documented above, a 0.5 to 1.0 ml test dose of 0.25% Marcaine was injected into each respective transforaminal space.  The patient was observed for 90 seconds post injection.  After no sensory deficits were reported, and normal lower extremity motor function was noted,   the above injectate was administered so that equal amounts of the injectate were placed at each foramen (level) into the transforaminal epidural space.   Additional Comments:  The patient tolerated the procedure well Dressing: 2 x 2 sterile gauze and Band-Aid    Post-procedure details: Patient was observed during the procedure. Post-procedure instructions were reviewed.  Patient left the clinic in stable condition.

## 2022-11-06 ENCOUNTER — Encounter (HOSPITAL_BASED_OUTPATIENT_CLINIC_OR_DEPARTMENT_OTHER): Payer: Self-pay | Admitting: Otolaryngology

## 2022-11-06 ENCOUNTER — Other Ambulatory Visit: Payer: Self-pay | Admitting: Cardiovascular Disease

## 2022-11-06 ENCOUNTER — Other Ambulatory Visit: Payer: Self-pay

## 2022-11-06 DIAGNOSIS — I4891 Unspecified atrial fibrillation: Secondary | ICD-10-CM

## 2022-11-06 NOTE — Telephone Encounter (Signed)
Prescription refill request for Eliquis received. Indication:afib Last office visit:11/23 Scr:1.0 Age: 76 Weight:82.6  kg  Prescription refilled

## 2022-11-06 NOTE — Progress Notes (Signed)
   11/06/22 1302  PAT Phone Screen  Is the patient taking a GLP-1 receptor agonist? No  Do You Have Diabetes? No  Do You Have Hypertension? Yes  Have You Ever Been to the ER for Asthma? No  Have You Taken Oral Steroids in the Past 3 Months? No  Do you Take Phenteramine or any Other Diet Drugs? No  Recent  Lab Work, EKG, CXR? Yes  Where was this test performed? 10-22-22 EKG-afib rate 50, 10-16-22 BMP  Do you have a history of heart problems? (S)  Yes (permanent a-fib)  Cardiologist Name Dr Excell Seltzer  Have you ever had tests on your heart? Yes  What cardiac tests were performed? Echo  What date/year were cardiac tests completed? 04-05-22 ECHO EF 50-55%  Results viewable: [facility] Media Tab  Any Recent Hospitalizations? No  Height 6' (1.829 m)  Weight 82.6 kg  Pat Appointment Scheduled No  Reason for No Appointment Not Needed

## 2022-11-06 NOTE — H&P (Signed)
CHIEF COMPLAINT: acid reflux, hoarseness, nasal polyps  HISTORY OF PRESENT ILLNESS: Leroy Lara is a 76 y.o. year old male with who presents with multiple complaints including acid reflux, hoarseness, nasal polyps, nasal congestion. Regarding acid reflux he experiences this intermittently. He takes omeprazole 40 mg daily and has for years. Initially a sensation of phlegm in the back of his throat that he has to spit out. Has had increasing hoarseness over the past several months (possbily 1 year total) that seems to be worsening. He states he has had prior esophagoscopy April 2023 which was "clear". He states he has been treated with systemic steroids for nasal polyps in the past, notably had a CT scan 15 to 20 years ago commenting on nasal polyps. Not currently using any topical treatment, has Flonase at home but rarely uses this. From a nasal standpoint denies nasal congestion most days of the week but cannot experience sinus pressure. Also notes alternating nasal congestion at nighttime that switches between the 2 sides.  Distant smoker (quit 25 years ago).  Medical history includes taking Eliquis for atrial fibrillation  PAST MEDICAL HISTORY. Past Medical History: Diagnosis Date  Arthritis  Atrial fibrillation (HCC)  Diverticulosis  Emphysema lung (HCC)  GERD with esophagitis  History of adenomatous polyp of colon  Hyperlipidemia  Hypertension  Internal hemorrhoids  PAST SURGICAL HISTORY Past Surgical History: Procedure Laterality Date  CATARACT EXTRACTION W/ INTRAOCULAR LENS IMPLANT Right 02/13/2017 [facility]00 18.5  CATARACT EXTRACTION W/ INTRAOCULAR LENS IMPLANT Left 02/20/2017 [facility]00 18.0  COLONOSCOPY 2013, 2017, 2023  ESOPHAGOGASTRODUODENOSCOPY 2013,2016, 2023  HAND SURGERY Left  HEMORRHOID BANDING 3267,1245  ALLERGIES Other, Sulfamethoxazole, and Sulfasalazine  MEDICATIONS  Current Outpatient Medications:  apixaban (ELIQUIS) 5 mg tablet *ANTICOAGULANT*, Take by mouth 2  times daily. , Disp: , Rfl:  clobetasoL (TEMOVATE) 0.05 % ointment, , Disp: , Rfl:  clotrimazole-betamethasone (LOTRISONE) 1-0.05 % cream, Please use small pea sized amount on the affected skin twice daily for 7-14 days., Disp: 45 g, Rfl: 1  fluticasone propionate (FLONASE) 50 mcg/actuation nasal spray, Administer 2 sprays in each nostril daily., Disp: 16 g, Rfl: 11  hydroCHLOROthiazide (HYDRODIURIL) 25 MG tablet, , Disp: , Rfl:  lifitegrast (XIIDRA) 5 % ophthalmic solution, Place 1 drop into both eyes 2 times daily., Disp: 56 each, Rfl: 11  losartan (COZAAR) 100 MG tablet, , Disp: , Rfl:  multivitamin (TAB-A-VITE) tablet, Take 1 tablet by mouth., Disp: , Rfl:  rosuvastatin (CRESTOR) 20 MG tablet, , Disp: , Rfl:  budesonide (PULMICORT) 0.5 mg/2 mL nebulizer solution, Add 2 mL (1 vial) of budesonide into 240 mL saline irrigation bottle and irrigate both nostrils morning and night (one bottle total per day)., Disp: 60 mL, Rfl: 11  omeprazole (PRILOSEC) 40 MG capsule, Take 1 capsule (40 mg total) by mouth daily., Disp: 90 capsule, Rfl: 1  SOCIAL HISTORY: Social History  Tobacco Use  Smoking status: Former Packs/day: 1.00 Years: 30.00 Additional pack years: 0.00 Total pack years: 30.00 Types: Cigarettes  Smokeless tobacco: Never Vaping Use  Vaping Use: Never used Substance Use Topics  Alcohol use: Not Currently  Drug use: No  FAMILY HISTORY Family History Problem Relation Age of Onset  Hypertension Mother  Heart disease Mother  Arthritis Mother  Lung cancer Father  Hypertension Father  Arthritis Father  Stroke Maternal Grandmother  Cataracts Neg Hx  Glaucoma Neg Hx  Diabetes Neg Hx  Macular degeneration Neg Hx  Retinal detachment Neg Hx  Strabismus Neg Hx  Thyroid disease Neg Hx   Review  Of Systems: All systems were reviewed and were negative except for those mentioned in the HPI.  Past medical history, past surgical history, drug allergies, medications, and pertinent  social and family medical history were reviewed with the patient, and details of these were provided are in the Otolaryngology patient questionnaire. This was reviewed and verified today.  PHYSICAL EXAMINATION: BP 144/71  Pulse 47  Temp 97.1 F (36.2 C) (Temporal)  Ht 1.829 m (6')  Wt 84.4 kg (186 lb)  BMI 25.23 kg/m General/Constitutional: Patient is a well-nourished, well-developed male appearing stated age in no acute distress. Voice quality is good.  Psych: Alert & oriented to time, place and person. Answers questions appropriately.  Skin/scalp : Normal and without lesions. No rashes, ulcerations or masses noted.  Head: Normocephalic. No asymmetries noted. No tenderness to palpation of the soft tissue or bony structures of the face or skull.  Eyes: Normal extraocular motions without diplopia. Pupils equal round and reactive to light. No nystagmus noted.  ENT: Ears: Right Ear: Normal pinna, EAC clear; TM intact, no perforation, no effusion, no global retraction, no retraction pockets. Left Ear: Normal pinna, EAC clear; TM intact, no perforation, no effusion, no global retraction, no retraction pockets.  Nose and Sinuses: No paranasal sinus tenderness. Mucosa without lesions. No polyps are seen. Septum midline and straight.  Mouth: Teeth, gums, tongue, floor of the mouth, retromolar trigone, buccal mucosa, hard palate - normal without lesions. Normal jaw opening. Occlusion - normal.  Oropharynx: Tonsillar fossae, pharyngeal walls, base of tongue - normal. No bleeding or masses.  Neck/Musculoskeletal: Supple. No masses. No areas of tenderness. Nonpalpable thyroid. No limitations in flexion/extension.  Lymph: No cervical lymphadenopathy, No pre or post-auricular lymphadenopathy.  Cranial Nerves/Neuro: Function II through XII - normal. No focal deficits noted.  Nasal endoscopy was indicated to better evaluate remainder of the nasal cavity and paranasal sinuses given the patient's  history and exam findings and is detailed below.  PROCEDURE: Nasal endoscopy and flexible laryngoscopy 31575 Anesthesia: Lidocaine 4% with Phenylephrine topical anesthetic was placed. Description of Procedure: After obtaining consent, a 30 degree rigid endoscope was utilized to evaluate the sinonasal cavities including the middle and superior meatus, the turbinates, and the spheno-ethmoidal recess.  Right anterior nasal septal deviation changing to left sided deviation behind the caudal strut. Inferior turbinates 2+. Right middle meatus with nasal polyps. Left middle meatus polyposis present.  RIGHT:  Blakesly forceps and suction were used to remove crusts, mucous, and and retained secretions. Middle turbinates in good position. Mild to moderate mucosal edema. All operated sinus ostia are patent.  Flexible Fiberoptic Laryngoscopy Indication for procedure: Flexible laryngoscopy was indicated for hoarseness After verbal consent was obtained, 4% lidocaine and oxymetazoline was sprayed into the patient's bilateral nasal cavities. A flexible fiberoptic laryngoscope was passed through the patient's right naris down to the level of the cords.  Nasopharynx: The eustachian tube orifice and fossa of Rossenmuller are normal. There are no masses or lesions. Oropharynx & Hypopharynx: The base of tongue, vallecula, pyriform sinuses, and post-cricoid area are normal. There are no masses or lesions. There is no pooling of secretions. Larynx: Mucous stranding present. 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 The a-e folds, arytenoids, false cords are normal. The vocal cords were mobile bilaterally with abduction and good adduction. Subglottis: Evaluation of the subglottis on flexible nasopharyngoscopy in an awake patient can be unreliable, but the subglottis was visualized below the cords and the trachea appeared normal with no evidence  of stenosis.  IMAGING/RESULTS - Reviewed in  detail as follows: None  MEDICAL DECISION MAKING IMPRESSION: 1. Chronic ethmoidal sinusitis 2. Nasal polyps 3. 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 Prescription sent. In addition CT sinus face ordered to further evaluate for mucosal thickening in patient's sinus cavities. Nasal polyps are present but nonobstructive. CRS may be contributing to patient's ethmoidal headaches. Will contact patient once imaging completed to discuss role for surgery. Would be reasonable to trial 2-3 "month course of budesonide to determine if symptoms improve first.  Regarding patient's left true vocal fold lesion malignancy must be ruled out. I have consulted with my colleague Dr. Constance Holster who is scheduling the patient for [facility] laryngoscopy with excisional biopsy. Risk discussed including pain, bleeding, hoarseness, recurrence, need for further procedures. Dr. Constance Holster surgery scheduler will arrange outpatient procedure.  There was copious laryngeal mucous present on exam suggestive of irritation from possible acid reflux/LPR. I have recommended a trial of BID PPI as well as dietary and lifestyle modifications to reduce acid production. Patient will also f/u with his Gastroenterologist (Had EGD earlier this year)..  I will follow-up the patient for his sinus issues as stated above.  Orders Placed This Encounter Procedures  ENTCT FULL AXIAL OR CORONAL  Orders Placed This Encounter Medications  budesonide (PULMICORT) 0.5 mg/2 mL nebulizer solution Sig: Add 2 mL (1 vial) of budesonide into 240 mL saline irrigation bottle and irrigate both nostrils morning and night (one bottle total per day). Dispense: 60 mL Refill: 11

## 2022-11-07 ENCOUNTER — Other Ambulatory Visit: Payer: Self-pay

## 2022-11-07 ENCOUNTER — Other Ambulatory Visit: Payer: Medicare Other

## 2022-11-07 MED ORDER — HYDROCHLOROTHIAZIDE 12.5 MG PO TABS: 12.5000 mg | ORAL_TABLET | Freq: Every day | ORAL | 3 refills | Status: DC

## 2022-11-09 ENCOUNTER — Ambulatory Visit: Payer: Medicare Other | Admitting: Cardiovascular Disease

## 2022-11-10 ENCOUNTER — Telehealth: Payer: Self-pay | Admitting: Home Health

## 2022-11-10 DIAGNOSIS — I4819 Other persistent atrial fibrillation: Secondary | ICD-10-CM

## 2022-11-10 NOTE — Telephone Encounter (Signed)
[facility] called after hour line report patient's final event monitor report:    Slow A fib:  average HR 34 bpm lasted 60 seconds, 10/26/22 543am Est   Symptomatic Bradycardia: average HR 40 lasted 30 seconds, 10/26/22 643am Est   Symptomatic Bradycardia:  average HR 36 bpm lasted 30 second, 10/28/22, 620AM Est   100% A fib burden    He has been considered for EP referral from last office visit with Dr Excell Seltzer.  Called patient 11/10/22, he recalls feeling dizzy for less than a minute few times while HR dropped low. He is feeling well now.  Advised patient go to the ER if become persistently dizzy, having presyncope/syncope, or having chest discomfort.  Otherwise, message sent to EP staff to arrange appointment for PPM evaluation.  Patient is agreeable.

## 2022-11-10 NOTE — Telephone Encounter (Signed)
[facility] called after hour line report patient's final event monitor report:   Slow A fib:  average HR 34 bpm lasted 60 seconds, 10/26/22 543am Est  Symptomatic Bradycardia: average HR 40 lasted 30 seconds, 10/26/22 643am Est  Symptomatic Bradycardia:  average HR 36 bpm lasted 30 second, 10/28/22, 620AM Est  100% A fib burden   He has been considered for EP referral from last office visit.

## 2022-11-12 ENCOUNTER — Ambulatory Visit (HOSPITAL_BASED_OUTPATIENT_CLINIC_OR_DEPARTMENT_OTHER): Payer: Medicare Other | Admitting: Certified Registered"

## 2022-11-12 ENCOUNTER — Ambulatory Visit (HOSPITAL_BASED_OUTPATIENT_CLINIC_OR_DEPARTMENT_OTHER)
Admission: RE | Admit: 2022-11-12 | Discharge: 2022-11-12 | Disposition: A | Discharge status: Home / Self Care | Payer: Medicare Other | Attending: Otolaryngology | Admitting: Otolaryngology

## 2022-11-12 ENCOUNTER — Encounter (HOSPITAL_BASED_OUTPATIENT_CLINIC_OR_DEPARTMENT_OTHER): Payer: Self-pay | Admitting: Otolaryngology

## 2022-11-12 ENCOUNTER — Encounter (HOSPITAL_BASED_OUTPATIENT_CLINIC_OR_DEPARTMENT_OTHER)
Admission: RE | Disposition: A | Discharge status: Home / Self Care | Payer: Self-pay | Source: Home / Self Care | Attending: Otolaryngology

## 2022-11-12 DIAGNOSIS — I10 Essential (primary) hypertension: Secondary | ICD-10-CM | POA: Diagnosis not present

## 2022-11-12 DIAGNOSIS — Z87891 Personal history of nicotine dependence: Secondary | ICD-10-CM

## 2022-11-12 DIAGNOSIS — I700 Atherosclerosis of aorta: Secondary | ICD-10-CM | POA: Diagnosis not present

## 2022-11-12 DIAGNOSIS — I4891 Unspecified atrial fibrillation: Secondary | ICD-10-CM | POA: Insufficient documentation

## 2022-11-12 DIAGNOSIS — J439 Emphysema, unspecified: Secondary | ICD-10-CM | POA: Diagnosis not present

## 2022-11-12 DIAGNOSIS — J339 Nasal polyp, unspecified: Secondary | ICD-10-CM | POA: Insufficient documentation

## 2022-11-12 DIAGNOSIS — J322 Chronic ethmoidal sinusitis: Secondary | ICD-10-CM | POA: Diagnosis not present

## 2022-11-12 DIAGNOSIS — K219 Gastro-esophageal reflux disease without esophagitis: Secondary | ICD-10-CM | POA: Diagnosis not present

## 2022-11-12 DIAGNOSIS — J383 Other diseases of vocal cords: Secondary | ICD-10-CM

## 2022-11-12 DIAGNOSIS — Z79899 Other long term (current) drug therapy: Secondary | ICD-10-CM | POA: Diagnosis not present

## 2022-11-12 DIAGNOSIS — Z7901 Long term (current) use of anticoagulants: Secondary | ICD-10-CM | POA: Diagnosis not present

## 2022-11-12 DIAGNOSIS — J382 Nodules of vocal cords: Secondary | ICD-10-CM | POA: Insufficient documentation

## 2022-11-12 DIAGNOSIS — Z01818 Encounter for other preprocedural examination: Secondary | ICD-10-CM

## 2022-11-12 HISTORY — DX: Other diseases of vocal cords: J38.3

## 2022-11-12 HISTORY — PX: MICROLARYNGOSCOPY WITH CO2 LASER AND EXCISION OF VOCAL CORD LESION: SHX5970

## 2022-11-12 SURGERY — MICROLARYNGOSCOPY WITH CO2 LASER AND EXCISION OF VOCAL CORD LESION
Anesthesia: General | Site: Throat

## 2022-11-12 MED ORDER — PROPOFOL 10 MG/ML IV BOLUS
INTRAVENOUS | Status: AC
  Filled 2022-11-12: qty 20

## 2022-11-12 MED ORDER — LIDOCAINE-EPINEPHRINE 1 %-1:100000 IJ SOLN
INTRAMUSCULAR | Status: DC | PRN
  Administered 2022-11-12: .4 mL

## 2022-11-12 MED ORDER — EPINEPHRINE PF 1 MG/ML IJ SOLN
INTRAMUSCULAR | Status: AC
  Filled 2022-11-12: qty 1

## 2022-11-12 MED ORDER — FENTANYL CITRATE (PF) 100 MCG/2ML IJ SOLN
INTRAMUSCULAR | Status: DC | PRN
  Administered 2022-11-12: 50 ug via INTRAVENOUS

## 2022-11-12 MED ORDER — PROPOFOL 10 MG/ML IV BOLUS
INTRAVENOUS | Status: DC | PRN
  Administered 2022-11-12: 120 mg via INTRAVENOUS

## 2022-11-12 MED ORDER — SUCCINYLCHOLINE CHLORIDE 200 MG/10ML IV SOSY
PREFILLED_SYRINGE | INTRAVENOUS | Status: DC | PRN
  Administered 2022-11-12: 140 mg via INTRAVENOUS

## 2022-11-12 MED ORDER — ACETAMINOPHEN 10 MG/ML IV SOLN
INTRAVENOUS | Status: AC
  Filled 2022-11-12: qty 100

## 2022-11-12 MED ORDER — EPHEDRINE SULFATE (PRESSORS) 50 MG/ML IJ SOLN
INTRAMUSCULAR | Status: DC | PRN
  Administered 2022-11-12: 10 mg via INTRAVENOUS

## 2022-11-12 MED ORDER — ACETAMINOPHEN 10 MG/ML IV SOLN
1000.0000 mg | Freq: Once | INTRAVENOUS | Status: DC | PRN
  Administered 2022-11-12: 1000 mg via INTRAVENOUS

## 2022-11-12 MED ORDER — SUCCINYLCHOLINE CHLORIDE 200 MG/10ML IV SOSY
PREFILLED_SYRINGE | INTRAVENOUS | Status: AC
  Filled 2022-11-12: qty 10

## 2022-11-12 MED ORDER — DEXAMETHASONE SODIUM PHOSPHATE 10 MG/ML IJ SOLN
INTRAMUSCULAR | Status: DC | PRN
  Administered 2022-11-12: 10 mg via INTRAVENOUS

## 2022-11-12 MED ORDER — ARTIFICIAL TEARS OPHTHALMIC OINT
TOPICAL_OINTMENT | OPHTHALMIC | Status: AC
  Filled 2022-11-12: qty 3.5

## 2022-11-12 MED ORDER — OXYCODONE HCL 5 MG/5ML PO SOLN: 5.0000 mg | Freq: Once | ORAL | Status: DC | PRN

## 2022-11-12 MED ORDER — ACETAMINOPHEN 325 MG PO TABS: 325.0000 mg | ORAL_TABLET | ORAL | Status: DC | PRN

## 2022-11-12 MED ORDER — GLYCOPYRROLATE PF 0.2 MG/ML IJ SOSY
PREFILLED_SYRINGE | INTRAMUSCULAR | Status: AC
  Filled 2022-11-12: qty 1

## 2022-11-12 MED ORDER — FENTANYL CITRATE (PF) 100 MCG/2ML IJ SOLN
INTRAMUSCULAR | Status: AC
  Filled 2022-11-12: qty 2

## 2022-11-12 MED ORDER — EPHEDRINE 5 MG/ML INJ
INTRAVENOUS | Status: AC
  Filled 2022-11-12: qty 5

## 2022-11-12 MED ORDER — LACTATED RINGERS IV SOLN: INTRAVENOUS | Status: DC

## 2022-11-12 MED ORDER — LIDOCAINE 2% (20 MG/ML) 5 ML SYRINGE
INTRAMUSCULAR | Status: DC | PRN
  Administered 2022-11-12: 40 mg via INTRAVENOUS

## 2022-11-12 MED ORDER — ONDANSETRON HCL 4 MG/2ML IJ SOLN
INTRAMUSCULAR | Status: AC
  Filled 2022-11-12: qty 2

## 2022-11-12 MED ORDER — DEXAMETHASONE SODIUM PHOSPHATE 10 MG/ML IJ SOLN
INTRAMUSCULAR | Status: AC
  Filled 2022-11-12: qty 1

## 2022-11-12 MED ORDER — FENTANYL CITRATE (PF) 100 MCG/2ML IJ SOLN
25.0000 ug | INTRAMUSCULAR | Status: DC | PRN
  Administered 2022-11-12: 25 ug via INTRAVENOUS

## 2022-11-12 MED ORDER — EPINEPHRINE PF 1 MG/ML IJ SOLN
INTRAMUSCULAR | Status: DC | PRN
  Administered 2022-11-12: 2 mg

## 2022-11-12 MED ORDER — ONDANSETRON HCL 4 MG/2ML IJ SOLN
INTRAMUSCULAR | Status: DC | PRN
  Administered 2022-11-12: 4 mg via INTRAVENOUS

## 2022-11-12 MED ORDER — AMISULPRIDE (ANTIEMETIC) 5 MG/2ML IV SOLN: 10.0000 mg | Freq: Once | INTRAVENOUS | Status: DC | PRN

## 2022-11-12 MED ORDER — LIDOCAINE 2% (20 MG/ML) 5 ML SYRINGE
INTRAMUSCULAR | Status: AC
  Filled 2022-11-12: qty 5

## 2022-11-12 MED ORDER — OXYCODONE HCL 5 MG PO TABS: 5.0000 mg | ORAL_TABLET | Freq: Once | ORAL | Status: DC | PRN

## 2022-11-12 MED ORDER — ACETAMINOPHEN 160 MG/5ML PO SOLN: 325.0000 mg | ORAL | Status: DC | PRN

## 2022-11-12 SURGICAL SUPPLY — 25 items
CANISTER SUCT 1200ML W/VALVE (MISCELLANEOUS) ×2 IMPLANT
DEFOGGER MIRROR 1QT (MISCELLANEOUS) ×2 IMPLANT
GAUZE SPONGE 4X4 12PLY STRL LF (GAUZE/BANDAGES/DRESSINGS) ×4 IMPLANT
GLOVE ECLIPSE 7.5 STRL STRAW (GLOVE) ×2 IMPLANT
GOWN STRL REUS W/ TWL LRG LVL3 (GOWN DISPOSABLE) IMPLANT
GOWN STRL REUS W/ TWL XL LVL3 (GOWN DISPOSABLE) IMPLANT
GOWN STRL REUS W/TWL LRG LVL3 (GOWN DISPOSABLE)
GOWN STRL REUS W/TWL XL LVL3 (GOWN DISPOSABLE)
GUARD TEETH (MISCELLANEOUS) IMPLANT
MARKER SKIN DUAL TIP RULER LAB (MISCELLANEOUS) IMPLANT
NDL HYPO 18GX1.5 BLUNT FILL (NEEDLE) ×2 IMPLANT
NDL SPNL 22GX7 QUINCKE BK (NEEDLE) IMPLANT
NEEDLE HYPO 18GX1.5 BLUNT FILL (NEEDLE) ×1 IMPLANT
NEEDLE SPNL 22GX7 QUINCKE BK (NEEDLE) IMPLANT
NS IRRIG 1000ML POUR BTL (IV SOLUTION) ×2 IMPLANT
PACK BASIN DAY SURGERY FS (CUSTOM PROCEDURE TRAY) ×2 IMPLANT
PATTIES SURGICAL .5 X3 (DISPOSABLE) ×2 IMPLANT
SHEET MEDIUM DRAPE 40X70 STRL (DRAPES) ×2 IMPLANT
SLEEVE SCD COMPRESS KNEE MED (STOCKING) IMPLANT
SURGILUBE 2OZ TUBE FLIPTOP (MISCELLANEOUS) IMPLANT
SYR 5ML LL (SYRINGE) ×2 IMPLANT
SYR CONTROL 10ML LL (SYRINGE) IMPLANT
SYR TB 1ML LL NO SAFETY (SYRINGE) IMPLANT
TOWEL GREEN STERILE FF (TOWEL DISPOSABLE) ×2 IMPLANT
TUBE CONNECTING 20X1/4 (TUBING) ×4 IMPLANT

## 2022-11-12 NOTE — Interval H&P Note (Signed)
History and Physical Interval Note:  11/12/2022 9:14 AM  Leroy Lara  has presented today for surgery, with the diagnosis of Lesion of true vocal cord.  The various methods of treatment have been discussed with the patient and family. After consideration of risks, benefits and other options for treatment, the patient has consented to  Procedure(s): MICROLARYNGOSCOPY WITH EXCISION OF VOCAL CORD LESION (N/A) as a surgical intervention.  The patient's history has been reviewed, patient examined, no change in status, stable for surgery.  I have reviewed the patient's chart and labs.  Questions were answered to the patient's satisfaction.     [name]

## 2022-11-12 NOTE — Telephone Encounter (Signed)
Would go ahead and refer to EP thanks. Pt with longstanding slow AF but seems to be getting worse.

## 2022-11-12 NOTE — Op Note (Signed)
OPERATIVE REPORT  DATE OF SURGERY: 11/12/2022  PATIENT:  Isaac Laud,  76 y.o. male  PRE-OPERATIVE DIAGNOSIS:  Lesion of true vocal cord  POST-OPERATIVE DIAGNOSIS:  Lesion of true vocal cord  PROCEDURE:  Procedure(s): MICROLARYNGOSCOPY WITH EXCISION OF VOCAL CORD LESION  SURGEON:  Susy Frizzle, MD  ASSISTANTS: none  ANESTHESIA:   General   EBL: Less than 5 ml  DRAINS: none  LOCAL MEDICATIONS USED: 1% Xylocaine with epinephrine  SPECIMEN: Left anterior vocal cord lesion  COUNTS:  Correct  PROCEDURE DETAILS: The patient was taken to the operating room and placed on the operating table in the supine position. Following induction of general endotracheal anesthesia, the table was turned 90 and the patient was draped in a standard fashion.  The Jako laryngoscope was entered into the oral cavity used to visualize the larynx.  This was attached to the Mayo stand with the suspension apparatus.  The larynx was normal to appearance except for the left anterior cord lesion that was papillary in shape, mildly pedunculated and measures approximately 3 mm.  The surrounding mucosa was infiltrated with a spinal needle using lidocaine with epinephrine.  Angled microlaryngoscopy scissors were used to remove the entire lesion.  Hemostasis was completed using topical adrenaline on pledgets.  No other lesions were identified.  This was sent for pathologic evaluation.  The cord itself was preserved.  Simply mucosal resection was accomplished.    PATIENT DISPOSITION:  To PACU, stable

## 2022-11-12 NOTE — Telephone Encounter (Signed)
Referral placed to EP at this time and routed to EP scheduler.

## 2022-11-12 NOTE — Addendum Note (Signed)
Addended by: Lars Mage on: 11/12/2022 04:10 PM   Modules accepted: Orders

## 2022-11-12 NOTE — Discharge Instructions (Addendum)
Resume regular diet and activity.  Avoid straining the voice, such as screaming, singing, whispering.  Regular talking in a comfortable voice is okay.   Post Anesthesia Home Care Instructions  Activity: Get plenty of rest for the remainder of the day. A responsible individual must stay with you for 24 hours following the procedure.  For the next 24 hours, DO NOT: -Drive a car -Advertising copywriter -Drink alcoholic beverages -Take any medication unless instructed by your physician -Make any legal decisions or sign important papers.  Meals: Start with liquid foods such as gelatin or soup. Progress to regular foods as tolerated. Avoid greasy, spicy, heavy foods. If nausea and/or vomiting occur, drink only clear liquids until the nausea and/or vomiting subsides. Call your physician if vomiting continues.  Special Instructions/Symptoms: Your throat may feel dry or sore from the anesthesia or the breathing tube placed in your throat during surgery. If this causes discomfort, gargle with warm salt water. The discomfort should disappear within 24 hours.  If you had a scopolamine patch placed behind your ear for the management of post- operative nausea and/or vomiting:  1. The medication in the patch is effective for 72 hours, after which it should be removed.  Wrap patch in a tissue and discard in the trash. Wash hands thoroughly with soap and water. 2. You may remove the patch earlier than 72 hours if you experience unpleasant side effects which may include dry mouth, dizziness or visual disturbances. 3. Avoid touching the patch. Wash your hands with soap and water after contact with the patch.

## 2022-11-12 NOTE — Transfer of Care (Signed)
Immediate Anesthesia Transfer of Care Note  Patient: Leroy Lara  Procedure(s) Performed: MICROLARYNGOSCOPY WITH EXCISION OF VOCAL CORD LESION (Throat)  Patient Location: PACU  Anesthesia Type:General  Level of Consciousness: drowsy  Airway & Oxygen Therapy: Patient Spontanous Breathing and Patient connected to face mask oxygen  Post-op Assessment: Report given to RN and Post -op Vital signs reviewed and stable  Post vital signs: Reviewed and stable  Last Vitals:   BP: 149/63 (88) Vitals Value Taken Time  BP    Temp    Pulse 51 11/12/22 1021  Resp 16 11/12/22 1021  SpO2 100 % 11/12/22 1021  Vitals shown include unvalidated device data.  Last Pain:  Vitals:   11/12/22 0803  TempSrc: Oral  PainSc: 0-No pain      Patients Stated Pain Goal: 3 (11/12/22 0803)  Complications: No notable events documented.

## 2022-11-12 NOTE — Anesthesia Procedure Notes (Signed)
Procedure Name: Intubation Date/Time: 11/12/2022 9:47 AM  Performed by: Lavonia Dana, CRNAPre-anesthesia Checklist: Patient identified, Emergency Drugs available, Suction available and Patient being monitored Patient Re-evaluated:Patient Re-evaluated prior to induction Oxygen Delivery Method: Circle system utilized Preoxygenation: Pre-oxygenation with 100% oxygen Induction Type: IV induction Ventilation: Mask ventilation without difficulty Laryngoscope Size: Mac and 4 Grade View: Grade I Tube type: Oral Tube size: 6.5 mm Number of attempts: 1 Airway Equipment and Method: Stylet and Bite block Placement Confirmation: ETT inserted through vocal cords under direct vision, positive ETCO2 and breath sounds checked- equal and bilateral Secured at: 24 cm Tube secured with: Tape Dental Injury: Teeth and Oropharynx as per pre-operative assessment

## 2022-11-12 NOTE — Anesthesia Preprocedure Evaluation (Addendum)
Anesthesia Evaluation  Patient identified by MRN, date of birth, ID band Patient awake    Reviewed: Allergy & Precautions, NPO status , Patient's Chart, lab work & pertinent test results  Airway Mallampati: I  TM Distance: >3 FB Neck ROM: Full    Dental  (+) Partial Upper, Dental Advisory Given   Pulmonary former smoker   breath sounds clear to auscultation       Cardiovascular hypertension, Pt. on medications + dysrhythmias Atrial Fibrillation  Rhythm:Irregular Rate:Normal  Echo:  1. Left ventricular ejection fraction, by estimation, is 50 to 55%. The  left ventricle has low normal function. The left ventricle has no regional  wall motion abnormalities. Left ventricular diastolic parameters are  indeterminate.   2. Right ventricular systolic function is normal. The right ventricular  size is normal. There is mildly elevated pulmonary artery systolic  pressure. The estimated right ventricular systolic pressure is 38.7 mmHg.   3. Left atrial size was moderately dilated.   4. Right atrial size was moderately dilated.   5. The mitral valve is normal in structure. Mild mitral valve  regurgitation. No evidence of mitral stenosis.   6. The aortic valve is tricuspid. There is mild calcification of the  aortic valve. Aortic valve regurgitation is trivial. No aortic stenosis is  present.   7. The inferior vena cava is dilated in size with >50% respiratory  variability, suggesting right atrial pressure of 8 mmHg.     Neuro/Psych  Neuromuscular disease  negative psych ROS   GI/Hepatic Neg liver ROS,GERD  Medicated,,  Endo/Other  negative endocrine ROS    Renal/GU negative Renal ROS     Musculoskeletal negative musculoskeletal ROS (+)    Abdominal   Peds  Hematology negative hematology ROS (+)   Anesthesia Other Findings   Reproductive/Obstetrics                             Anesthesia  Physical Anesthesia Plan  ASA: 3  Anesthesia Plan: General   Post-op Pain Management:    Induction: Intravenous  PONV Risk Score and Plan: 3 and Ondansetron, Dexamethasone and Midazolam  Airway Management Planned: Oral ETT  Additional Equipment: None  Intra-op Plan:   Post-operative Plan: Extubation in OR  Informed Consent: I have reviewed the patients History and Physical, chart, labs and discussed the procedure including the risks, benefits and alternatives for the proposed anesthesia with the patient or authorized representative who has indicated his/her understanding and acceptance.     Dental advisory given  Plan Discussed with: CRNA  Anesthesia Plan Comments:        Anesthesia Quick Evaluation

## 2022-11-12 NOTE — Anesthesia Postprocedure Evaluation (Signed)
Anesthesia Post Note  Patient: Leroy Lara  Procedure(s) Performed: MICROLARYNGOSCOPY WITH EXCISION OF VOCAL CORD LESION (Throat)     Patient location during evaluation: PACU Anesthesia Type: General Level of consciousness: awake and alert Pain management: pain level controlled Vital Signs Assessment: post-procedure vital signs reviewed and stable Respiratory status: spontaneous breathing, nonlabored ventilation, respiratory function stable and patient connected to nasal cannula oxygen Cardiovascular status: blood pressure returned to baseline and stable Postop Assessment: no apparent nausea or vomiting Anesthetic complications: no   No notable events documented.  Last Vitals:  Vitals:   11/12/22 1045 11/12/22 1100  BP: 139/66 (!) 161/81  Pulse: (!) 40 (!) 45  Resp: 18 16  Temp:  37.5 C  SpO2: 95% 93%    Last Pain:  Vitals:   11/12/22 1100  TempSrc:   PainSc: 3                  Shelton Silvas

## 2022-11-13 ENCOUNTER — Encounter (HOSPITAL_BASED_OUTPATIENT_CLINIC_OR_DEPARTMENT_OTHER): Payer: Self-pay | Admitting: Otolaryngology

## 2022-11-14 LAB — SURGICAL PATHOLOGY

## 2022-12-13 ENCOUNTER — Institutional Professional Consult (permissible substitution): Payer: Medicare Other | Admitting: Cardiology

## 2022-12-24 ENCOUNTER — Encounter: Payer: Self-pay | Admitting: Cardiovascular Disease

## 2023-01-21 ENCOUNTER — Encounter: Payer: Self-pay | Admitting: Cardiology

## 2023-01-21 ENCOUNTER — Ambulatory Visit: Payer: Medicare Other | Attending: Cardiology | Admitting: Cardiology

## 2023-01-21 VITALS — BP 128/70 | HR 50 | Ht 72.0 in | Wt 189.0 lb

## 2023-01-21 DIAGNOSIS — D6869 Other thrombophilia: Secondary | ICD-10-CM

## 2023-01-21 DIAGNOSIS — I4811 Longstanding persistent atrial fibrillation: Secondary | ICD-10-CM

## 2023-01-21 DIAGNOSIS — I2510 Atherosclerotic heart disease of native coronary artery without angina pectoris: Secondary | ICD-10-CM

## 2023-01-21 DIAGNOSIS — I10 Essential (primary) hypertension: Secondary | ICD-10-CM | POA: Diagnosis not present

## 2023-01-21 NOTE — Progress Notes (Signed)
Electrophysiology Office Note   Date:  01/21/2023   ID:  Leroy Lara, Leroy Lara [birth year], MRN [id number]  PCP:  Aletha Halim., PA-C  Cardiologist:  Burt Knack Primary Electrophysiologist:  Aaleah Hirsch Meredith Leeds, MD    Chief Complaint: AF   History of Present Illness: Leroy Lara is a 77 y.o. male who is being seen today for the evaluation of AF at the request of Sherren Mocha, MD. Presenting today for electrophysiology evaluation.  He has a history significant for atrial fibrillation with slow response, hypertension, abdominal aortic aneurysm, nonobstructive carotid stenosis.  Patient has been noticing heart rates in the 30s to 40s.  He has not had any syncope or presyncope but does state that he feels more fatigued.  He has also been more short of breath.  He continues to be quite active.  Today, he denies symptoms of palpitations, chest pain, shortness of breath, orthopnea, PND, lower extremity edema, claudication, dizziness, presyncope, syncope, bleeding, or neurologic sequela. The patient is tolerating medications without difficulties.    Past Medical History:  Diagnosis Date   AAA (abdominal aortic aneurysm) (HCC)    Atrial fibrillation, permanent (HCC)    Carotid artery calcification, bilateral    Essential hypertension    Gastroesophageal reflux disease    Vocal cord mass    Past Surgical History:  Procedure Laterality Date   MICROLARYNGOSCOPY WITH CO2 LASER AND EXCISION OF VOCAL CORD LESION N/A 11/12/2022   Procedure: MICROLARYNGOSCOPY WITH EXCISION OF VOCAL CORD LESION;  Surgeon: Izora Gala, MD;  Location: [facility];  Service: ENT;  Laterality: N/A;     Current Outpatient Medications  Medication Sig Dispense Refill   budesonide (PULMICORT) 0.5 MG/2ML nebulizer solution Take 0.5 mg by nebulization 2 (two) times daily.     clobetasol ointment (TEMOVATE) 0.05 %      ELIQUIS 5 MG TABS tablet TAKE 1 TABLET TWICE A DAY 180 tablet 3    hydrochlorothiazide (HYDRODIURIL) 12.5 MG tablet Take 1 tablet (12.5 mg total) by mouth daily. 90 tablet 3   losartan (COZAAR) 100 MG tablet Take 1 tablet (100 mg total) by mouth daily. 90 tablet 2   omeprazole (PRILOSEC) 20 MG capsule Take 20 mg by mouth daily.     OXISTAT 1 % lotion as needed.     rosuvastatin (CRESTOR) 20 MG tablet Take 1 tablet (20 mg total) by mouth daily. 90 tablet 2   No current facility-administered medications for this visit.    Allergies:   Sulfamethoxazole, Sulfasalazine, and Sulfonamide derivatives   Social History:  The patient  reports that he has quit smoking. He has never used smokeless tobacco. He reports that he does not drink alcohol and does not use drugs.   Family History:  The patient's family history includes Cancer - Lung in his father; Coronary artery disease (age of onset: 21) in his mother; Heart failure in his sister; Lymphoma (age of onset: 3) in his brother; Pulmonary fibrosis in his brother.    ROS:  Please see the history of present illness.   Otherwise, review of systems is positive for none.   All other systems are reviewed and negative.    PHYSICAL EXAM: VS:  BP 128/70   Pulse (!) 50   Ht 6' (1.829 m)   Wt 189 lb (85.7 kg)   SpO2 98%   BMI 25.63 kg/m  , BMI Body mass index is 25.63 kg/m. GEN: Well nourished, well developed, in no acute distress  HEENT: normal  Neck: no JVD, carotid bruits, or masses Cardiac: Irregular; no murmurs, rubs, or gallops,no edema  Respiratory:  clear to auscultation bilaterally, normal work of breathing GI: soft, nontender, nondistended, + BS MS: no deformity or atrophy  Skin: warm and dry Neuro:  Strength and sensation are intact Psych: euthymic mood, full affect  EKG:  EKG is ordered today. Personal review of the ekg ordered shows AF, rate 50   Recent Labs: 03/30/2022: Hemoglobin 13.1; Platelets 198 10/16/2022: BUN 16; Creatinine, Ser 1.02; Potassium 4.4; Sodium 142    Lipid Panel      Component Value Date/Time   CHOL 125 02/20/2021 0824   TRIG 84 02/20/2021 0824   HDL 45 02/20/2021 0824   CHOLHDL 2.8 02/20/2021 0824   CHOLHDL 3.0 01/10/2016 0759   VLDL 19 01/10/2016 0759   LDLCALC 64 02/20/2021 0824   LDLDIRECT 154.8 08/26/2013 0739     Wt Readings from Last 3 Encounters:  01/21/23 189 lb (85.7 kg)  11/12/22 177 lb 11.1 oz (80.6 kg)  10/22/22 182 lb 6.4 oz (82.7 kg)      Other studies Reviewed: Additional studies/ records that were reviewed today include: TTE 04/05/22  Review of the above records today demonstrates:   1. Left ventricular ejection fraction, by estimation, is 50 to 55%. The  left ventricle has low normal function. The left ventricle has no regional  wall motion abnormalities. Left ventricular diastolic parameters are  indeterminate.   2. Right ventricular systolic function is normal. The right ventricular  size is normal. There is mildly elevated pulmonary artery systolic  pressure. The estimated right ventricular systolic pressure is 123XX123 mmHg.   3. Left atrial size was moderately dilated.   4. Right atrial size was moderately dilated.   5. The mitral valve is normal in structure. Mild mitral valve  regurgitation. No evidence of mitral stenosis.   6. The aortic valve is tricuspid. There is mild calcification of the  aortic valve. Aortic valve regurgitation is trivial. No aortic stenosis is  present.   7. The inferior vena cava is dilated in size with >50% respiratory  variability, suggesting right atrial pressure of 8 mmHg.   Cardiac monitor 11/22/2022 personally reviewed monitor shows slow atrial fibrillation with average HR 49 bpm. At the slowest heart rates with ventricular rates in the 30's, the rhythm appears to be a competing junctional pacemaker. Occasional PVC's noted. No tachyarrhythmias.   ASSESSMENT AND PLAN:  1.  Longstanding persistent atrial fibrillation:.  Currently on Eliquis.  Zio monitor shows present atrial  fibrillation with an average heart rate of 49 bpm.  He feels weak and fatigued.  I told him that it is certainly possible that his slow ventricular responses affecting his level of energy.  He understands that a pacemaker may give him some benefit, with less weakness and fatigue.  I Anastyn Ayars discuss this further with his primary cardiologist.  2.  Hypertension: Currently well-controlled  3.  Secondary hypercoagulable state: Currently on Eliquis for atrial fibrillation as above  4.  Coronary artery disease: Nonflow limiting stenosis.  No current chest pain.  Plan per primary cardiology.  Current medicines are reviewed at length with the patient today.   The patient does not have concerns regarding his medicines.  The following changes were made today:  none  Labs/ tests ordered today include: \ Orders Placed This Encounter  Procedures   EKG 12-Lead     Disposition:   FU with Glade Strausser 3 months  Signed, Macall Mccroskey Meredith Leeds,  MD  01/21/2023 1:45 PM     [address]  [place] [id number] [phone] (office) [phone] (fax)

## 2023-01-21 NOTE — Patient Instructions (Signed)
Medication Instructions:  Your physician recommends that you continue on your current medications as directed. Please refer to the Current Medication list given to you today.  *If you need a refill on your cardiac medications before your next appointment, please call your pharmacy*   Lab Work: None ordered   Testing/Procedures: None ordered   Follow-Up: At [facility], you and your health needs are our priority.  As part of our continuing mission to provide you with exceptional heart care, we have created designated Provider Care Teams.  These Care Teams include your primary Cardiologist (physician) and Advanced Practice Providers (APPs -  Physician Assistants and Nurse Practitioners) who all work together to provide you with the care you need, when you need it.  Your next appointment:   To be  determined after Dr. Curt Bears discusses your case with Dr. Burt Knack.  We will be in touch once they do  The format for your next appointment:   In Person  Provider:   Allegra Lai, MD    Thank you for choosing [facility]!!   Trinidad Curet, RN [phone]

## 2023-01-30 ENCOUNTER — Telehealth: Payer: Self-pay | Admitting: *Deleted

## 2023-01-30 NOTE — Telephone Encounter (Signed)
Pt aware Dr. Curt Bears and Dr. Burt Knack did discuss case. Pt informed that Dr. Curt Bears wants pt to understand that we are unsure if PPM will change how he feels or guarantee that it will give him more energy.   Offered PPM implant if pt prefers though.  Pt reports moments of dizziness, but this has been ongoing for some time. Reports that he typically runs bradycardic as well. Pt prefers to hold off on any decisions just yet.  States that he sees Dr. Burt Knack 5/1 and will further discuss this with him at that appointment. Pt will call me back if things change and he would like to schedule PPM implant, otherwise will readdress if proceeding once he sees/discusses w/ Dr. Burt Knack in couple of months.  Pt appreciates my call and speaking with him.

## 2023-03-26 ENCOUNTER — Ambulatory Visit (HOSPITAL_COMMUNITY): Payer: Medicare Other | Attending: Internal Medicine

## 2023-03-26 DIAGNOSIS — E782 Mixed hyperlipidemia: Secondary | ICD-10-CM | POA: Diagnosis present

## 2023-03-26 DIAGNOSIS — I714 Abdominal aortic aneurysm, without rupture, unspecified: Secondary | ICD-10-CM | POA: Diagnosis present

## 2023-03-26 DIAGNOSIS — I4819 Other persistent atrial fibrillation: Secondary | ICD-10-CM | POA: Insufficient documentation

## 2023-03-26 DIAGNOSIS — I2510 Atherosclerotic heart disease of native coronary artery without angina pectoris: Secondary | ICD-10-CM | POA: Insufficient documentation

## 2023-03-26 DIAGNOSIS — I10 Essential (primary) hypertension: Secondary | ICD-10-CM | POA: Diagnosis present

## 2023-03-26 LAB — ECHOCARDIOGRAM COMPLETE
Area-P 1/2: 3.76 cm2
Est EF: 55
S' Lateral: 3.8 cm

## 2023-04-01 ENCOUNTER — Encounter: Payer: Self-pay | Admitting: Cardiovascular Disease

## 2023-04-10 ENCOUNTER — Ambulatory Visit: Payer: Medicare Other | Attending: Cardiovascular Disease | Admitting: Cardiovascular Disease

## 2023-04-10 ENCOUNTER — Encounter: Payer: Self-pay | Admitting: Cardiovascular Disease

## 2023-04-10 VITALS — BP 100/60 | HR 56 | Ht 72.0 in | Wt 189.8 lb

## 2023-04-10 DIAGNOSIS — I10 Essential (primary) hypertension: Secondary | ICD-10-CM | POA: Insufficient documentation

## 2023-04-10 DIAGNOSIS — I714 Abdominal aortic aneurysm, without rupture, unspecified: Secondary | ICD-10-CM | POA: Insufficient documentation

## 2023-04-10 DIAGNOSIS — E782 Mixed hyperlipidemia: Secondary | ICD-10-CM | POA: Diagnosis present

## 2023-04-10 DIAGNOSIS — I2510 Atherosclerotic heart disease of native coronary artery without angina pectoris: Secondary | ICD-10-CM | POA: Diagnosis present

## 2023-04-10 DIAGNOSIS — I4811 Longstanding persistent atrial fibrillation: Secondary | ICD-10-CM | POA: Insufficient documentation

## 2023-04-10 NOTE — Progress Notes (Signed)
Cardiology Office Note:    Date:  04/10/2023   ID:  Leroy Lara, DOB February 24, 1946, MRN [id number]  PCP:  Richmond Campbell PA-C   [facility] Providers Cardiologist:  [name], MD     Referring MD: Richmond Campbell., PA-C   Chief Complaint  Patient presents with   Atrial Fibrillation    History of Present Illness:    Leroy Lara is a 77 y.o. male with a hx of permanent atrial fibrillation with slow ventricular rate, hypertension, abdominal aortic aneurysm, and mild anemia nonobstructive carotid stenosis, presenting for follow-up evaluation today.  The patient is here alone today.  He showed me a video from the local news where he went out on his canoe to rescue someone whose gl[place].  His wife's health problems have improved and she has been able to get out in the garden with him and do some work.  He has been really pleased about that.  He is doing okay on his medical therapy.  He remains on apixaban for anticoagulation without bleeding problems.  He is able to do physical work without any chest pain, chest pressure, or shortness of breath.  He does have some generalized fatigue.  He saw Dr. Elberta Fortis for consideration of a permanent pacemaker in the context of slow atrial fibrillation.  After our discussion, we have decided to continue with close monitoring and reserve pacemaker placement for progressive symptoms.  The patient otherwise is doing well with no complaints.  Past Medical History:  Diagnosis Date   AAA (abdominal aortic aneurysm) (HCC)    Atrial fibrillation, permanent (HCC)    Carotid artery calcification, bilateral    Essential hypertension    Gastroesophageal reflux disease    Vocal cord mass     Past Surgical History:  Procedure Laterality Date   MICROLARYNGOSCOPY WITH CO2 LASER AND EXCISION OF VOCAL CORD LESION N/A 11/12/2022   Procedure: MICROLARYNGOSCOPY WITH EXCISION OF VOCAL CORD LESION;  Surgeon: [name], MD;   Location: [facility];  Service: ENT;  Laterality: N/A;    Current Medications: Current Meds  Medication Sig   budesonide (PULMICORT) 0.5 MG/2ML nebulizer solution Take 0.5 mg by nebulization 2 (two) times daily.   clobetasol ointment (TEMOVATE) 0.05 %    ELIQUIS 5 MG TABS tablet TAKE 1 TABLET TWICE A DAY   hydrochlorothiazide (HYDRODIURIL) 12.5 MG tablet Take 1 tablet (12.5 mg total) by mouth daily.   Lifitegrast (XIIDRA) 5 % SOLN Apply to eye as needed.   losartan (COZAAR) 100 MG tablet Take 1 tablet (100 mg total) by mouth daily.   Multiple Vitamin (MULTI-VITAMIN) tablet Take by mouth daily.   omeprazole (PRILOSEC) 20 MG capsule Take 20 mg by mouth daily.   OXISTAT 1 % lotion as needed.   rosuvastatin (CRESTOR) 20 MG tablet Take 1 tablet (20 mg total) by mouth daily.   tamsulosin (FLOMAX) 0.4 MG CAPS capsule Take 0.4 mg by mouth daily.     Allergies:   Sulfamethoxazole, Sulfasalazine, and Sulfonamide derivatives   Social History   Socioeconomic History   Marital status: Married    Spouse name: Not on file   Number of children: Not on file   Years of education: Not on file   Highest education level: Not on file  Occupational History   Not on file  Tobacco Use   Smoking status: Former   Smokeless tobacco: Never  Vaping Use   Vaping Use: Never used  Substance and Sexual Activity   Alcohol use: Never   Drug use: Never   Sexual activity: Not on file  Other Topics Concern   Not on file  Social History Narrative   Not on file   Social Determinants of Health   Financial Resource Strain: Not on file  Food Insecurity: Not on file  Transportation Needs: Not on file  Physical Activity: Not on file  Stress: Not on file  Social Connections: Not on file     Family History: The patient's family history includes Cancer - Lung in his father; Coronary artery disease (age of onset: 14) in his mother; Heart failure in his sister; Lymphoma (age of onset: 66) in his  brother; Pulmonary fibrosis in his brother.  ROS:   Please see the history of present illness.    All other systems reviewed and are negative.  EKGs/Labs/Other Studies Reviewed:    The following studies were reviewed today: Cardiac Studies & Procedures     STRESS TESTS  EXERCISE TOLERANCE TEST (ETT) 06/19/2018  Narrative  Blood pressure demonstrated a hypertensive response to exercise.  Baseline EKG showed atrial fibrillation with LBBB.  Nondiagnostic EKG for ischemia due to underlying LBBB.  Normal chronotropic response to exercise with adequate heart rate response reaching 90% of MPHR.  Mild to moderately impaired exercise capacity at 5.6 mets.   ECHOCARDIOGRAM  ECHOCARDIOGRAM COMPLETE 03/26/2023  Narrative ECHOCARDIOGRAM REPORT    Patient Name:   Leroy Lara [facility] Date of Exam: 03/26/2023 Medical Rec #:  [id number]      Height:       72.0 in Accession #:    [id number]     Weight:       189.0 lb Date of Birth:  03/12/1946      BSA:          2.080 m Patient Age:    76 years       BP:           125/65 mmHg Patient Gender: M              HR:           55 bpm. Exam Location:  [address]  Procedure: 2D Echo, Cardiac Doppler and Color Doppler  Indications:    I48.19 Atrial Fibrillations  History:        Patient has prior history of Echocardiogram examinations, most recent 04/05/2022. CAD, AAA; Risk Factors:Hypertension and HLD.  Sonographer:    Clearence Ped RCS Referring Phys: [phone] Jaking Thayer  IMPRESSIONS   1. Left ventricular ejection fraction, by estimation, is 55%. The left ventricle has normal function. The left ventricle has no regional wall motion abnormalities. There is mild left ventricular hypertrophy. Left ventricular diastolic parameters are indeterminate. 2. Right ventricular systolic function is normal. The right ventricular size is normal. There is normal pulmonary artery systolic pressure. The estimated right ventricular systolic pressure is 25.5  mmHg. 3. Left atrial size was severely dilated. 4. Right atrial size was moderately dilated. 5. The mitral valve is grossly normal. Mild mitral valve regurgitation. No evidence of mitral stenosis. 6. The aortic valve is tricuspid. Aortic valve regurgitation is trivial. No aortic stenosis is present. 7. The inferior vena cava is normal in size with greater than 50% respiratory variability, suggesting right atrial pressure of 3 mmHg.  FINDINGS Left Ventricle: Left ventricular ejection fraction, by estimation, is 55%. The left ventricle has normal function. The left ventricle has no regional wall motion abnormalities. The left ventricular internal  cavity size was normal in size. There is mild left ventricular hypertrophy. Left ventricular diastolic parameters are indeterminate.  Right Ventricle: The right ventricular size is normal. No increase in right ventricular wall thickness. Right ventricular systolic function is normal. There is normal pulmonary artery systolic pressure. The tricuspid regurgitant velocity is 2.37 m/s, and with an assumed right atrial pressure of 3 mmHg, the estimated right ventricular systolic pressure is 25.5 mmHg.  Left Atrium: Left atrial size was severely dilated.  Right Atrium: Right atrial size was moderately dilated.  Pericardium: There is no evidence of pericardial effusion. Presence of epicardial fat layer.  Mitral Valve: The mitral valve is grossly normal. Mild mitral valve regurgitation. No evidence of mitral valve stenosis.  Tricuspid Valve: The tricuspid valve is normal in structure. Tricuspid valve regurgitation is mild . No evidence of tricuspid stenosis.  Aortic Valve: The aortic valve is tricuspid. Aortic valve regurgitation is trivial. No aortic stenosis is present.  Pulmonic Valve: The pulmonic valve was normal in structure. Pulmonic valve regurgitation is trivial. No evidence of pulmonic stenosis.  Aorta: The aortic root is normal in size and  structure.  Venous: The inferior vena cava is normal in size with greater than 50% respiratory variability, suggesting right atrial pressure of 3 mmHg.  IAS/Shunts: The atrial septum is grossly normal.   LEFT VENTRICLE PLAX 2D LVIDd:         5.60 cm   Diastology LVIDs:         3.80 cm   LV e' medial:    7.29 cm/s LV PW:         1.10 cm   LV E/e' medial:  16.0 LV IVS:        1.10 cm   LV e' lateral:   15.70 cm/s LVOT diam:     2.20 cm   LV E/e' lateral: 7.4 LV SV:         95 LV SV Index:   46 LVOT Area:     3.80 cm   RIGHT VENTRICLE RV Basal diam:  3.70 cm RV S prime:     9.57 cm/s TAPSE (M-mode): 2.5 cm RVSP:           25.5 mmHg  LEFT ATRIUM              Index        RIGHT ATRIUM           Index LA diam:        4.90 cm  2.36 cm/m   RA Pressure: 3.00 mmHg LA Vol (A2C):   75.4 ml  36.25 ml/m  RA Area:     20.90 cm LA Vol (A4C):   95.2 ml  45.77 ml/m  RA Volume:   62.50 ml  30.05 ml/m LA Biplane Vol: 101.0 ml 48.56 ml/m AORTIC VALVE LVOT Vmax:   112.33 cm/s LVOT Vmean:  74.067 cm/s LVOT VTI:    0.251 m  AORTA Ao Root diam: 3.70 cm Ao Asc diam:  3.50 cm  MITRAL VALVE                TRICUSPID VALVE MV Area (PHT):              TR Peak grad:   22.5 mmHg MV Decel Time:              TR Vmax:        237.00 cm/s MV E velocity: 116.50 cm/s  Estimated RAP:  3.00 mmHg RVSP:  25.5 mmHg  SHUNTS Systemic VTI:  0.25 m Systemic Diam: 2.20 cm  Weston Brass MD Electronically signed by Weston Brass MD Signature Date/Time: 03/26/2023/11:41:16 AM    Final    MONITORS  LONG TERM MONITOR (3-14 DAYS) 11/22/2022  Narrative Patch Wear Time:  2 days and 22 hours (2023-11-16T09:24:14-0500 to 2023-11-19T07:48:30-0500)  Atrial Fibrillation occurred continuously (100% burden), ranging from 32-95 bpm (avg of 49 bpm). Isolated VEs were occasional (3.6%, 7471), VE Couplets were rare (<1.0%, 61), and VE Triplets were rare (<1.0%, 3). Ventricular Bigeminy and Trigeminy  were present. MD notification criteria for Slow Atrial Fibrillation & Symptomatic Bradycardia met - report posted prior to notification per account request (SE).  SUMMARY: monitor shows slow atrial fibrillation with average HR 49 bpm. At the slowest heart rates with ventricular rates in the 30's, the rhythm appears to be a competing junctional pacemaker. Occasional PVC's noted. No tachyarrhythmias. Recommend EP evaluation.   CT SCANS  CT CORONARY MORPH W/CTA COR W/SCORE 09/23/2019  Addendum 09/23/2019  5:00 PM ADDENDUM REPORT: 09/23/2019 13:35  CLINICAL DATA:  Chest pain  EXAM: Cardiac CTA  MEDICATIONS: Sub lingual nitro. 4mg  x 2  TECHNIQUE: The patient was scanned on a Siemens 192 slice scanner. Gantry rotation speed was 250 msecs. Collimation was 0.6 mm. A 100 kV prospective scan was triggered in the ascending thoracic aorta at 35-75% of the R-R interval. Average HR during the scan was 60 bpm. The 3D data set was interpreted on a dedicated work station using MPR, MIP and VRT modes. A total of 80cc of contrast was used.  FINDINGS: Non-cardiac: See separate report from [facility].  Pulmonary veins drain normally to the left atrium.  Calcium Score: 546 Agatston units.  Coronary Arteries: Right dominant with no anomalies  LM: Mixed plaque ostial left main, minimal stenosis.  LAD system: Calcified plaque proximal LAD, mild (<50%) stenosis. Mixed plaque mid LAD, possible moderate (51-69%) stenosis.  Circumflex system: Mixed plaque proximal LCx, mild (<50%) stenosis. Misregistration artifact noted.  RCA system: Mixed plaque proximal and mid RCA, no more than mild (<50%) stenosis.  IMPRESSION: 1. Coronary artery calcium score 546 Agatston units. This places the patient in the 70th percentile for age and gender, suggesting intermediate risk for future cardiac events.  2.  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  3.  Possible moderate mid LAD stenosis.  Will send for  FFR.  Dalton Mclean   Electronically Signed By: Marca Ancona M.D. On: 09/23/2019 13:35  Narrative EXAM: OVER-READ INTERPRETATION  CT CHEST  The following report is an over-read performed by radiologist Dr. Trudie Reed of [facility] on 09/23/2019. This over-read does not include interpretation of cardiac or coronary anatomy or pathology. The coronary calcium score/coronary CTA interpretation by the cardiologist is attached.  COMPARISON:  None.  FINDINGS: Aortic atherosclerosis. Several small pulmonary nodules are noted throughout the visualize lungs, largest of which is in the posterior aspect of the left lower lobe (axial image 22 of series 12) measuring 1.0 x 0.6 cm (mean diameter of 0.8 cm). Within the visualized portions of the thorax there are no other larger more suspicious appearing pulmonary nodules or masses, there is no acute consolidative airspace disease, no pleural effusions, no pneumothorax and no lymphadenopathy. Visualized portions of the upper abdomen are unremarkable. There are no aggressive appearing lytic or blastic lesions noted in the visualized portions of the skeleton.  IMPRESSION: 1. Multiple small pulmonary nodules in the visualize lungs measuring 8 mm or less in  size. Non-contrast chest CT at 3-6 months is recommended. If the nodules are stable at time of repeat CT, then future CT at 18-24 months (from today's scan) is considered optional for low-risk patients, but is recommended for high-risk patients. This recommendation follows the consensus statement: Guidelines for Management of Incidental Pulmonary Nodules Detected on CT Images: From the Fleischner Society 2017; Radiology 2017; 284:228-243. 2.  Aortic Atherosclerosis (ICD10-I70.0).  Electronically Signed: By: Trudie Reed M.D. On: 09/23/2019 08:31           EKG:  EKG is not ordered today.   Recent Labs: 10/16/2022: BUN 16; Creatinine, Ser 1.02; Potassium  4.4; Sodium 142  Recent Lipid Panel    Component Value Date/Time   CHOL 125 02/20/2021 0824   TRIG 84 02/20/2021 0824   HDL 45 02/20/2021 0824   CHOLHDL 2.8 02/20/2021 0824   CHOLHDL 3.0 01/10/2016 0759   VLDL 19 01/10/2016 0759   LDLCALC 64 02/20/2021 0824   LDLDIRECT 154.8 08/26/2013 0739     Risk Assessment/Calculations:    CHA2DS2-VASc Score = 3   This indicates a 3.2% annual risk of stroke. The patient's score is based upon: CHF History: 0 HTN History: 1 Diabetes History: 0 Stroke History: 0 Vascular Disease History: 0 Age Score: 2 Gender Score: 0               Physical Exam:    VS:  BP 100/60   Pulse (!) 56   Ht 6' (1.829 m)   Wt 189 lb 12.5 oz (86.1 kg)   SpO2 97%   BMI 25.74 kg/m     Wt Readings from Last 3 Encounters:  04/10/23 189 lb 12.5 oz (86.1 kg)  01/21/23 189 lb (85.7 kg)  11/12/22 177 lb 11.1 oz (80.6 kg)     GEN:  Well nourished, well developed in no acute distress HEENT: Normal NECK: No JVD; No carotid bruits LYMPHATICS: No lymphadenopathy CARDIAC: Irregularly irregular, no murmurs, rubs, gallops RESPIRATORY:  Clear to auscultation without rales, wheezing or rhonchi  ABDOMEN: Soft, non-tender, non-distended MUSCULOSKELETAL:  No edema; No deformity  SKIN: Warm and dry NEUROLOGIC:  Alert and oriented x 3 PSYCHIATRIC:  Normal affect   ASSESSMENT:    1. Longstanding persistent atrial fibrillation (HCC)   2. Essential hypertension   3. Coronary artery disease involving native coronary artery of native heart without angina pectoris   4. Abdominal aortic aneurysm (AAA) without rupture, unspecified part (HCC)   5. Mixed hyperlipidemia    PLAN:    In order of problems listed above:  Patient tolerating anticoagulation.  I reviewed his recent echo which shows stable/normal LVEF of 55%.  He does have severe biatrial enlargement but no significant valvular disease.  Will continue current management.  He is counseled about potential  symptoms of symptomatic bradycardia or slow atrial fibrillation and he will keep an eye out for these things.  He is active and doing well at this time. Blood pressure well-controlled.  Continue current management. History of nonobstructive CAD from prior CT coronary angiogram in 2020.  Patient on high intensity statin drug.  No anginal symptoms. The patient has a small infrarenal abdominal aortic aneurysm, even borderline for aneurysm criteria at 3 cm.  He will have a follow-up abdominal aortic duplex in 2025. Treated with rosuvastatin 20 mg daily.  Lipids are excellent with a cholesterol of 125 and an LDL of 64.     Medication Adjustments/Labs and Tests Ordered: Current medicines are reviewed at length with the  patient today.  Concerns regarding medicines are outlined above.  No orders of the defined types were placed in this encounter.  No orders of the defined types were placed in this encounter.   Patient Instructions  Medication Instructions:  .instcur  *If you need a refill on your cardiac medications before your next appointment, please call your pharmacy*   Lab Work: NONE If you have labs (blood work) drawn today and your tests are completely normal, you will receive your results only by: MyChart Message (if you have MyChart) OR A paper copy in the mail If you have any lab test that is abnormal or we need to change your treatment, we will call you to review the results.   Testing/Procedures: NONE   Follow-Up: At [facility], you and your health needs are our priority.  As part of our continuing mission to provide you with exceptional heart care, we have created designated Provider Care Teams.  These Care Teams include your primary Cardiologist (physician) and Advanced Practice Providers (APPs -  Physician Assistants and Nurse Practitioners) who all work together to provide you with the care you need, when you need it.  We recommend signing up for the patient  portal called "MyChart".  Sign up information is provided on this After Visit Summary.  MyChart is used to connect with patients for Virtual Visits (Telemedicine).  Patients are able to view lab/test results, encounter notes, upcoming appointments, etc.  Non-urgent messages can be sent to your provider as well.   To learn more about what you can do with MyChart, go to [email or web address].    Your next appointment:   6 month(s)  Provider:   [name], MD        Signed, [name], MD  04/10/2023 1:10 PM    [facility]

## 2023-04-10 NOTE — Patient Instructions (Signed)
Medication Instructions:  .instcur  *If you need a refill on your cardiac medications before your next appointment, please call your pharmacy*   Lab Work: NONE If you have labs (blood work) drawn today and your tests are completely normal, you will receive your results only by: MyChart Message (if you have MyChart) OR A paper copy in the mail If you have any lab test that is abnormal or we need to change your treatment, we will call you to review the results.   Testing/Procedures: NONE   Follow-Up: At [facility], you and your health needs are our priority.  As part of our continuing mission to provide you with exceptional heart care, we have created designated Provider Care Teams.  These Care Teams include your primary Cardiologist (physician) and Advanced Practice Providers (APPs -  Physician Assistants and Nurse Practitioners) who all work together to provide you with the care you need, when you need it.  We recommend signing up for the patient portal called "MyChart".  Sign up information is provided on this After Visit Summary.  MyChart is used to connect with patients for Virtual Visits (Telemedicine).  Patients are able to view lab/test results, encounter notes, upcoming appointments, etc.  Non-urgent messages can be sent to your provider as well.   To learn more about what you can do with MyChart, go to [email or web address].    Your next appointment:   6 month(s)  Provider:   [name], MD

## 2023-04-17 ENCOUNTER — Other Ambulatory Visit: Payer: Self-pay | Admitting: Cardiovascular Disease

## 2023-06-05 ENCOUNTER — Encounter: Payer: Self-pay | Admitting: Cardiovascular Disease

## 2023-06-05 MED ORDER — HYDROCHLOROTHIAZIDE 12.5 MG PO CAPS: 12.5000 mg | ORAL_CAPSULE | Freq: Every day | ORAL | 0 refills | Status: DC

## 2023-06-05 MED ORDER — APIXABAN 5 MG PO TABS: 5.0000 mg | ORAL_TABLET | Freq: Two times a day (BID) | ORAL | 0 refills | Status: DC

## 2023-06-05 MED ORDER — ROSUVASTATIN CALCIUM 20 MG PO TABS: 20.0000 mg | ORAL_TABLET | Freq: Every day | ORAL | 0 refills | Status: DC

## 2023-06-05 MED ORDER — LOSARTAN POTASSIUM 100 MG PO TABS: 100.0000 mg | ORAL_TABLET | Freq: Every day | ORAL | 0 refills | Status: DC

## 2023-06-12 ENCOUNTER — Other Ambulatory Visit: Payer: Self-pay | Admitting: Cardiovascular Disease

## 2023-07-11 ENCOUNTER — Other Ambulatory Visit: Payer: Self-pay

## 2023-07-11 DIAGNOSIS — I4811 Longstanding persistent atrial fibrillation: Secondary | ICD-10-CM

## 2023-07-11 MED ORDER — APIXABAN 5 MG PO TABS: 5.0000 mg | ORAL_TABLET | Freq: Two times a day (BID) | ORAL | 1 refills | Status: DC

## 2023-07-11 NOTE — Telephone Encounter (Signed)
Prescription refill request for Eliquis received. Indication: Afib  Last office visit: 04/10/23 [facility] Seltzer)  Scr: 1.02 (10/16/22)  Age: 77 Weight: 86.1kg  Appropriate dose. Refill sent.

## 2023-08-23 ENCOUNTER — Encounter: Payer: Self-pay | Admitting: Cardiovascular Disease

## 2023-09-11 ENCOUNTER — Encounter: Payer: Self-pay | Admitting: Cardiovascular Disease

## 2023-09-18 ENCOUNTER — Ambulatory Visit: Payer: Medicare Other | Admitting: Family

## 2023-09-18 ENCOUNTER — Other Ambulatory Visit: Payer: Self-pay

## 2023-09-18 DIAGNOSIS — M5416 Radiculopathy, lumbar region: Secondary | ICD-10-CM | POA: Diagnosis not present

## 2023-09-18 DIAGNOSIS — M25512 Pain in left shoulder: Secondary | ICD-10-CM

## 2023-09-18 DIAGNOSIS — M12811 Other specific arthropathies, not elsewhere classified, right shoulder: Secondary | ICD-10-CM | POA: Diagnosis not present

## 2023-09-18 MED ORDER — PREDNISONE 50 MG PO TABS: ORAL_TABLET | ORAL | 0 refills | Status: AC

## 2023-09-18 NOTE — Progress Notes (Signed)
Office Visit Note   Patient: Leroy Lara           Date of Birth: November 06, 1946           MRN: [id number] Visit Date: 09/18/2023              Requested by: Richmond Campbell., PA-C [address]  [place] [address] PCP: Richmond Campbell., PA-C  Chief Complaint  Patient presents with   Left Shoulder - Pain      HPI: The patient is a 77 year old gentleman who presents today concern for right shoulder pain which is new he does have a long history of rotator cuff issues on the left however he now is having new pain in his right shoulder.  He is right-hand dominant.  Has not had an acute injury.  Did have fairly acute onset of his right shoulder pain this has been ongoing for weeks without any improvement he has been using anti-inflammatories without relief.  Feels similar to previous issues he has had on the left  He also is reporting pain to the bilateral posterior buttocks and thighs feels the same as previous episodes of lumbar radiculopathy however it is new that it is affecting bilateral lower extremities he denies any weakness no red flag symptoms has previously had injections with Dr. Alvester Morin  Assessment & Plan: Visit Diagnoses:  1. Rotator cuff arthropathy, right   2. Acute pain of left shoulder   3. Lumbar radiculopathy     Plan: Today would like to forego Depo-Medrol injection of the right shoulder will place him on a course of oral prednisone for his lumbar radiculopathy discussed conservative management of the shoulder he will follow-up with me should he prefer to proceed with Depo-Medrol injection of the right shoulder.  He will follow-up with Dr. Alvester Morin if he does not improve with the prednisone  Follow-Up Instructions: No follow-ups on file.   Back Exam   Tenderness  The patient is experiencing no tenderness.   Muscle Strength  The patient has normal back strength.  Tests  Straight leg raise right: positive Straight leg raise left:  positive   Right Shoulder Exam   Tenderness  The patient is experiencing tenderness in the biceps tendon.  Range of Motion  Forward flexion:  abnormal   Muscle Strength  The patient has normal right shoulder strength.  Tests  Impingement: positive Drop arm: negative   Left Shoulder Exam   Other  Erythema: absent Sensation: normal   Comments:  Popeye deformity, new      Patient is alert, oriented, no adenopathy, well-dressed, normal affect, normal respiratory effort.   Imaging: No results found. No images are attached to the encounter.  Labs: No results found for: "HGBA1C", "ESRSEDRATE", "CRP", "LABURIC", "REPTSTATUS", "GRAMSTAIN", "CULT", "LABORGA"   Lab Results  Component Value Date   ALBUMIN 3.5 (L) 02/20/2021   ALBUMIN 4.0 06/27/2020   ALBUMIN 3.8 09/01/2019    No results found for: "MG" No results found for: "VD25OH"  No results found for: "PREALBUMIN"    Latest Ref Rng & Units 03/30/2022    7:29 AM 02/20/2021    8:24 AM 06/27/2020    4:00 PM  CBC EXTENDED  WBC 3.4 - 10.8 x10E3/uL 7.0  7.9  10.2   RBC 4.14 - 5.80 x10E6/uL 4.02  4.39  4.39   Hemoglobin 13.0 - 17.7 g/dL 62.1  30.8  65.7   HCT 37.5 - 51.0 % 39.5  42.1  42.5  Platelets 150 - 450 x10E3/uL 198  220  249   NEUT# 1.4 - 7.0 x10E3/uL  5.7    Lymph# 0.7 - 3.1 x10E3/uL  1.1       There is no height or weight on file to calculate BMI.  Orders:  Orders Placed This Encounter  Procedures   XR Shoulder Left   Meds ordered this encounter  Medications   predniSONE (DELTASONE) 50 MG tablet    Sig: Take one tablet by mouth once daily for 5 days.    Dispense:  5 tablet    Refill:  0     Procedures: No procedures performed  Clinical Data: No additional findings.  ROS:  All other systems negative, except as noted in the HPI. Review of Systems  Constitutional: Negative.   Musculoskeletal:  Positive for arthralgias, back pain and myalgias.  Neurological:  Positive for weakness.  Negative for numbness.    Objective: Vital Signs: There were no vitals taken for this visit.  Specialty Comments:  No specialty comments available.  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   Right hip pain 01/29/2017   Left hip pain 01/29/2017   Abnormal EKG 01/28/2012   Hypertension, benign 01/28/2012   Right carotid bruit 01/28/2012   Past Medical History:  Diagnosis Date   AAA (abdominal aortic aneurysm) (HCC)    Atrial fibrillation, permanent (HCC)    Carotid artery calcification, bilateral    Essential hypertension    Gastroesophageal reflux disease    Vocal cord mass     Family History  Problem Relation Age of Onset   Coronary artery disease Mother 40       myocardial infarction   Cancer - Lung Father    Heart failure Sister    Pulmonary fibrosis Brother    Lymphoma Brother 37    Past Surgical History:  Procedure Laterality Date   MICROLARYNGOSCOPY WITH CO2 LASER AND EXCISION OF VOCAL CORD LESION N/A 11/12/2022   Procedure: MICROLARYNGOSCOPY WITH EXCISION OF VOCAL CORD LESION;  Surgeon: [name], MD;  Location: [facility];  Service: ENT;  Laterality: N/A;   Social History   Occupational History   Not on file  Tobacco Use   Smoking status: Former   Smokeless tobacco: Never  Vaping Use   Vaping status: Never Used  Substance and Sexual Activity   Alcohol use: Never   Drug use: Never   Sexual activity: Not on file

## 2023-09-20 ENCOUNTER — Encounter: Payer: Self-pay | Admitting: Family

## 2023-09-23 ENCOUNTER — Other Ambulatory Visit: Payer: Self-pay | Admitting: Family Medicine

## 2023-09-23 DIAGNOSIS — J439 Emphysema, unspecified: Secondary | ICD-10-CM

## 2023-09-23 DIAGNOSIS — F17210 Nicotine dependence, cigarettes, uncomplicated: Secondary | ICD-10-CM

## 2023-10-18 ENCOUNTER — Encounter: Payer: Self-pay | Admitting: Cardiovascular Disease

## 2023-10-18 ENCOUNTER — Ambulatory Visit: Payer: Medicare Other | Attending: Cardiovascular Disease | Admitting: Cardiovascular Disease

## 2023-10-18 VITALS — BP 120/62 | HR 49 | Ht 72.0 in | Wt 188.4 lb

## 2023-10-18 DIAGNOSIS — I4819 Other persistent atrial fibrillation: Secondary | ICD-10-CM

## 2023-10-18 DIAGNOSIS — E782 Mixed hyperlipidemia: Secondary | ICD-10-CM | POA: Diagnosis present

## 2023-10-18 DIAGNOSIS — I6523 Occlusion and stenosis of bilateral carotid arteries: Secondary | ICD-10-CM

## 2023-10-18 DIAGNOSIS — I10 Essential (primary) hypertension: Secondary | ICD-10-CM

## 2023-10-18 NOTE — Patient Instructions (Signed)
Follow-Up: At [facility], you and your health needs are our priority.  As part of our continuing mission to provide you with exceptional heart care, we have created designated Provider Care Teams.  These Care Teams include your primary Cardiologist (physician) and Advanced Practice Providers (APPs -  Physician Assistants and Nurse Practitioners) who all work together to provide you with the care you need, when you need it.  Your next appointment:   6 month(s)  Provider:   [name], MD

## 2023-10-18 NOTE — Progress Notes (Signed)
Cardiology Office Note:    Date:  10/18/2023   ID:  Leroy Lara, DOB Apr 25, 1946, MRN [id number]  PCP:  Richmond Campbell PA-C   [facility] Providers Cardiologist:  [name], MD     Referring MD: Richmond Campbell., PA-C   Chief Complaint  Patient presents with   Atrial Fibrillation    History of Present Illness:    Leroy Lara is a 77 y.o. male with a hx of permanent atrial fibrillation with slow ventricular rate, hypertension, abdominal aortic aneurysm, and mild anemia nonobstructive carotid stenosis, presenting for follow-up evaluation today.   He is here alone today.  He has been doing well from a cardiac perspective.  He denies shortness of breath, heart palpitations, or chest pain.  He remains physically active.  He is pretty limited by symptoms of arthritis which have been longstanding for him.  He reports no acute change in this.  He is tolerating apixaban without bleeding problems.  Current Medications: Current Meds  Medication Sig   apixaban (ELIQUIS) 5 MG TABS tablet Take 1 tablet (5 mg total) by mouth 2 (two) times daily.   budesonide (PULMICORT) 0.5 MG/2ML nebulizer solution Take 0.5 mg by nebulization 2 (two) times daily.   clobetasol ointment (TEMOVATE) 0.05 %    ELIQUIS 5 MG TABS tablet TAKE 1 TABLET TWICE A DAY   hydrochlorothiazide (MICROZIDE) 12.5 MG capsule TAKE 1 CAPSULE BY MOUTH EVERY DAY   Lifitegrast (XIIDRA) 5 % SOLN Apply to eye as needed.   losartan (COZAAR) 100 MG tablet TAKE 1 TABLET DAILY   Multiple Vitamin (MULTI-VITAMIN) tablet Take by mouth daily.   omeprazole (PRILOSEC) 20 MG capsule Take 20 mg by mouth daily.   OXISTAT 1 % lotion as needed.   predniSONE (DELTASONE) 50 MG tablet Take one tablet by mouth once daily for 5 days.   rosuvastatin (CRESTOR) 20 MG tablet Take 1 tablet (20 mg total) by mouth daily.   tamsulosin (FLOMAX) 0.4 MG CAPS capsule Take 0.4 mg by mouth daily.     Allergies:   Sulfamethoxazole,  Sulfasalazine, and Sulfonamide derivatives   ROS:   Please see the history of present illness.    All other systems reviewed and are negative.  EKGs/Labs/Other Studies Reviewed:    The following studies were reviewed today: Cardiac Studies & Procedures     STRESS TESTS  EXERCISE TOLERANCE TEST (ETT) 06/19/2018  Narrative  Blood pressure demonstrated a hypertensive response to exercise.  Baseline EKG showed atrial fibrillation with LBBB.  Nondiagnostic EKG for ischemia due to underlying LBBB.  Normal chronotropic response to exercise with adequate heart rate response reaching 90% of MPHR.  Mild to moderately impaired exercise capacity at 5.6 mets.   ECHOCARDIOGRAM  ECHOCARDIOGRAM COMPLETE 03/26/2023  Narrative ECHOCARDIOGRAM REPORT    Patient Name:   Leroy Lara [facility] Date of Exam: 03/26/2023 Medical Rec #:  [id number]      Height:       72.0 in Accession #:    [id number]     Weight:       189.0 lb Date of Birth:  Jul 04, 1946      BSA:          2.080 m Patient Age:    76 years       BP:           125/65 mmHg Patient Gender: M              HR:  55 bpm. Exam Location:  [address]  Procedure: 2D Echo, Cardiac Doppler and Color Doppler  Indications:    I48.19 Atrial Fibrillations  History:        Patient has prior history of Echocardiogram examinations, most recent 04/05/2022. CAD, AAA; Risk Factors:Hypertension and HLD.  Sonographer:    Clearence Ped RCS Referring Phys: [phone] Terrall Bley  IMPRESSIONS   1. Left ventricular ejection fraction, by estimation, is 55%. The left ventricle has normal function. The left ventricle has no regional wall motion abnormalities. There is mild left ventricular hypertrophy. Left ventricular diastolic parameters are indeterminate. 2. Right ventricular systolic function is normal. The right ventricular size is normal. There is normal pulmonary artery systolic pressure. The estimated right ventricular systolic pressure is 25.5  mmHg. 3. Left atrial size was severely dilated. 4. Right atrial size was moderately dilated. 5. The mitral valve is grossly normal. Mild mitral valve regurgitation. No evidence of mitral stenosis. 6. The aortic valve is tricuspid. Aortic valve regurgitation is trivial. No aortic stenosis is present. 7. The inferior vena cava is normal in size with greater than 50% respiratory variability, suggesting right atrial pressure of 3 mmHg.  FINDINGS Left Ventricle: Left ventricular ejection fraction, by estimation, is 55%. The left ventricle has normal function. The left ventricle has no regional wall motion abnormalities. The left ventricular internal cavity size was normal in size. There is mild left ventricular hypertrophy. Left ventricular diastolic parameters are indeterminate.  Right Ventricle: The right ventricular size is normal. No increase in right ventricular wall thickness. Right ventricular systolic function is normal. There is normal pulmonary artery systolic pressure. The tricuspid regurgitant velocity is 2.37 m/s, and with an assumed right atrial pressure of 3 mmHg, the estimated right ventricular systolic pressure is 25.5 mmHg.  Left Atrium: Left atrial size was severely dilated.  Right Atrium: Right atrial size was moderately dilated.  Pericardium: There is no evidence of pericardial effusion. Presence of epicardial fat layer.  Mitral Valve: The mitral valve is grossly normal. Mild mitral valve regurgitation. No evidence of mitral valve stenosis.  Tricuspid Valve: The tricuspid valve is normal in structure. Tricuspid valve regurgitation is mild . No evidence of tricuspid stenosis.  Aortic Valve: The aortic valve is tricuspid. Aortic valve regurgitation is trivial. No aortic stenosis is present.  Pulmonic Valve: The pulmonic valve was normal in structure. Pulmonic valve regurgitation is trivial. No evidence of pulmonic stenosis.  Aorta: The aortic root is normal in size and  structure.  Venous: The inferior vena cava is normal in size with greater than 50% respiratory variability, suggesting right atrial pressure of 3 mmHg.  IAS/Shunts: The atrial septum is grossly normal.   LEFT VENTRICLE PLAX 2D LVIDd:         5.60 cm   Diastology LVIDs:         3.80 cm   LV e' medial:    7.29 cm/s LV PW:         1.10 cm   LV E/e' medial:  16.0 LV IVS:        1.10 cm   LV e' lateral:   15.70 cm/s LVOT diam:     2.20 cm   LV E/e' lateral: 7.4 LV SV:         95 LV SV Index:   46 LVOT Area:     3.80 cm   RIGHT VENTRICLE RV Basal diam:  3.70 cm RV S prime:     9.57 cm/s TAPSE (M-mode): 2.5 cm RVSP:  25.5 mmHg  LEFT ATRIUM              Index        RIGHT ATRIUM           Index LA diam:        4.90 cm  2.36 cm/m   RA Pressure: 3.00 mmHg LA Vol (A2C):   75.4 ml  36.25 ml/m  RA Area:     20.90 cm LA Vol (A4C):   95.2 ml  45.77 ml/m  RA Volume:   62.50 ml  30.05 ml/m LA Biplane Vol: 101.0 ml 48.56 ml/m AORTIC VALVE LVOT Vmax:   112.33 cm/s LVOT Vmean:  74.067 cm/s LVOT VTI:    0.251 m  AORTA Ao Root diam: 3.70 cm Ao Asc diam:  3.50 cm  MITRAL VALVE                TRICUSPID VALVE MV Area (PHT):              TR Peak grad:   22.5 mmHg MV Decel Time:              TR Vmax:        237.00 cm/s MV E velocity: 116.50 cm/s  Estimated RAP:  3.00 mmHg RVSP:           25.5 mmHg  SHUNTS Systemic VTI:  0.25 m Systemic Diam: 2.20 cm  Weston Brass MD Electronically signed by Weston Brass MD Signature Date/Time: 03/26/2023/11:41:16 AM    Final    MONITORS  LONG TERM MONITOR (3-14 DAYS) 11/12/2022  Narrative Patch Wear Time:  2 days and 22 hours (2023-11-16T09:24:14-0500 to 2023-11-19T07:48:30-0500)  Atrial Fibrillation occurred continuously (100% burden), ranging from 32-95 bpm (avg of 49 bpm). Isolated VEs were occasional (3.6%, 7471), VE Couplets were rare (<1.0%, 61), and VE Triplets were rare (<1.0%, 3). Ventricular Bigeminy and Trigeminy  were present. MD notification criteria for Slow Atrial Fibrillation & Symptomatic Bradycardia met - report posted prior to notification per account request (SE).  SUMMARY: monitor shows slow atrial fibrillation with average HR 49 bpm. At the slowest heart rates with ventricular rates in the 30's, the rhythm appears to be a competing junctional pacemaker. Occasional PVC's noted. No tachyarrhythmias. Recommend EP evaluation.   CT SCANS  CT CORONARY MORPH W/CTA COR W/SCORE 09/23/2019  Addendum 09/23/2019  5:00 PM ADDENDUM REPORT: 09/23/2019 13:35  CLINICAL DATA:  Chest pain  EXAM: Cardiac CTA  MEDICATIONS: Sub lingual nitro. 4mg  x 2  TECHNIQUE: The patient was scanned on a Siemens 192 slice scanner. Gantry rotation speed was 250 msecs. Collimation was 0.6 mm. A 100 kV prospective scan was triggered in the ascending thoracic aorta at 35-75% of the R-R interval. Average HR during the scan was 60 bpm. The 3D data set was interpreted on a dedicated work station using MPR, MIP and VRT modes. A total of 80cc of contrast was used.  FINDINGS: Non-cardiac: See separate report from [facility].  Pulmonary veins drain normally to the left atrium.  Calcium Score: 546 Agatston units.  Coronary Arteries: Right dominant with no anomalies  LM: Mixed plaque ostial left main, minimal stenosis.  LAD system: Calcified plaque proximal LAD, mild (<50%) stenosis. Mixed plaque mid LAD, possible moderate (51-69%) stenosis.  Circumflex system: Mixed plaque proximal LCx, mild (<50%) stenosis. Misregistration artifact noted.  RCA system: Mixed plaque proximal and mid RCA, no more than mild (<50%) stenosis.  IMPRESSION: 1. Coronary artery calcium score 546 Agatston units. This places  the patient in the 70th percentile for age and gender, suggesting intermediate risk for future cardiac events.  2.  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  3.  Possible moderate mid LAD stenosis.  Will send for  FFR.  Dalton Mclean   Electronically Signed By: Marca Ancona M.D. On: 09/23/2019 13:35  Narrative EXAM: OVER-READ INTERPRETATION  CT CHEST  The following report is an over-read performed by radiologist Dr. Trudie Reed of [facility] on 09/23/2019. This over-read does not include interpretation of cardiac or coronary anatomy or pathology. The coronary calcium score/coronary CTA interpretation by the cardiologist is attached.  COMPARISON:  None.  FINDINGS: Aortic atherosclerosis. Several small pulmonary nodules are noted throughout the visualize lungs, largest of which is in the posterior aspect of the left lower lobe (axial image 22 of series 12) measuring 1.0 x 0.6 cm (mean diameter of 0.8 cm). Within the visualized portions of the thorax there are no other larger more suspicious appearing pulmonary nodules or masses, there is no acute consolidative airspace disease, no pleural effusions, no pneumothorax and no lymphadenopathy. Visualized portions of the upper abdomen are unremarkable. There are no aggressive appearing lytic or blastic lesions noted in the visualized portions of the skeleton.  IMPRESSION: 1. Multiple small pulmonary nodules in the visualize lungs measuring 8 mm or less in size. Non-contrast chest CT at 3-6 months is recommended. If the nodules are stable at time of repeat CT, then future CT at 18-24 months (from today's scan) is considered optional for low-risk patients, but is recommended for high-risk patients. This recommendation follows the consensus statement: Guidelines for Management of Incidental Pulmonary Nodules Detected on CT Images: From the Fleischner Society 2017; Radiology 2017; 284:228-243. 2.  Aortic Atherosclerosis (ICD10-I70.0).  Electronically Signed: By: Trudie Reed M.D. On: 09/23/2019 08:31          EKG:        Recent Labs: No results found for requested labs within last 365 days.  Recent Lipid  Panel    Component Value Date/Time   CHOL 125 02/20/2021 0824   TRIG 84 02/20/2021 0824   HDL 45 02/20/2021 0824   CHOLHDL 2.8 02/20/2021 0824   CHOLHDL 3.0 01/10/2016 0759   VLDL 19 01/10/2016 0759   LDLCALC 64 02/20/2021 0824   LDLDIRECT 154.8 08/26/2013 0739     Risk Assessment/Calculations:    CHA2DS2-VASc Score = 3   This indicates a 3.2% annual risk of stroke. The patient's score is based upon: CHF History: 0 HTN History: 1 Diabetes History: 0 Stroke History: 0 Vascular Disease History: 0 Age Score: 2 Gender Score: 0               Physical Exam:    VS:  BP 120/62   Pulse (!) 49   Ht 6' (1.829 m)   Wt 188 lb 6.4 oz (85.5 kg)   SpO2 92%   BMI 25.55 kg/m     Wt Readings from Last 3 Encounters:  10/18/23 188 lb 6.4 oz (85.5 kg)  04/10/23 189 lb 12.5 oz (86.1 kg)  01/21/23 189 lb (85.7 kg)     GEN:  Well nourished, well developed in no acute distress HEENT: Normal NECK: No JVD; No carotid bruits LYMPHATICS: No lymphadenopathy CARDIAC: Irregularly irregular, no murmurs, rubs, gallops RESPIRATORY:  Clear to auscultation without rales, wheezing or rhonchi  ABDOMEN: Soft, non-tender, non-distended MUSCULOSKELETAL:  No edema; No deformity  SKIN: Warm and dry NEUROLOGIC:  Alert and oriented x 3 PSYCHIATRIC:  Normal affect   Assessment & Plan Persistent atrial fibrillation (HCC) The patient is followed for longstanding persistent atrial fibrillation with slow ventricular response.  He has been evaluated by EP and has no firm indication for permanent pacemaking.  He has been clinically stable and we obviously are avoiding any AV nodal blocking agents.  He is tolerating apixaban for anticoagulation.  His echocardiogram from April 2024 is reviewed with an LVEF of 55%, severe left atrial enlargement, and no significant valvular disease.  He will return for follow-up in 6 months. Mixed hyperlipidemia Treated with rosuvastatin.  Goal LDL cholesterol is less than  70 in the context of his vascular disease.  Lipids are reviewed with a total cholesterol 126, triglycerides 84, HDL 55, LDL 55.  Continue current management. Essential hypertension Well-controlled on hydrochlorothiazide and losartan.  No changes are made in his medical regimen today. Bilateral carotid artery stenosis Less than 40% bilateral ICA stenosis on his last study.  Continue clinical follow-up.  Continue medical management.            Medication Adjustments/Labs and Tests Ordered: Current medicines are reviewed at length with the patient today.  Concerns regarding medicines are outlined above.  No orders of the defined types were placed in this encounter.  No orders of the defined types were placed in this encounter.   There are no Patient Instructions on file for this visit.   Signed, [name], MD  10/18/2023 10:27 AM    [facility]

## 2023-10-21 ENCOUNTER — Ambulatory Visit: Payer: Medicare Other | Admitting: Cardiovascular Disease

## 2023-10-28 ENCOUNTER — Other Ambulatory Visit: Payer: Self-pay | Admitting: Cardiovascular Disease

## 2023-11-12 ENCOUNTER — Other Ambulatory Visit: Payer: Self-pay | Admitting: Family Medicine

## 2023-11-12 DIAGNOSIS — R911 Solitary pulmonary nodule: Secondary | ICD-10-CM

## 2023-11-21 ENCOUNTER — Ambulatory Visit
Admission: RE | Admit: 2023-11-21 | Discharge: 2023-11-21 | Disposition: A | Discharge status: Home / Self Care | Payer: Medicare Other | Source: Ambulatory Visit | Attending: Family Medicine | Admitting: Family Medicine

## 2023-11-21 DIAGNOSIS — R911 Solitary pulmonary nodule: Secondary | ICD-10-CM

## 2023-12-10 ENCOUNTER — Other Ambulatory Visit: Payer: Self-pay | Admitting: Cardiovascular Disease

## 2023-12-10 DIAGNOSIS — I4811 Longstanding persistent atrial fibrillation: Secondary | ICD-10-CM

## 2023-12-10 NOTE — Telephone Encounter (Signed)
 Eliquis 5mg  refill request received. Patient is 77 years old, weight-85.5kg, Crea-1.12 on 09/30/23 via Care Everywhere from [facility], Diagnosis-Afib, and last seen by Dr. Excell Seltzer on 10/18/23. Dose is appropriate based on dosing criteria.

## 2023-12-16 ENCOUNTER — Ambulatory Visit: Payer: Medicare Other | Admitting: Cardiovascular Disease

## 2024-04-13 ENCOUNTER — Ambulatory Visit: Payer: Medicare Other | Attending: Cardiovascular Disease | Admitting: Cardiovascular Disease

## 2024-04-13 ENCOUNTER — Encounter: Payer: Self-pay | Admitting: Cardiovascular Disease

## 2024-04-13 ENCOUNTER — Other Ambulatory Visit: Payer: Self-pay | Admitting: Cardiovascular Disease

## 2024-04-13 VITALS — BP 124/70 | HR 43 | Ht 72.0 in | Wt 183.8 lb

## 2024-04-13 DIAGNOSIS — I4819 Other persistent atrial fibrillation: Secondary | ICD-10-CM

## 2024-04-13 DIAGNOSIS — I4891 Unspecified atrial fibrillation: Secondary | ICD-10-CM

## 2024-04-13 DIAGNOSIS — I10 Essential (primary) hypertension: Secondary | ICD-10-CM

## 2024-04-13 DIAGNOSIS — E782 Mixed hyperlipidemia: Secondary | ICD-10-CM | POA: Diagnosis present

## 2024-04-13 DIAGNOSIS — I6523 Occlusion and stenosis of bilateral carotid arteries: Secondary | ICD-10-CM

## 2024-04-13 MED ORDER — APIXABAN 5 MG PO TABS
5.0000 mg | ORAL_TABLET | Freq: Two times a day (BID) | ORAL | 3 refills | Status: AC
Start: 2024-04-13 — End: ?

## 2024-04-13 NOTE — Patient Instructions (Signed)
 Follow-Up: At [facility], you and your health needs are our priority.  As part of our continuing mission to provide you with exceptional heart care, our providers are all part of one team.  This team includes your primary Cardiologist (physician) and Advanced Practice Providers or APPs (Physician Assistants and Nurse Practitioners) who all work together to provide you with the care you need, when you need it.  Your next appointment:   6 month(s)  Provider:   Arnoldo Lapping, MD

## 2024-04-13 NOTE — Progress Notes (Signed)
 Cardiology Office Note:    Date:  04/13/2024   ID:  Leroy Lara, DOB 10/17/1946, MRN [id number]  PCP:  Lory Rough PA-C   [facility] Providers Cardiologist:  Arnoldo Lapping, MD     Referring MD: Lory Rough., PA-C   Chief Complaint  Patient presents with   Atrial Fibrillation    History of Present Illness:    Leroy Lara is a 78 y.o. male with a hx of permanent atrial fibrillation with slow ventricular rate, hypertension, abdominal aortic aneurysm, and mild nonobstructive carotid stenosis, presenting for follow-up evaluation today.   He is here alone today.  Overall he is doing pretty well.  He denies chest pain or chest pressure, shortness of breath, or leg swelling.  He has some dizziness especially when he turns his head quickly or has to look up in the air.  Sometimes he is dizzy when he is bent over and then stands up.  He has not had syncope or presyncope.  He denies marked fatigue or weakness.   Current Medications: Current Meds  Medication Sig   apixaban  (ELIQUIS ) 5 MG TABS tablet TAKE 1 TABLET TWICE A DAY   budesonide (PULMICORT) 0.5 MG/2ML nebulizer solution Take 0.5 mg by nebulization 2 (two) times daily.   clobetasol ointment (TEMOVATE) 0.05 %    hydrochlorothiazide  (HYDRODIURIL ) 12.5 MG tablet TAKE 1 TABLET DAILY   Lifitegrast (XIIDRA) 5 % SOLN Apply to eye as needed.   losartan  (COZAAR ) 100 MG tablet TAKE 1 TABLET DAILY   Multiple Vitamin (MULTI-VITAMIN) tablet Take by mouth daily.   omeprazole (PRILOSEC) 20 MG capsule Take 20 mg by mouth daily.   OXISTAT 1 % lotion as needed.   predniSONE  (DELTASONE ) 50 MG tablet Take one tablet by mouth once daily for 5 days.   rosuvastatin  (CRESTOR ) 20 MG tablet TAKE 1 TABLET DAILY   [DISCONTINUED] ELIQUIS  5 MG TABS tablet TAKE 1 TABLET TWICE A DAY   [DISCONTINUED] hydrochlorothiazide  (MICROZIDE ) 12.5 MG capsule TAKE 1 CAPSULE BY MOUTH EVERY DAY   [DISCONTINUED] losartan  (COZAAR ) 100 MG tablet  Take 1 tablet (100 mg total) by mouth daily.   [DISCONTINUED] rosuvastatin  (CRESTOR ) 20 MG tablet Take 1 tablet (20 mg total) by mouth daily.   [DISCONTINUED] tamsulosin (FLOMAX) 0.4 MG CAPS capsule Take 0.4 mg by mouth daily.     Allergies:   Sulfamethoxazole, Sulfasalazine, and Sulfonamide derivatives   ROS:   Please see the history of present illness.    All other systems reviewed and are negative.  EKGs/Labs/Other Studies Reviewed:    The following studies were reviewed today: Cardiac Studies & Procedures   ______________________________________________________________________________________________   STRESS TESTS  EXERCISE TOLERANCE TEST (ETT) 06/19/2018  Narrative  Blood pressure demonstrated a hypertensive response to exercise.  Baseline EKG showed atrial fibrillation with LBBB.  Nondiagnostic EKG for ischemia due to underlying LBBB.  Normal chronotropic response to exercise with adequate heart rate response reaching 90% of MPHR.  Mild to moderately impaired exercise capacity at 5.6 mets.   ECHOCARDIOGRAM  ECHOCARDIOGRAM COMPLETE 03/26/2023  Narrative ECHOCARDIOGRAM REPORT    Patient Name:   Leroy Lara [facility] Date of Exam: 03/26/2023 Medical Rec #:  [id number]      Height:       72.0 in Accession #:    [id number]     Weight:       189.0 lb Date of Birth:  09-27-1946      BSA:  2.080 m Patient Age:    76 years       BP:           125/65 mmHg Patient Gender: M              HR:           55 bpm. Exam Location:  [address]  Procedure: 2D Echo, Cardiac Doppler and Color Doppler  Indications:    I48.19 Atrial Fibrillations  History:        Patient has prior history of Echocardiogram examinations, most recent 04/05/2022. CAD, AAA; Risk Factors:Hypertension and HLD.  Sonographer:    Juventino Oppenheim RCS Referring Phys: [phone] Leroy Lara  IMPRESSIONS   1. Left ventricular ejection fraction, by estimation, is 55%. The left ventricle has normal function.  The left ventricle has no regional wall motion abnormalities. There is mild left ventricular hypertrophy. Left ventricular diastolic parameters are indeterminate. 2. Right ventricular systolic function is normal. The right ventricular size is normal. There is normal pulmonary artery systolic pressure. The estimated right ventricular systolic pressure is 25.5 mmHg. 3. Left atrial size was severely dilated. 4. Right atrial size was moderately dilated. 5. The mitral valve is grossly normal. Mild mitral valve regurgitation. No evidence of mitral stenosis. 6. The aortic valve is tricuspid. Aortic valve regurgitation is trivial. No aortic stenosis is present. 7. The inferior vena cava is normal in size with greater than 50% respiratory variability, suggesting right atrial pressure of 3 mmHg.  FINDINGS Left Ventricle: Left ventricular ejection fraction, by estimation, is 55%. The left ventricle has normal function. The left ventricle has no regional wall motion abnormalities. The left ventricular internal cavity size was normal in size. There is mild left ventricular hypertrophy. Left ventricular diastolic parameters are indeterminate.  Right Ventricle: The right ventricular size is normal. No increase in right ventricular wall thickness. Right ventricular systolic function is normal. There is normal pulmonary artery systolic pressure. The tricuspid regurgitant velocity is 2.37 m/s, and with an assumed right atrial pressure of 3 mmHg, the estimated right ventricular systolic pressure is 25.5 mmHg.  Left Atrium: Left atrial size was severely dilated.  Right Atrium: Right atrial size was moderately dilated.  Pericardium: There is no evidence of pericardial effusion. Presence of epicardial fat layer.  Mitral Valve: The mitral valve is grossly normal. Mild mitral valve regurgitation. No evidence of mitral valve stenosis.  Tricuspid Valve: The tricuspid valve is normal in structure. Tricuspid valve  regurgitation is mild . No evidence of tricuspid stenosis.  Aortic Valve: The aortic valve is tricuspid. Aortic valve regurgitation is trivial. No aortic stenosis is present.  Pulmonic Valve: The pulmonic valve was normal in structure. Pulmonic valve regurgitation is trivial. No evidence of pulmonic stenosis.  Aorta: The aortic root is normal in size and structure.  Venous: The inferior vena cava is normal in size with greater than 50% respiratory variability, suggesting right atrial pressure of 3 mmHg.  IAS/Shunts: The atrial septum is grossly normal.   LEFT VENTRICLE PLAX 2D LVIDd:         5.60 cm   Diastology LVIDs:         3.80 cm   LV e' medial:    7.29 cm/s LV PW:         1.10 cm   LV E/e' medial:  16.0 LV IVS:        1.10 cm   LV e' lateral:   15.70 cm/s LVOT diam:     2.20 cm  LV E/e' lateral: 7.4 LV SV:         95 LV SV Index:   46 LVOT Area:     3.80 cm   RIGHT VENTRICLE RV Basal diam:  3.70 cm RV S prime:     9.57 cm/s TAPSE (M-mode): 2.5 cm RVSP:           25.5 mmHg  LEFT ATRIUM              Index        RIGHT ATRIUM           Index LA diam:        4.90 cm  2.36 cm/m   RA Pressure: 3.00 mmHg LA Vol (A2C):   75.4 ml  36.25 ml/m  RA Area:     20.90 cm LA Vol (A4C):   95.2 ml  45.77 ml/m  RA Volume:   62.50 ml  30.05 ml/m LA Biplane Vol: 101.0 ml 48.56 ml/m AORTIC VALVE LVOT Vmax:   112.33 cm/s LVOT Vmean:  74.067 cm/s LVOT VTI:    0.251 m  AORTA Ao Root diam: 3.70 cm Ao Asc diam:  3.50 cm  MITRAL VALVE                TRICUSPID VALVE MV Area (PHT):              TR Peak grad:   22.5 mmHg MV Decel Time:              TR Vmax:        237.00 cm/s MV E velocity: 116.50 cm/s  Estimated RAP:  3.00 mmHg RVSP:           25.5 mmHg  SHUNTS Systemic VTI:  0.25 m Systemic Diam: 2.20 cm  Grady Lawman MD Electronically signed by Grady Lawman MD Signature Date/Time: 03/26/2023/11:41:16 AM    Final    MONITORS  LONG TERM MONITOR (3-14 DAYS)  11/12/2022  Narrative Patch Wear Time:  2 days and 22 hours (2023-11-16T09:24:14-0500 to 2023-11-19T07:48:30-0500)  Atrial Fibrillation occurred continuously (100% burden), ranging from 32-95 bpm (avg of 49 bpm). Isolated VEs were occasional (3.6%, 7471), VE Couplets were rare (<1.0%, 61), and VE Triplets were rare (<1.0%, 3). Ventricular Bigeminy and Trigeminy were present. MD notification criteria for Slow Atrial Fibrillation & Symptomatic Bradycardia met - report posted prior to notification per account request (SE).  SUMMARY: monitor shows slow atrial fibrillation with average HR 49 bpm. At the slowest heart rates with ventricular rates in the 30's, the rhythm appears to be a competing junctional pacemaker. Occasional PVC's noted. No tachyarrhythmias. Recommend EP evaluation.   CT SCANS  CT CORONARY FRACTIONAL FLOW RESERVE DATA PREP 09/24/2019  Narrative CLINICAL DATA:  Chest pain  EXAM: CT FFR  MEDICATIONS: No additional meds.  TECHNIQUE: The CTA was sent for FFR.  FINDINGS: No modeled stenoses > 30%.  IMPRESSION: No evidence for hemodynamically significant coronary disease.  Dalton Mclean   Electronically Signed By: Peder Bourdon M.D. On: 09/25/2019 17:26   CT SCANS  CT CORONARY MORPH W/CTA COR W/SCORE 09/23/2019  Addendum 09/23/2019  5:00 PM ADDENDUM REPORT: 09/23/2019 13:35  CLINICAL DATA:  Chest pain  EXAM: Cardiac CTA  MEDICATIONS: Sub lingual nitro. 4mg  x 2  TECHNIQUE: The patient was scanned on a Siemens 192 slice scanner. Gantry rotation speed was 250 msecs. Collimation was 0.6 mm. A 100 kV prospective scan was triggered in the ascending thoracic aorta at 35-75% of the R-R interval. Average  HR during the scan was 60 bpm. The 3D data set was interpreted on a dedicated work station using MPR, MIP and VRT modes. A total of 80cc of contrast was used.  FINDINGS: Non-cardiac: See separate report from [facility].  Pulmonary veins drain  normally to the left atrium.  Calcium  Score: 546 Agatston units.  Coronary Arteries: Right dominant with no anomalies  LM: Mixed plaque ostial left main, minimal stenosis.  LAD system: Calcified plaque proximal LAD, mild (<50%) stenosis. Mixed plaque mid LAD, possible moderate (51-69%) stenosis.  Circumflex system: Mixed plaque proximal LCx, mild (<50%) stenosis. Misregistration artifact noted.  RCA system: Mixed plaque proximal and mid RCA, no more than mild (<50%) stenosis.  IMPRESSION: 1. Coronary artery calcium  score 546 Agatston units. This places the patient in the 70th percentile for age and gender, suggesting intermediate risk for future cardiac events.  2.  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  3.  Possible moderate mid LAD stenosis.  Will send for FFR.  Dalton Mclean   Electronically Signed By: Peder Bourdon M.D. On: 09/23/2019 13:35  Narrative EXAM: OVER-READ INTERPRETATION  CT CHEST  The following report is an over-read performed by radiologist Dr. Alexandria Angel of [facility] on 09/23/2019. This over-read does not include interpretation of cardiac or coronary anatomy or pathology. The coronary calcium  score/coronary CTA interpretation by the cardiologist is attached.  COMPARISON:  None.  FINDINGS: Aortic atherosclerosis. Several small pulmonary nodules are noted throughout the visualize lungs, largest of which is in the posterior aspect of the left lower lobe (axial image 22 of series 12) measuring 1.0 x 0.6 cm (mean diameter of 0.8 cm). Within the visualized portions of the thorax there are no other larger more suspicious appearing pulmonary nodules or masses, there is no acute consolidative airspace disease, no pleural effusions, no pneumothorax and no lymphadenopathy. Visualized portions of the upper abdomen are unremarkable. There are no aggressive appearing lytic or blastic lesions noted in the visualized portions of  the skeleton.  IMPRESSION: 1. Multiple small pulmonary nodules in the visualize lungs measuring 8 mm or less in size. Non-contrast chest CT at 3-6 months is recommended. If the nodules are stable at time of repeat CT, then future CT at 18-24 months (from today's scan) is considered optional for low-risk patients, but is recommended for high-risk patients. This recommendation follows the consensus statement: Guidelines for Management of Incidental Pulmonary Nodules Detected on CT Images: From the Fleischner Society 2017; Radiology 2017; 284:228-243. 2.  Aortic Atherosclerosis (ICD10-I70.0).  Electronically Signed: By: Alexandria Angel M.D. On: 09/23/2019 08:31     ______________________________________________________________________________________________      EKG:        Recent Labs: No results found for requested labs within last 365 days.  Recent Lipid Panel    Component Value Date/Time   CHOL 125 02/20/2021 0824   TRIG 84 02/20/2021 0824   HDL 45 02/20/2021 0824   CHOLHDL 2.8 02/20/2021 0824   CHOLHDL 3.0 01/10/2016 0759   VLDL 19 01/10/2016 0759   LDLCALC 64 02/20/2021 0824   LDLDIRECT 154.8 08/26/2013 0739     Risk Assessment/Calculations:    CHA2DS2-VASc Score = 4   This indicates a 4.8% annual risk of stroke. The patient's score is based upon: CHF History: 0 HTN History: 1 Diabetes History: 0 Stroke History: 0 Vascular Disease History: 1 Age Score: 2 Gender Score: 0               Physical Exam:  VS:  BP 124/70   Pulse (!) 43   Ht 6' (1.829 m)   Wt 183 lb 12.8 oz (83.4 kg)   SpO2 94%   BMI 24.93 kg/m     Wt Readings from Last 3 Encounters:  04/13/24 183 lb 12.8 oz (83.4 kg)  10/18/23 188 lb 6.4 oz (85.5 kg)  04/10/23 189 lb 12.5 oz (86.1 kg)     GEN:  Well nourished, well developed in no acute distress HEENT: Normal NECK: No JVD; No carotid bruits LYMPHATICS: No lymphadenopathy CARDIAC: bradycardic and regular, no murmurs,  rubs, gallops RESPIRATORY:  Clear to auscultation without rales, wheezing or rhonchi  ABDOMEN: Soft, non-tender, non-distended MUSCULOSKELETAL:  No edema; No deformity  SKIN: Warm and dry NEUROLOGIC:  Alert and oriented x 3 PSYCHIATRIC:  Normal affect   Assessment & Plan Persistent atrial fibrillation [facility]) Patient with longstanding persistent atrial fibrillation with slow ventricular response.  He has undergone formal EP evaluation with recommendation for continued observation and no firm indication for permanent pacemaker implantation.  Tolerating apixaban  for anticoagulation.  Follow-up 6 months.  Counseled him about potential symptoms of bradycardia and he understands to seek immediate medical attention if he has presyncope or syncope. Mixed hyperlipidemia Treated with rosuvastatin .  LDL cholesterol 55.  LFTs are within normal limits.  Lipids are followed by his PCP and I have reviewed through care everywhere. Essential hypertension Treated with hydrochlorothiazide  and losartan .  Normal renal function and electrolytes with labs reviewed through care everywhere. Bilateral carotid artery stenosis Patient with less than 40% ICA stenosis, no bruit, last duplex reviewed from 2023. Atrial fibrillation, unspecified type (HCC)             Medication Adjustments/Labs and Tests Ordered: Current medicines are reviewed at length with the patient today.  Concerns regarding medicines are outlined above.  No orders of the defined types were placed in this encounter.  Meds ordered this encounter  Medications   apixaban  (ELIQUIS ) 5 MG TABS tablet    Sig: Take 1 tablet (5 mg total) by mouth 2 (two) times daily.    Dispense:  180 tablet    Refill:  3    Patient Instructions  Follow-Up: At [facility], you and your health needs are our priority.  As part of our continuing mission to provide you with exceptional heart care, our providers are all part of one team.  This team includes  your primary Cardiologist (physician) and Advanced Practice Providers or APPs (Physician Assistants and Nurse Practitioners) who all work together to provide you with the care you need, when you need it.  Your next appointment:   6 month(s)  Provider:   Arnoldo Lapping, MD       Signed, Arnoldo Lapping, MD  04/13/2024 2:37 PM     [facility]

## 2024-05-05 ENCOUNTER — Ambulatory Visit (INDEPENDENT_AMBULATORY_CARE_PROVIDER_SITE_OTHER): Admitting: Family

## 2024-05-05 ENCOUNTER — Encounter: Payer: Self-pay | Admitting: Family

## 2024-05-05 DIAGNOSIS — L84 Corns and callosities: Secondary | ICD-10-CM

## 2024-05-05 DIAGNOSIS — M6702 Short Achilles tendon (acquired), left ankle: Secondary | ICD-10-CM | POA: Diagnosis not present

## 2024-05-05 DIAGNOSIS — M205X2 Other deformities of toe(s) (acquired), left foot: Secondary | ICD-10-CM | POA: Diagnosis not present

## 2024-05-05 NOTE — Progress Notes (Signed)
 Office Visit Note   Patient: Leroy Lara           Date of Birth: [birth year]           MRN: [id number] Visit Date: 05/05/2024              Requested by: Lory Rough., PA-C [address],  [place] [address] PCP: Lory Rough., PA-C  No chief complaint on file.     HPI: The patient is a 78 year old gentleman who presents today complaining of painful area beneath the left foot this has come on over the last several months and has been gradually worsening he denies any injury there is no skin breakdown that he is aware of he has purchased the metatarsal pad at the pharmacy which provides minimal relief  Assessment & Plan: Visit Diagnoses: No diagnosis found.  Plan: Return demonstration of heel cord stretching.  Patient voiced understanding he will also purchase sole orthotics to support the arch.  He will follow-up in the office for repeat debridement as needed  Follow-Up Instructions: No follow-ups on file.   Ortho Exam  Patient is alert, oriented, no adenopathy, well-dressed, normal affect, normal respiratory effort. On examination left foot there is Wagner grade 1 ulcer beneath the third metatarsal head there is buildup of hyperkeratotic tissue as well this debrided with a 10 blade knife back to viable tissue there is flexible clawing of the lesser toes  Heel cord tightness with dorsiflexion just shy of neutral  Imaging: No results found. No images are attached to the encounter.  Labs: No results found for: "HGBA1C", "ESRSEDRATE", "CRP", "LABURIC", "REPTSTATUS", "GRAMSTAIN", "CULT", "LABORGA"   Lab Results  Component Value Date   ALBUMIN 3.5 (L) 02/20/2021   ALBUMIN 4.0 06/27/2020   ALBUMIN 3.8 09/01/2019    No results found for: "MG" No results found for: "VD25OH"  No results found for: "PREALBUMIN"    Latest Ref Rng & Units 03/30/2022    7:29 AM 02/20/2021    8:24 AM 06/27/2020    4:00 PM  CBC EXTENDED  WBC 3.4 - 10.8 x10E3/uL 7.0  7.9   10.2   RBC 4.14 - 5.80 x10E6/uL 4.02  4.39  4.39   Hemoglobin 13.0 - 17.7 g/dL 62.1  30.8  65.7   HCT 37.5 - 51.0 % 39.5  42.1  42.5   Platelets 150 - 450 x10E3/uL 198  220  249   NEUT# 1.4 - 7.0 x10E3/uL  5.7    Lymph# 0.7 - 3.1 x10E3/uL  1.1       There is no height or weight on file to calculate BMI.  Orders:  No orders of the defined types were placed in this encounter.  No orders of the defined types were placed in this encounter.    Procedures: No procedures performed  Clinical Data: No additional findings.  ROS:  All other systems negative, except as noted in the HPI. Review of Systems  Objective: Vital Signs: There were no vitals taken for this visit.  Specialty Comments:  No specialty comments available.  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   Right hip pain 01/29/2017   Left hip pain 01/29/2017   Abnormal EKG 01/28/2012   Hypertension, benign 01/28/2012   Right carotid bruit 01/28/2012   Past Medical History:  Diagnosis Date   AAA (abdominal aortic aneurysm) (HCC)    Atrial fibrillation, permanent (HCC)  Carotid artery calcification, bilateral    Essential hypertension    Gastroesophageal reflux disease    Vocal cord mass     Family History  Problem Relation Age of Onset   Coronary artery disease Mother 58       myocardial infarction   Cancer - Lung Father    Heart failure Sister    Pulmonary fibrosis Brother    Lymphoma Brother 37    Past Surgical History:  Procedure Laterality Date   MICROLARYNGOSCOPY WITH CO2 LASER AND EXCISION OF VOCAL CORD LESION N/A 11/12/2022   Procedure: MICROLARYNGOSCOPY WITH EXCISION OF VOCAL CORD LESION;  Surgeon: Janita Mellow, MD;  Location: [facility];  Service: ENT;  Laterality: N/A;   Social History   Occupational History   Not on file  Tobacco Use   Smoking status: Former   Smokeless tobacco: Never  Vaping  Use   Vaping status: Never Used  Substance and Sexual Activity   Alcohol use: Never   Drug use: Never   Sexual activity: Not on file

## 2024-05-14 ENCOUNTER — Telehealth: Payer: Self-pay | Admitting: Physical Medicine and Rehabilitation

## 2024-05-14 NOTE — Telephone Encounter (Signed)
 Patient called and said he wants to get an appointment for his back pain. CB# [phone]

## 2024-05-27 ENCOUNTER — Encounter: Payer: Self-pay | Admitting: Physical Medicine and Rehabilitation

## 2024-05-27 ENCOUNTER — Ambulatory Visit (INDEPENDENT_AMBULATORY_CARE_PROVIDER_SITE_OTHER): Admitting: Physical Medicine and Rehabilitation

## 2024-05-27 DIAGNOSIS — G8929 Other chronic pain: Secondary | ICD-10-CM | POA: Diagnosis not present

## 2024-05-27 DIAGNOSIS — M47817 Spondylosis without myelopathy or radiculopathy, lumbosacral region: Secondary | ICD-10-CM | POA: Diagnosis not present

## 2024-05-27 DIAGNOSIS — M545 Low back pain, unspecified: Secondary | ICD-10-CM

## 2024-05-27 DIAGNOSIS — M47816 Spondylosis without myelopathy or radiculopathy, lumbar region: Secondary | ICD-10-CM | POA: Diagnosis not present

## 2024-05-27 NOTE — Progress Notes (Signed)
 Pain Scale   Average Pain 5 Patient advised he has chronic lower back pain radiating to his right hip area        +Driver, -BT, -Dye Allergies.

## 2024-05-27 NOTE — Progress Notes (Signed)
 BELL CAI - 78 y.o. male MRN [id number]  Date of birth: [birth year]  Office Visit Note: Visit Date: 05/27/2024 PCP: Lory Rough., PA-C Referred by: Lory Rough., PA-C  Subjective: Chief Complaint  Patient presents with   Lower Back - Pain   HPI: Leroy Lara is a 78 y.o. male who comes in today for evaluation of chronic, worsening and severe bilateral lower back pain radiating up to more upper lumbar region. This specific pain ongoing for about 1 year. He reports chronic issues with his sciatic pain down right leg, however lower back is most bothersome at this time. His pain worsens with bending and standing. Household tasks such as washing dishes also causes severe pain. He describes pain as sharp and stabbing sensation, currently rates as 8 out of 10. Some relief of pain with home exercise regimen, rest and use of medications. No history of formal physical therapy. He is disciplined and performs lower back exercises daily. Lumbar MRI imaging from 2023 shows degenerative changes, most prominent at L4-L5 and L5-S1. There is facet arthropathy at these levels. Moderate narrowing of spinal canal at L4-L5. He underwent right L4 transforaminal epidural steroid injection in our office on 10/18/2022, some relief of pain for about 2 weeks with this procedure. Patient denies focal weakness, numbness and tingling. No recent trauma or falls.      Review of Systems  Musculoskeletal:  Positive for back pain.  Neurological:  Negative for tingling, sensory change, focal weakness and weakness.  All other systems reviewed and are negative.  Otherwise per HPI.  Assessment & Plan: Visit Diagnoses:    ICD-10-CM   1. Chronic bilateral low back pain without sciatica  M54.50 Ambulatory referral to Physical Therapy   G89.29     2. Spondylosis without myelopathy or radiculopathy, lumbosacral region  M47.817 Ambulatory referral to Physical Therapy    3. Facet arthropathy, lumbar  M47.816  Ambulatory referral to Physical Therapy       Plan: Findings:  Chronic, worsening and severe bilateral lower back pain radiating up to more upper lumbar region. Chronic radicular pain down right leg that is not bothersome at this time. Patient continues to have severe pain despite good conservative therapies such as home exercise regimen, rest and use of medications. Patients clinical presentation and exam are consistent with facet mediated pain. He does have pain with lumbar extension on exam today. We discussed possibility of bilateral L4-L5 and L5-S1 facet joint injections, we also discussed short course of formal physical therapy. He would like to proceed with PT at this time, will have him back for follow up in about 8 weeks. Should his pain persist would recommend facet joint injections. He has no questions at this time. No red flag symptoms noted upon exam today.     Meds & Orders: No orders of the defined types were placed in this encounter.   Orders Placed This Encounter  Procedures   Ambulatory referral to Physical Therapy    Follow-up: Return for 8 week re-evaluation post physical therapy.   Procedures: No procedures performed      Clinical History: Narrative & Impression CLINICAL DATA:  Lumbar radiculopathy. Low back pain radiating to right hip for a few years.   EXAM: MRI LUMBAR SPINE WITHOUT CONTRAST   TECHNIQUE: Multiplanar, multisequence MR imaging of the lumbar spine was performed. No intravenous contrast was administered.   COMPARISON:  MR lumbar 06/16/2014; X-ray lumbar 03/21/2021.   FINDINGS: Segmentation:  Standard.N   Alignment:  Physiologic.   Vertebrae:  No fracture, evidence of discitis, or bone lesion.   Conus medullaris and cauda equina: Conus extends to the L2 level. Conus and cauda equina appear normal.   Paraspinal and other soft tissues: Negative.   Disc spaces:   T12-L1: No significant disc bulge. No neural foraminal stenosis. No central  canal stenosis.   L1-L2: No significant disc bulge. No neural foraminal stenosis. No central canal stenosis. Facet joint arthropathy.   L2-L3: Circumferential disc protrusion and ligamentum flavum hypertrophy without significant spinal canal narrowing. Mild narrowing of lateral recesses bilaterally. No significant neural foraminal narrowing.   L3-L4: Circumferential disc protrusion and ligamentum flavum hypertrophy with mild narrowing of spinal canal. Narrowing of lateral recesses bilaterally. No significant neural foraminal narrowing.   L4-L5: Moderate disc protrusion and ligamentum flavum hypertrophy with moderate narrowing of spinal canal. Mild left and moderate right neural foraminal narrowing.   L5-S1: Circumferential disc protrusion and ligamentum flavum hypertrophy. Bilateral facet joint arthropathy. Mild narrowing of left neural foramen.   IMPRESSION: 1.  No evidence of fracture or subluxation.   2. 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     Electronically Signed   By: Imran  Ahmed D.O.   On: 01/09/2022 11:20   He reports that he has quit smoking. He has never used smokeless tobacco. No results for input(s): HGBA1C, LABURIC in the last 8760 hours.  Objective:  VS:  HT:    WT:   BMI:     BP:   HR: bpm  TEMP: ( )  RESP:  Physical Exam Vitals and nursing note reviewed.  HENT:     Head: Normocephalic and atraumatic.     Right Ear: External ear normal.     Left Ear: External ear normal.     Mouth/Throat:     Mouth: Mucous membranes are moist.   Eyes:     Extraocular Movements: Extraocular movements intact.    Cardiovascular:     Rate and Rhythm: Normal rate.     Pulses: Normal pulses.  Pulmonary:     Effort: Pulmonary effort is normal.  Abdominal:     General: Abdomen is flat. There is no distension.   Musculoskeletal:        General: Tenderness present.     Cervical back: Normal range of  motion.     Comments: Patient rises from seated position to standing without difficulty. Pain noted with facet loading and lumbar extension. 5/5 strength noted with bilateral hip flexion, knee flexion/extension, ankle dorsiflexion/plantarflexion and EHL. No clonus noted bilaterally. No pain upon palpation of greater trochanters. No pain with internal/external rotation of bilateral hips. Sensation intact bilaterally. Negative slump test bilaterally. Ambulates without aid, gait steady.      Skin:    General: Skin is warm and dry.     Capillary Refill: Capillary refill takes less than 2 seconds.   Neurological:     General: No focal deficit present.     Mental Status: He is alert and oriented to person, place, and time.   Psychiatric:        Mood and Affect: Mood normal.        Behavior: Behavior normal.     Ortho Exam  Imaging: No results found.  Past Medical/Family/Surgical/Social History: Medications & Allergies reviewed per EMR, new medications updated. Patient Active Problem List   Diagnosis Date Noted   Carpal tunnel syndrome of left wrist 06/16/2018   Right hip pain  01/29/2017   Left hip pain 01/29/2017   Abnormal EKG 01/28/2012   Hypertension, benign 01/28/2012   Right carotid bruit 01/28/2012   Past Medical History:  Diagnosis Date   AAA (abdominal aortic aneurysm) (HCC)    Atrial fibrillation, permanent (HCC)    Carotid artery calcification, bilateral    Essential hypertension    Gastroesophageal reflux disease    Vocal cord mass    Family History  Problem Relation Age of Onset   Coronary artery disease Mother 21       myocardial infarction   Cancer - Lung Father    Heart failure Sister    Pulmonary fibrosis Brother    Lymphoma Brother 37   Past Surgical History:  Procedure Laterality Date   MICROLARYNGOSCOPY WITH CO2 LASER AND EXCISION OF VOCAL CORD LESION N/A 11/12/2022   Procedure: MICROLARYNGOSCOPY WITH EXCISION OF VOCAL CORD LESION;  Surgeon: Janita Mellow, MD;  Location: [facility];  Service: ENT;  Laterality: N/A;   Social History   Occupational History   Not on file  Tobacco Use   Smoking status: Former   Smokeless tobacco: Never  Vaping Use   Vaping status: Never Used  Substance and Sexual Activity   Alcohol use: Never   Drug use: Never   Sexual activity: Not on file

## 2024-06-02 ENCOUNTER — Encounter: Payer: Self-pay | Admitting: Cardiovascular Disease

## 2024-06-02 ENCOUNTER — Ambulatory Visit (INDEPENDENT_AMBULATORY_CARE_PROVIDER_SITE_OTHER): Admitting: Family

## 2024-06-02 ENCOUNTER — Encounter: Payer: Self-pay | Admitting: Family

## 2024-06-02 DIAGNOSIS — G5603 Carpal tunnel syndrome, bilateral upper limbs: Secondary | ICD-10-CM

## 2024-06-02 DIAGNOSIS — G5602 Carpal tunnel syndrome, left upper limb: Secondary | ICD-10-CM | POA: Diagnosis not present

## 2024-06-02 MED ORDER — DICLOFENAC SODIUM 1 % EX GEL: 2.0000 g | Freq: Four times a day (QID) | CUTANEOUS | 2 refills | Status: AC | PRN

## 2024-06-02 NOTE — Progress Notes (Signed)
 Office Visit Note   Patient: Leroy Lara           Date of Birth: 02/08/1946           MRN: [id number] Visit Date: 06/02/2024              Requested by: Debrah Josette ORN., PA-C [address],  [place] [address] PCP: Debrah Josette ORN., PA-C  Chief Complaint  Patient presents with   Left Hand - Pain      HPI: The patient is a 78 year old gentleman who is seen for evaluation of bilateral hip pain.  Left worse than right.  Does have a history of carpal tunnel on the left This has been ongoing for about 6 weeks now.  He did experience some worsening after June 13 when he was doing some work lifting something bilateral hands felt some intense pain in the palmar aspect of the hand and wrist.  He was recommended to be given wearing a wrist splint by his primary care provider he has noticed moderate improvement in the last 1 week.  Was not able to begin using topical anti-inflammatories yet.  Is open to trying this  Has history of carpal tunnel release on the left several years back.  He has begun having similar symptoms to the right wrist  Is right-hand dominant  Assessment & Plan: Visit Diagnoses: No diagnosis found.  Plan: Bilateral carpal tunnel syndrome.   Plan to consider depomedrol injection at follow up in no improvement with brace wear and NSAIDs.   Voltaren sent to pharmacy  Follow-Up Instructions: No follow-ups on file.   Right Hand Exam   Tenderness  The patient is experiencing tenderness in the palmar area.  Range of Motion  The patient has normal right wrist ROM.   Muscle Strength  Grip: 5/5   Tests  Tinel's sign (median nerve): positive Finkelstein's test: negative  Other  Pulse: present   Left Hand Exam   Tenderness  The patient is experiencing tenderness in the palmar area.   Range of Motion  Wrist  Flexion:  abnormal   Muscle Strength  Grip:  5/5   Tests  Phalen's sign: positive Tinel's sign (median nerve):  positive Finkelstein's test: negative  Other  Pulse: present      Patient is alert, oriented, no adenopathy, well-dressed, normal affect, normal respiratory effort.    Imaging: No results found. No images are attached to the encounter.  Labs: No results found for: HGBA1C, ESRSEDRATE, CRP, LABURIC, REPTSTATUS, GRAMSTAIN, CULT, LABORGA   Lab Results  Component Value Date   ALBUMIN 3.5 (L) 02/20/2021   ALBUMIN 4.0 06/27/2020   ALBUMIN 3.8 09/01/2019    No results found for: MG No results found for: VD25OH  No results found for: PREALBUMIN    Latest Ref Rng & Units 03/30/2022    7:29 AM 02/20/2021    8:24 AM 06/27/2020    4:00 PM  CBC EXTENDED  WBC 3.4 - 10.8 x10E3/uL 7.0  7.9  10.2   RBC 4.14 - 5.80 x10E6/uL 4.02  4.39  4.39   Hemoglobin 13.0 - 17.7 g/dL 86.8  85.5  85.5   HCT 37.5 - 51.0 % 39.5  42.1  42.5   Platelets 150 - 450 x10E3/uL 198  220  249   NEUT# 1.4 - 7.0 x10E3/uL  5.7    Lymph# 0.7 - 3.1 x10E3/uL  1.1       There is no height or weight on file to  calculate BMI.  Orders:  No orders of the defined types were placed in this encounter.  No orders of the defined types were placed in this encounter.    Procedures: No procedures performed  Clinical Data: No additional findings.  ROS:  All other systems negative, except as noted in the HPI. Review of Systems  Objective: Vital Signs: There were no vitals taken for this visit.  Specialty Comments:  Narrative & Impression CLINICAL DATA:  Lumbar radiculopathy. Low back pain radiating to right hip for a few years.   EXAM: MRI LUMBAR SPINE WITHOUT CONTRAST   TECHNIQUE: Multiplanar, multisequence MR imaging of the lumbar spine was performed. No intravenous contrast was administered.   COMPARISON:  MR lumbar 06/16/2014; X-ray lumbar 03/21/2021.   FINDINGS: Segmentation:  Standard.N   Alignment:  Physiologic.   Vertebrae:  No fracture, evidence of discitis, or  bone lesion.   Conus medullaris and cauda equina: Conus extends to the L2 level. Conus and cauda equina appear normal.   Paraspinal and other soft tissues: Negative.   Disc spaces:   T12-L1: No significant disc bulge. No neural foraminal stenosis. No central canal stenosis.   L1-L2: No significant disc bulge. No neural foraminal stenosis. No central canal stenosis. Facet joint arthropathy.   L2-L3: Circumferential disc protrusion and ligamentum flavum hypertrophy without significant spinal canal narrowing. Mild narrowing of lateral recesses bilaterally. No significant neural foraminal narrowing.   L3-L4: Circumferential disc protrusion and ligamentum flavum hypertrophy with mild narrowing of spinal canal. Narrowing of lateral recesses bilaterally. No significant neural foraminal narrowing.   L4-L5: Moderate disc protrusion and ligamentum flavum hypertrophy with moderate narrowing of spinal canal. Mild left and moderate right neural foraminal narrowing.   L5-S1: Circumferential disc protrusion and ligamentum flavum hypertrophy. Bilateral facet joint arthropathy. Mild narrowing of left neural foramen.   IMPRESSION: 1.  No evidence of fracture or subluxation.   2. 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     Electronically Signed   By: Imran  Ahmed D.O.   On: 01/09/2022 11:20  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   Right hip pain 01/29/2017   Left hip pain 01/29/2017   Abnormal EKG 01/28/2012   Hypertension, benign 01/28/2012   Right carotid bruit 01/28/2012   Past Medical History:  Diagnosis Date   AAA (abdominal aortic aneurysm) (HCC)    Atrial fibrillation, permanent (HCC)    Carotid artery calcification, bilateral    Essential hypertension    Gastroesophageal reflux disease    Vocal cord mass     Family History  Problem  Relation Age of Onset   Coronary artery disease Mother 89       myocardial infarction   Cancer - Lung Father    Heart failure Sister    Pulmonary fibrosis Brother    Lymphoma Brother 37    Past Surgical History:  Procedure Laterality Date   MICROLARYNGOSCOPY WITH CO2 LASER AND EXCISION OF VOCAL CORD LESION N/A 11/12/2022   Procedure: MICROLARYNGOSCOPY WITH EXCISION OF VOCAL CORD LESION;  Surgeon: Jesus Oliphant, MD;  Location: [facility];  Service: ENT;  Laterality: N/A;   Social History   Occupational History   Not on file  Tobacco Use   Smoking status: Former   Smokeless tobacco: Never  Vaping Use   Vaping status: Never Used  Substance and Sexual Activity   Alcohol  use: Never   Drug use: Never   Sexual activity: Not on file

## 2024-06-10 ENCOUNTER — Encounter: Payer: Self-pay | Admitting: Rehabilitative and Restorative Service Providers"

## 2024-06-10 ENCOUNTER — Ambulatory Visit (INDEPENDENT_AMBULATORY_CARE_PROVIDER_SITE_OTHER): Admitting: Rehabilitative and Restorative Service Providers"

## 2024-06-10 DIAGNOSIS — M6281 Muscle weakness (generalized): Secondary | ICD-10-CM

## 2024-06-10 DIAGNOSIS — M5459 Other low back pain: Secondary | ICD-10-CM | POA: Diagnosis not present

## 2024-06-10 DIAGNOSIS — R293 Abnormal posture: Secondary | ICD-10-CM

## 2024-06-10 NOTE — Therapy (Signed)
 OUTPATIENT PHYSICAL THERAPY THORACOLUMBAR EVALUATION   Patient Name: Leroy Lara MRN: [id number] DOB:[birth year], 78 y.o., male Today's Date: 06/10/2024  END OF SESSION:  PT End of Session - 06/10/24 1832     Visit Number 1    Number of Visits 16    Date for PT Re-Evaluation 08/05/24    Progress Note Due on Visit 10    PT Start Time 0934    PT Stop Time 1019    PT Time Calculation (min) 45 min    Activity Tolerance Patient tolerated treatment well;No increased pain;Patient limited by pain    Behavior During Therapy [facility] for tasks assessed/performed          Past Medical History:  Diagnosis Date   AAA (abdominal aortic aneurysm) (HCC)    Atrial fibrillation, permanent (HCC)    Carotid artery calcification, bilateral    Essential hypertension    Gastroesophageal reflux disease    Vocal cord mass    Past Surgical History:  Procedure Laterality Date   MICROLARYNGOSCOPY WITH CO2 LASER AND EXCISION OF VOCAL CORD LESION N/A 11/12/2022   Procedure: MICROLARYNGOSCOPY WITH EXCISION OF VOCAL CORD LESION;  Surgeon: Jesus Oliphant, MD;  Location: [facility];  Service: ENT;  Laterality: N/A;   Patient Active Problem List   Diagnosis Date Noted   Carpal tunnel syndrome of left wrist 06/16/2018   Right hip pain 01/29/2017   Left hip pain 01/29/2017   Abnormal EKG 01/28/2012   Hypertension, benign 01/28/2012   Right carotid bruit 01/28/2012    PCP: Josette LELON Aho, PA-C  REFERRING PROVIDER: Duwaine FORBES Pouch, NP  REFERRING DIAG: M54.50,G89.29 (ICD-10-CM) - Chronic bilateral low back pain without sciatica M47.817 (ICD-10-CM) - Spondylosis without myelopathy or radiculopathy, lumbosacral region M47.816 (ICD-10-CM) - Facet arthropathy, lumbar  Rationale for Evaluation and Treatment: Rehabilitation  THERAPY DIAG:  Abnormal posture - Plan: PT plan of care cert/re-cert  Other low back pain - Plan: PT plan of care cert/re-cert  Muscle weakness (generalized) - Plan: PT  plan of care cert/re-cert  ONSET DATE: Chronic  SUBJECTIVE:                                                                                                                                                                                           SUBJECTIVE STATEMENT: [name] has a history of right sciatica to the knee.  Symptoms are worse with prolonged sitting.  He is doing a current rehabilitation routine for his back and shoulder.  Bending is bad for his spine.    PERTINENT HISTORY:  A-fib, Aortic aneurysm, Lt CTS, chronic hip and back pain  PAIN:  Are you having pain? Yes: NPRS scale: Low back 2-6/10; Right 0/10 this week Pain location: Low back and hips Pain description: Achy, sore, stiff, can be painful Aggravating factors: Prolonged sitting, poor posture, bending and twisting Relieving factors: Movement  PRECAUTIONS: Back  RED FLAGS: None   WEIGHT BEARING RESTRICTIONS: No  FALLS:  Has patient fallen in last 6 months? No  LIVING ENVIRONMENT: Lives with: lives with their family and lives with their spouse Lives in: House/apartment Stairs: No problems Has following equipment at home: None  OCCUPATION: Working semi-retired  PLOF: Independent  PATIENT GOALS: Be able to function better with less pain and stiffness  NEXT MD VISIT: 07/22/2024  OBJECTIVE:  Note: Objective measures were completed at Evaluation unless otherwise noted.  DIAGNOSTIC FINDINGS:  IMPRESSION: 1.  No evidence of fracture or subluxation.   2. 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  PATIENT SURVEYS:  Patient specific functional scale (0 unable to 10 no difficulty) 1) Prolonged sitting  4/10 2) Bending   3/10 3) Doing dishes 4/10  AVG   3.67  COGNITION: Overall cognitive status: Within functional limits for tasks assessed     SENSATION: WFL  MUSCLE LENGTH: Hamstrings: Right 35 deg; Left 40 deg  POSTURE: rounded shoulders, forward  head, and decreased lumbar lordosis  LUMBAR ROM:   AROM 06/10/2024  Flexion   Extension 5  Right lateral flexion   Left lateral flexion   Right rotation   Left rotation    (Blank rows = not tested)  LOWER EXTREMITY ROM:     Passive  Left/Right 06/10/2024   Hip flexion 90/95   Hip extension    Hip abduction    Hip adduction    Hip internal rotation 7/12   Hip external rotation 21/23   Knee flexion    Knee extension    Ankle dorsiflexion    Ankle plantarflexion    Ankle inversion    Ankle eversion     (Blank rows = not tested)  STRENGTH:    MMT 06/10/2024   Hip flexion    Hip extension    Hip abduction    Spine Strength (Prone Superman) 5 seconds (Goal is 30-60 seconds)   Hip adduction    Hip internal rotation    Hip external rotation    Knee flexion    Knee extension    Ankle dorsiflexion    Ankle plantarflexion    Ankle inversion    Ankle eversion     (Blank rows = not tested)  GAIT: Distance walked: 100 feet Assistive device utilized: None Level of assistance: Complete Independence Comments: No issues with walking, better with movement  TREATMENT DATE: 06/10/2024 Standing lumbar extension (hips forward) 10 x 3 seconds             Scapular retraction/shoulder blade pinches 10 x 5 seconds        Supine hamstrings stretch 4 x 20 seconds Supine figure 4 stretch with push into external rotation 4 x 20 seconds  02464: Posture and body mechanics basics including golfers and diagonal squat lift; body mechanics with dishes; the importance of avoiding flexed and slouched postures; review of imaging; review of examination findings and day 1 home exercise program review  PATIENT EDUCATION:  Education details: See above Person educated: Patient Education method: Explanation, Demonstration, Tactile cues, Verbal cues, and Handouts Education comprehension:  verbalized understanding, returned demonstration, verbal cues required, tactile cues required, and needs further education  HOME EXERCISE PROGRAM: Access Code: [email or web address] URL: [email or web address] Date: 06/10/2024 Prepared by: Lamar Ivory  Exercises - Standing Lumbar Extension at Wall - Forearms  - 5 x daily - 7 x weekly - 1 sets - 5 reps - 3 seconds hold - Standing Scapular Retraction  - 5 x daily - 7 x weekly - 1 sets - 5 reps - 5 second hold - Supine Hamstrings Stretch  - 2-3 x daily - 7 x weekly - 1 sets - 5 reps - 20 seconds hold - Supine Figure 4 Piriformis Stretch  - 2-3 x daily - 7 x weekly - 1 sets - 5 reps - 20 seconds hold  ASSESSMENT:  CLINICAL IMPRESSION: Patient is a 78 y.o. male who was seen today for physical therapy evaluation and treatment for M54.50,G89.29 (ICD-10-CM) - Chronic bilateral low back pain without sciatica M47.817 (ICD-10-CM) - Spondylosis without myelopathy or radiculopathy, lumbosacral region M47.816 (ICD-10-CM) - Facet arthropathy, lumbar.  [name] has a history of chronic low back and hip pain, although sciatica has not been as much of an issue recently.  He has limited lumbar extension AROM, limited bilateral lower extremity flexibility, poor awareness of posture and biomechanics and limited lumbar strength.  He was started on an appropriate physical therapy intervention program today to include posture and body mechanics training, low back strengthening, flexibility and AROM work to meet the below listed goals.  OBJECTIVE IMPAIRMENTS: decreased activity tolerance, decreased endurance, decreased knowledge of condition, decreased ROM, decreased strength, decreased safety awareness, impaired perceived functional ability, impaired flexibility, improper body mechanics, postural dysfunction, and pain.   ACTIVITY LIMITATIONS: carrying, lifting, bending, sitting, sleeping, bed mobility, and dressing  PARTICIPATION LIMITATIONS: driving, community  activity, and occupation  PERSONAL FACTORS: A-fib, Aortic aneurysm, Lt CTS, chronic hip and back pain are also affecting patient's functional outcome.   REHAB POTENTIAL: Good  CLINICAL DECISION MAKING: Stable/uncomplicated  EVALUATION COMPLEXITY: Low   GOALS: Goals reviewed with patient? Yes  SHORT TERM GOALS: Target date: 07/08/2024  [name] will be independent with his day 1 home exercise program Baseline: Started 06/10/2024 Goal status: INITIAL  2.  Improve lumbar extension AROM to 10 degrees Baseline: 5 degrees Goal status: INITIAL  3.  Improve bilateral lower extremity flexibility for hip flexors to 100 degrees; hamstrings to 45 degrees and hip external rotation to 30 degrees Baseline: 90/95; 40/35 and 21/23 respectively Goal status: INITIAL   LONG TERM GOALS: Target date: 08/05/2024  Improve patient specific functional score to at least 6 Baseline: 3.67 Goal status: INITIAL  2.  [name] will report low back and bilateral hip pain consistently 0-3/10 on the numeric pain rating scale Baseline: 2-6/10 Goal status: INITIAL  3.  Improve bilateral hip external rotation active range of motion 40 degrees Baseline: 21/23 degrees Goal status: INITIAL  4.  Improve spine strength as assessed by 30 seconds or more on the prone spine strength test position Baseline: 5 seconds Goal status: INITIAL  5.  [name] will be independent with his long-term maintenance home exercise program at discharge Baseline: Started 06/10/2024 Goal status: INITIAL  PLAN:  PT FREQUENCY: 1-2x/week  PT DURATION: 8 weeks  PLANNED INTERVENTIONS: 97110-Therapeutic exercises, 97530- Therapeutic activity, [id number]- Neuromuscular re-education, 97535- Self Care, 02859- Manual therapy, G0283- Electrical stimulation (unattended), 02987- Traction (mechanical), [phone] (1-2  muscles), 20561 (3+ muscles)- Dry Needling, Patient/Family education, Joint mobilization, Spinal mobilization, Cryotherapy, and Moist heat.  PLAN FOR  NEXT SESSION: Review day 1 home exercise program.  Postural and body mechanics education along with addressing core strength and remaining active range of motion and flexibility impairments.   Myer LELON Ivory, PT, MPT 06/10/2024, 6:55 PM

## 2024-06-17 ENCOUNTER — Ambulatory Visit (INDEPENDENT_AMBULATORY_CARE_PROVIDER_SITE_OTHER): Admitting: Rehabilitative and Restorative Service Providers"

## 2024-06-17 ENCOUNTER — Encounter: Payer: Self-pay | Admitting: Rehabilitative and Restorative Service Providers"

## 2024-06-17 DIAGNOSIS — R293 Abnormal posture: Secondary | ICD-10-CM | POA: Diagnosis not present

## 2024-06-17 DIAGNOSIS — M6281 Muscle weakness (generalized): Secondary | ICD-10-CM | POA: Diagnosis not present

## 2024-06-17 DIAGNOSIS — M5459 Other low back pain: Secondary | ICD-10-CM | POA: Diagnosis not present

## 2024-06-17 NOTE — Therapy (Signed)
 OUTPATIENT PHYSICAL THERAPY THORACOLUMBAR TREATMENT   Patient Name: Leroy Lara MRN: [id number] DOB:October 24, 1946, 78 y.o., male Today's Date: 06/17/2024  END OF SESSION:  PT End of Session - 06/17/24 1347     Visit Number 2    Number of Visits 16    Date for PT Re-Evaluation 08/05/24    Progress Note Due on Visit 10    PT Start Time 1346    PT Stop Time 1433    PT Time Calculation (min) 47 min    Activity Tolerance Patient tolerated treatment well;No increased pain;Patient limited by pain    Behavior During Therapy [facility] for tasks assessed/performed           Past Medical History:  Diagnosis Date   AAA (abdominal aortic aneurysm) (HCC)    Atrial fibrillation, permanent (HCC)    Carotid artery calcification, bilateral    Essential hypertension    Gastroesophageal reflux disease    Vocal cord mass    Past Surgical History:  Procedure Laterality Date   MICROLARYNGOSCOPY WITH CO2 LASER AND EXCISION OF VOCAL CORD LESION N/A 11/12/2022   Procedure: MICROLARYNGOSCOPY WITH EXCISION OF VOCAL CORD LESION;  Surgeon: Jesus Oliphant, MD;  Location: [facility];  Service: ENT;  Laterality: N/A;   Patient Active Problem List   Diagnosis Date Noted   Carpal tunnel syndrome of left wrist 06/16/2018   Right hip pain 01/29/2017   Left hip pain 01/29/2017   Abnormal EKG 01/28/2012   Hypertension, benign 01/28/2012   Right carotid bruit 01/28/2012    PCP: Josette LELON Aho, PA-C  REFERRING PROVIDER: Duwaine FORBES Pouch, NP  REFERRING DIAG: M54.50,G89.29 (ICD-10-CM) - Chronic bilateral low back pain without sciatica M47.817 (ICD-10-CM) - Spondylosis without myelopathy or radiculopathy, lumbosacral region M47.816 (ICD-10-CM) - Facet arthropathy, lumbar  Rationale for Evaluation and Treatment: Rehabilitation  THERAPY DIAG:  Abnormal posture  Other low back pain  Muscle weakness (generalized)  ONSET DATE: Chronic  SUBJECTIVE:                                                                                                                                                                                            SUBJECTIVE STATEMENT: Leroy Lara reports he has been doing a good job with his posture and body mechanics and he notes progress with this.  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  Symptoms are worse with prolonged sitting.  He is doing a current rehabilitation routine for his back and shoulder.  Bending is bad for his spine.  PERTINENT HISTORY:  A-fib, Aortic aneurysm, Lt CTS, chronic hip and back pain  PAIN:  Are you having pain? Yes: NPRS scale: Low back 2-6/10; Right 0/10 this week Pain location: Low back and hips Pain description: Achy, sore, stiff, can be painful Aggravating factors: Prolonged sitting, poor posture, bending and twisting Relieving factors: Movement  PRECAUTIONS: Back  RED FLAGS: None   WEIGHT BEARING RESTRICTIONS: No  FALLS:  Has patient fallen in last 6 months? No  LIVING ENVIRONMENT: Lives with: lives with their family and lives with their spouse Lives in: House/apartment Stairs: No problems Has following equipment at home: None  OCCUPATION: Working semi-retired  PLOF: Independent  PATIENT GOALS: Be able to function better with less pain and stiffness  NEXT MD VISIT: 07/22/2024  OBJECTIVE:  Note: Objective measures were completed at Evaluation unless otherwise noted.  DIAGNOSTIC FINDINGS:  IMPRESSION: 1.  No evidence of fracture or subluxation.   2. 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  PATIENT SURVEYS:  Patient specific functional scale (0 unable to 10 no difficulty) 1) Prolonged sitting  4/10 2) Bending   3/10 3) Doing dishes 4/10  AVG   3.67  COGNITION: Overall cognitive status: Within functional limits for tasks assessed     SENSATION: WFL  MUSCLE  LENGTH: Hamstrings: Right 35 deg; Left 40 deg  POSTURE: rounded shoulders, forward head, and decreased lumbar lordosis  LUMBAR ROM:   AROM 06/10/2024  Flexion   Extension 5  Right lateral flexion   Left lateral flexion   Right rotation   Left rotation    (Blank rows = not tested)  LOWER EXTREMITY ROM:     Passive  Left/Right 06/10/2024   Hip flexion 90/95   Hip extension    Hip abduction    Hip adduction    Hip internal rotation 7/12   Hip external rotation 21/23   Knee flexion    Knee extension    Ankle dorsiflexion    Ankle plantarflexion    Ankle inversion    Ankle eversion     (Blank rows = not tested)  STRENGTH:    MMT 06/10/2024   Hip flexion    Hip extension    Hip abduction    Spine Strength (Prone Superman) 5 seconds (Goal is 30-60 seconds)   Hip adduction    Hip internal rotation    Hip external rotation    Knee flexion    Knee extension    Ankle dorsiflexion    Ankle plantarflexion    Ankle inversion    Ankle eversion     (Blank rows = not tested)  GAIT: Distance walked: 100 feet Assistive device utilized: None Level of assistance: Complete Independence Comments: No issues with walking, better with movement  TREATMENT DATE:  06/17/2024 Standing lumbar extension (hips forward) 10 x 3 seconds             Scapular retraction/shoulder blade pinches (SBP) 10 x 5 seconds        Supine and standing hamstrings stretch 4 x 20 seconds each Supine figure 4 stretch with push into external rotation 4 x 20 seconds Cervical rotation AROM with SBP 10 x 5 seconds Prone alternating hip extension 2 sets of 10 for 3 seconds  02464: Reviewed posture and body mechanics basics including golfers and diagonal squat lift; body mechanics with dishes and brushing teeth; the importance of avoiding flexed and slouched postures; review of changes to home exercise program  06/10/2024 Standing lumbar extension (hips forward) 10 x 3 seconds             Scapular  retraction/shoulder blade pinches 10 x 5 seconds        Supine hamstrings stretch 4 x 20 seconds Supine figure 4 stretch with push into external rotation 4 x 20 seconds  02464: Posture and body mechanics basics including golfers and diagonal squat lift; body mechanics with dishes; the importance of avoiding flexed and slouched postures; review of imaging; review of examination findings and day 1 home exercise program review                                                                                                               PATIENT EDUCATION:  Education details: See above Person educated: Patient Education method: Explanation, Demonstration, Tactile cues, Verbal cues, and Handouts Education comprehension: verbalized understanding, returned demonstration, verbal cues required, tactile cues required, and needs further education  HOME EXERCISE PROGRAM: Access Code: [email or web address] URL: [email or web address] Date: 06/17/2024 Prepared by: Lamar Ivory  Exercises - Standing Lumbar Extension at Wall - Forearms  - 5 x daily - 7 x weekly - 1 sets - 5 reps - 3 seconds hold - Standing Scapular Retraction  - 5 x daily - 7 x weekly - 1 sets - 5 reps - 5 second hold - Supine Figure 4 Piriformis Stretch  - 2-3 x daily - 7 x weekly - 1 sets - 5 reps - 20 seconds hold - Seated Table Hamstring Stretch  - 2-3 x daily - 7 x weekly - 1 sets - 5 reps - 20 seconds hold - Modified Thomas Stretch  - 2-3 x daily - 7 x weekly - 5 reps - 20 seconds hold - Prone Hip Extension  - 1 x daily - 7 x weekly - 2 sets - 10 reps - 3-5 seconds hold - Seated Cervical Rotation AROM  - 2-3 x daily - 7 x weekly - 1 sets - 10 reps - 5 seconds hold  ASSESSMENT:  CLINICAL IMPRESSION: Leroy Lara reports good early results with improvements in his postural and body mechanics awareness.  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  We made modifications due  to this and I gave him 1 cervical rotation active range of motion exercise to try to make him more comfortable at night.  We also progressed shows back strengthening and this will be an emphasis on his next scheduled appointment as well.   Patient is a 78 y.o. male who was seen today for physical therapy evaluation and treatment for M54.50,G89.29 (ICD-10-CM) - Chronic bilateral low back pain without sciatica M47.817 (ICD-10-CM) - Spondylosis without myelopathy or radiculopathy, lumbosacral region M47.816 (ICD-10-CM) - Facet arthropathy, lumbar.  Leroy Lara has a history of chronic low back and hip pain, although sciatica has not been as much of an issue recently.  He has limited lumbar extension AROM, limited bilateral lower extremity flexibility, poor awareness of posture and biomechanics and limited  lumbar strength.  He was started on an appropriate physical therapy intervention program today to include posture and body mechanics training, low back strengthening, flexibility and AROM work to meet the below listed goals.  OBJECTIVE IMPAIRMENTS: decreased activity tolerance, decreased endurance, decreased knowledge of condition, decreased ROM, decreased strength, decreased safety awareness, impaired perceived functional ability, impaired flexibility, improper body mechanics, postural dysfunction, and pain.   ACTIVITY LIMITATIONS: carrying, lifting, bending, sitting, sleeping, bed mobility, and dressing  PARTICIPATION LIMITATIONS: driving, community activity, and occupation  PERSONAL FACTORS: A-fib, Aortic aneurysm, Lt CTS, chronic hip and back pain are also affecting patient's functional outcome.   REHAB POTENTIAL: Good  CLINICAL DECISION MAKING: Stable/uncomplicated  EVALUATION COMPLEXITY: Low   GOALS: Goals reviewed with patient? Yes  SHORT TERM GOALS: Target date: 07/08/2024  Leroy Lara will be independent with his day 1 home exercise program Baseline: Started 06/10/2024 Goal status: On Going  06/17/2024  2.  Improve lumbar extension AROM to 10 degrees Baseline: 5 degrees Goal status: INITIAL  3.  Improve bilateral lower extremity flexibility for hip flexors to 100 degrees; hamstrings to 45 degrees and hip external rotation to 30 degrees Baseline: 90/95; 40/35 and 21/23 respectively Goal status: INITIAL   LONG TERM GOALS: Target date: 08/05/2024  Improve patient specific functional score to at least 6 Baseline: 3.67 Goal status: INITIAL  2.  Leroy Lara will report low back and bilateral hip pain consistently 0-3/10 on the numeric pain rating scale Baseline: 2-6/10 Goal status: INITIAL  3.  Improve bilateral hip external rotation active range of motion 40 degrees Baseline: 21/23 degrees Goal status: INITIAL  4.  Improve spine strength as assessed by 30 seconds or more on the prone spine strength test position Baseline: 5 seconds Goal status: INITIAL  5.  Leroy Lara will be independent with his long-term maintenance home exercise program at discharge Baseline: Started 06/10/2024 Goal status: INITIAL  PLAN:  PT FREQUENCY: 1-2x/week  PT DURATION: 8 weeks  PLANNED INTERVENTIONS: 97110-Therapeutic exercises, 97530- Therapeutic activity, 97112- Neuromuscular re-education, 97535- Self Care, 02859- Manual therapy, G0283- Electrical stimulation (unattended), 97012- Traction (mechanical), 20560 (1-2 muscles), 20561 (3+ muscles)- Dry Needling, Patient/Family education, Joint mobilization, Spinal mobilization, Cryotherapy, and Moist heat.  PLAN FOR NEXT SESSION: Review home exercise program.  Postural and body mechanics education along with progressing low back strength, remaining active range of motion and flexibility impairments.   Myer LELON Ivory, PT, MPT 06/17/2024, 5:19 PM

## 2024-06-25 ENCOUNTER — Ambulatory Visit: Admitting: Orthopedic Surgery

## 2024-06-26 ENCOUNTER — Encounter: Payer: Self-pay | Admitting: Rehabilitative and Restorative Service Providers"

## 2024-06-26 ENCOUNTER — Ambulatory Visit: Admitting: Rehabilitative and Restorative Service Providers"

## 2024-06-26 DIAGNOSIS — M5459 Other low back pain: Secondary | ICD-10-CM

## 2024-06-26 DIAGNOSIS — R293 Abnormal posture: Secondary | ICD-10-CM

## 2024-06-26 DIAGNOSIS — M6281 Muscle weakness (generalized): Secondary | ICD-10-CM

## 2024-06-26 NOTE — Therapy (Signed)
 OUTPATIENT PHYSICAL THERAPY THORACOLUMBAR TREATMENT   Patient Name: Leroy Lara MRN: [id number] DOB:1946/10/21, 78 y.o., male Today's Date: 06/26/2024  END OF SESSION:  PT End of Session - 06/26/24 1018     Visit Number 3    Number of Visits 16    Date for PT Re-Evaluation 08/05/24    Progress Note Due on Visit 10    PT Start Time 1017    PT Stop Time 1107    PT Time Calculation (min) 50 min    Activity Tolerance Patient tolerated treatment well;No increased pain;Patient limited by pain    Behavior During Therapy [facility] for tasks assessed/performed            Past Medical History:  Diagnosis Date   AAA (abdominal aortic aneurysm) (HCC)    Atrial fibrillation, permanent (HCC)    Carotid artery calcification, bilateral    Essential hypertension    Gastroesophageal reflux disease    Vocal cord mass    Past Surgical History:  Procedure Laterality Date   MICROLARYNGOSCOPY WITH CO2 LASER AND EXCISION OF VOCAL CORD LESION N/A 11/12/2022   Procedure: MICROLARYNGOSCOPY WITH EXCISION OF VOCAL CORD LESION;  Surgeon: Jesus Oliphant, MD;  Location: [facility];  Service: ENT;  Laterality: N/A;   Patient Active Problem List   Diagnosis Date Noted   Carpal tunnel syndrome of left wrist 06/16/2018   Right hip pain 01/29/2017   Left hip pain 01/29/2017   Abnormal EKG 01/28/2012   Hypertension, benign 01/28/2012   Right carotid bruit 01/28/2012    PCP: Josette LELON Aho, PA-C  REFERRING PROVIDER: Duwaine FORBES Pouch, NP  REFERRING DIAG: M54.50,G89.29 (ICD-10-CM) - Chronic bilateral low back pain without sciatica M47.817 (ICD-10-CM) - Spondylosis without myelopathy or radiculopathy, lumbosacral region M47.816 (ICD-10-CM) - Facet arthropathy, lumbar  Rationale for Evaluation and Treatment: Rehabilitation  THERAPY DIAG:  Abnormal posture  Other low back pain  Muscle weakness (generalized)  ONSET DATE: Chronic  SUBJECTIVE:                                                                                                                                                                                            SUBJECTIVE STATEMENT: Leroy Lara reports he has been doing a good job with his posture and body mechanics and he notes progress with this.  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  Symptoms are worse with prolonged sitting.  He is doing a current rehabilitation routine for his back and shoulder.  Bending is bad for his spine.  PERTINENT HISTORY:  A-fib, Aortic aneurysm, Lt CTS, chronic hip and back pain  PAIN:  Are you having pain? Yes: NPRS scale: Low back 2-6/10; Right 0/10 this week Pain location: Low back and hips Pain description: Achy, sore, stiff, can be painful Aggravating factors: Prolonged sitting, poor posture, bending and twisting Relieving factors: Movement  PRECAUTIONS: Back  RED FLAGS: None   WEIGHT BEARING RESTRICTIONS: No  FALLS:  Has patient fallen in last 6 months? No  LIVING ENVIRONMENT: Lives with: lives with their family and lives with their spouse Lives in: House/apartment Stairs: No problems Has following equipment at home: None  OCCUPATION: Working semi-retired  PLOF: Independent  PATIENT GOALS: Be able to function better with less pain and stiffness  NEXT MD VISIT: 07/22/2024  OBJECTIVE:  Note: Objective measures were completed at Evaluation unless otherwise noted.  DIAGNOSTIC FINDINGS:  IMPRESSION: 1.  No evidence of fracture or subluxation.   2. 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  PATIENT SURVEYS:  Patient specific functional scale (0 unable to 10 no difficulty) 1) Prolonged sitting  4/10 2) Bending   3/10 3) Doing dishes 4/10  AVG   3.67  COGNITION: Overall cognitive status: Within functional limits for tasks assessed     SENSATION: [facility]  MUSCLE  LENGTH: 06/26/2024: Hamstrings: Right 50 deg; Left 50 deg  Eval: Hamstrings: Right 35 deg; Left 40 deg  POSTURE: rounded shoulders, forward head, and decreased lumbar lordosis  LUMBAR ROM:   AROM 06/10/2024  Flexion   Extension 5  Right lateral flexion   Left lateral flexion   Right rotation   Left rotation    (Blank rows = not tested)  LOWER EXTREMITY ROM:     Passive  Left/Right 06/10/2024 Left/Right 06/26/2024  Hip flexion 90/95 110/110  Hip extension    Hip abduction    Hip adduction    Hip internal rotation 7/12 10/16  Hip external rotation 21/23 34/32  Knee flexion    Knee extension    Ankle dorsiflexion    Ankle plantarflexion    Ankle inversion    Ankle eversion     (Blank rows = not tested)  STRENGTH:    MMT 06/10/2024   Hip flexion    Hip extension    Hip abduction    Spine Strength (Prone Superman) 5 seconds (Goal is 30-60 seconds)   Hip adduction    Hip internal rotation    Hip external rotation    Knee flexion    Knee extension    Ankle dorsiflexion    Ankle plantarflexion    Ankle inversion    Ankle eversion     (Blank rows = not tested)  GAIT: Distance walked: 100 feet Assistive device utilized: None Level of assistance: Complete Independence Comments: No issues with walking, better with movement  TREATMENT DATE:  06/26/2024 Standing lumbar extension (hips forward) 10 x 3 seconds             Scapular retraction/shoulder blade pinches (SBP) 10 x 5 seconds        Supine hamstrings stretch with strap 4 x 20 seconds Supine figure 4 stretch with push into external rotation 4 x 20 seconds Cervical rotation AROM with SBP 10 x 5 seconds Prone alternating hip extension 2 sets of 10 for 5 seconds Hip hike in doorframe 10 x 3 seconds bilaterally  97535: Briefly reviewed posture and body mechanics basics including golfers and diagonal squat lift; body mechanics with  dishes and brushing teeth; the importance of avoiding flexed and slouched postures;  review of changes to home exercise program   06/17/2024 Standing lumbar extension (hips forward) 10 x 3 seconds             Scapular retraction/shoulder blade pinches (SBP) 10 x 5 seconds        Supine and standing hamstrings stretch 4 x 20 seconds each Supine figure 4 stretch with push into external rotation 4 x 20 seconds Cervical rotation AROM with SBP 10 x 5 seconds Prone alternating hip extension 2 sets of 10 for 3 seconds  02464: Reviewed posture and body mechanics basics including golfers and diagonal squat lift; body mechanics with dishes and brushing teeth; the importance of avoiding flexed and slouched postures; review of changes to home exercise program   06/10/2024 Standing lumbar extension (hips forward) 10 x 3 seconds             Scapular retraction/shoulder blade pinches 10 x 5 seconds        Supine hamstrings stretch 4 x 20 seconds Supine figure 4 stretch with push into external rotation 4 x 20 seconds  02464: Posture and body mechanics basics including golfers and diagonal squat lift; body mechanics with dishes; the importance of avoiding flexed and slouched postures; review of imaging; review of examination findings and day 1 home exercise program review                                                                                                               PATIENT EDUCATION:  Education details: See above Person educated: Patient Education method: Explanation, Demonstration, Tactile cues, Verbal cues, and Handouts Education comprehension: verbalized understanding, returned demonstration, verbal cues required, tactile cues required, and needs further education  HOME EXERCISE PROGRAM: Access Code: [email or web address] URL: [email or web address] Date: 06/26/2024 Prepared by: Lamar Lara  Exercises - Standing Lumbar Extension at Wall - Forearms  - 5 x daily - 7 x weekly - 1 sets - 5 reps - 3 seconds hold - Standing Scapular Retraction  - 5 x daily - 7 x  weekly - 1 sets - 5 reps - 5 second hold - Supine Figure 4 Piriformis Stretch  - 2 x daily - 7 x weekly - 1 sets - 5 reps - 20 seconds hold - Prone Hip Extension  - 1 x daily - 7 x weekly - 2 sets - 10 reps - 3-5 seconds hold - Seated Cervical Rotation AROM  - 2-3 x daily - 7 x weekly - 1 sets - 10 reps - 5 seconds hold - Supine Hamstring Stretch with Strap  - 2 x daily - 7 x weekly - 1 sets - 5 reps - 20 seconds hold - Standing Hip Hiking  - 3 x daily - 7 x weekly - 1 sets - 10 reps - 5 seconds hold  ASSESSMENT:  CLINICAL IMPRESSION: Leroy Lara continues to report good compliance with his postural and body mechanics awareness.  Leroy Lara did mention that his only back pain this week  was when he slipped back into some bad habits with his body mechanics.  We made some progressions to his core strengthening today which should help him meet long-term goals within the anticipated plan of care.  Patient is a 78 y.o. male who was seen today for physical therapy evaluation and treatment for M54.50,G89.29 (ICD-10-CM) - Chronic bilateral low back pain without sciatica M47.817 (ICD-10-CM) - Spondylosis without myelopathy or radiculopathy, lumbosacral region M47.816 (ICD-10-CM) - Facet arthropathy, lumbar.  Leroy Lara has a history of chronic low back and hip pain, although sciatica has not been as much of an issue recently.  He has limited lumbar extension AROM, limited bilateral lower extremity flexibility, poor awareness of posture and biomechanics and limited lumbar strength.  He was started on an appropriate physical therapy intervention program today to include posture and body mechanics training, low back strengthening, flexibility and AROM work to meet the below listed goals.  OBJECTIVE IMPAIRMENTS: decreased activity tolerance, decreased endurance, decreased knowledge of condition, decreased ROM, decreased strength, decreased safety awareness, impaired perceived functional ability, impaired flexibility, improper body  mechanics, postural dysfunction, and pain.   ACTIVITY LIMITATIONS: carrying, lifting, bending, sitting, sleeping, bed mobility, and dressing  PARTICIPATION LIMITATIONS: driving, community activity, and occupation  PERSONAL FACTORS: A-fib, Aortic aneurysm, Lt CTS, chronic hip and back pain are also affecting patient's functional outcome.   REHAB POTENTIAL: Good  CLINICAL DECISION MAKING: Stable/uncomplicated  EVALUATION COMPLEXITY: Low   GOALS: Goals reviewed with patient? Yes  SHORT TERM GOALS: Target date: 07/08/2024  Leroy Lara will be independent with his day 1 home exercise program Baseline: Started 06/10/2024 Goal status: Met 06/26/2024  2.  Improve lumbar extension AROM to 10 degrees Baseline: 5 degrees Goal status: INITIAL  3.  Improve bilateral lower extremity flexibility for hip flexors to 100 degrees; hamstrings to 45 degrees and hip external rotation to 30 degrees Baseline: 90/95; 40/35 and 21/23 respectively Goal status: Met 06/26/2024   LONG TERM GOALS: Target date: 08/05/2024  Improve patient specific functional score to at least 6 Baseline: 3.67 Goal status: INITIAL  2.  Leroy Lara will report low back and bilateral hip pain consistently 0-3/10 on the numeric pain rating scale Baseline: 2-6/10 Goal status: INITIAL  3.  Improve bilateral hip external rotation active range of motion 40 degrees Baseline: 21/23 degrees Goal status: INITIAL  4.  Improve spine strength as assessed by 30 seconds or more on the prone spine strength test position Baseline: 5 seconds Goal status: INITIAL  5.  Leroy Lara will be independent with his long-term maintenance home exercise program at discharge Baseline: Started 06/10/2024 Goal status: INITIAL  PLAN:  PT FREQUENCY: 1-2x/week  PT DURATION: 8 weeks  PLANNED INTERVENTIONS: 97110-Therapeutic exercises, 97530- Therapeutic activity, 97112- Neuromuscular re-education, 97535- Self Care, 02859- Manual therapy, G0283- Electrical stimulation  (unattended), 97012- Traction (mechanical), 20560 (1-2 muscles), 20561 (3+ muscles)- Dry Needling, Patient/Family education, Joint mobilization, Spinal mobilization, Cryotherapy, and Moist heat.  PLAN FOR NEXT SESSION: Review home exercise program.  Postural and body mechanics education along with progressing low back strength, remaining active range of motion and flexibility impairments.   Leroy Lara, PT, MPT 06/26/2024, 3:28 PM

## 2024-07-01 ENCOUNTER — Telehealth: Payer: Self-pay | Admitting: Rehabilitative and Restorative Service Providers"

## 2024-07-01 NOTE — Telephone Encounter (Signed)
 Doing well.  Cancelled Thursday do to excellent progress.  Will see tomorrow.

## 2024-07-02 ENCOUNTER — Ambulatory Visit (INDEPENDENT_AMBULATORY_CARE_PROVIDER_SITE_OTHER): Admitting: Rehabilitative and Restorative Service Providers"

## 2024-07-02 ENCOUNTER — Encounter: Payer: Self-pay | Admitting: Rehabilitative and Restorative Service Providers"

## 2024-07-02 DIAGNOSIS — R293 Abnormal posture: Secondary | ICD-10-CM | POA: Diagnosis not present

## 2024-07-02 DIAGNOSIS — M6281 Muscle weakness (generalized): Secondary | ICD-10-CM | POA: Diagnosis not present

## 2024-07-02 DIAGNOSIS — M5459 Other low back pain: Secondary | ICD-10-CM

## 2024-07-02 NOTE — Therapy (Signed)
 OUTPATIENT PHYSICAL THERAPY THORACOLUMBAR TREATMENT   Patient Name: Leroy Lara MRN: [id number] DOB:1946-08-23, 78 y.o., male Today's Date: 07/02/2024  END OF SESSION:  PT End of Session - 07/02/24 1107     Visit Number 4    Number of Visits 16    Date for PT Re-Evaluation 08/05/24    Progress Note Due on Visit 10    PT Start Time 1106    PT Stop Time 1148    PT Time Calculation (min) 42 min    Activity Tolerance Patient tolerated treatment well;No increased pain;Patient limited by pain    Behavior During Therapy [facility] for tasks assessed/performed             Past Medical History:  Diagnosis Date   AAA (abdominal aortic aneurysm) (HCC)    Atrial fibrillation, permanent (HCC)    Carotid artery calcification, bilateral    Essential hypertension    Gastroesophageal reflux disease    Vocal cord mass    Past Surgical History:  Procedure Laterality Date   MICROLARYNGOSCOPY WITH CO2 LASER AND EXCISION OF VOCAL CORD LESION N/A 11/12/2022   Procedure: MICROLARYNGOSCOPY WITH EXCISION OF VOCAL CORD LESION;  Surgeon: Jesus Oliphant, MD;  Location: [facility];  Service: ENT;  Laterality: N/A;   Patient Active Problem List   Diagnosis Date Noted   Carpal tunnel syndrome of left wrist 06/16/2018   Right hip pain 01/29/2017   Left hip pain 01/29/2017   Abnormal EKG 01/28/2012   Hypertension, benign 01/28/2012   Right carotid bruit 01/28/2012    PCP: Josette LELON Aho, PA-C  REFERRING PROVIDER: Duwaine FORBES Pouch, NP  REFERRING DIAG: M54.50,G89.29 (ICD-10-CM) - Chronic bilateral low back pain without sciatica M47.817 (ICD-10-CM) - Spondylosis without myelopathy or radiculopathy, lumbosacral region M47.816 (ICD-10-CM) - Facet arthropathy, lumbar  Rationale for Evaluation and Treatment: Rehabilitation  THERAPY DIAG:  Abnormal posture  Other low back pain  Muscle weakness (generalized)  ONSET DATE: Chronic  SUBJECTIVE:                                                                                                                                                                                            SUBJECTIVE STATEMENT: [name] reports continued careful attention to posture and body mechanics.  Pain has been less severe unless he overdoes things.  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  Symptoms are worse with prolonged sitting.  He is doing a current rehabilitation routine for his back and shoulder.  Bending is bad for his spine.    PERTINENT HISTORY:  A-fib, Aortic aneurysm, Lt CTS, chronic hip and back  pain  PAIN:  Are you having pain? Yes: NPRS scale: Low back 0-2/10; Right 0/10 this week Pain location: Low back and hips Pain description: Achy, sore, stiff, can be painful Aggravating factors: Prolonged sitting, poor posture, bending and twisting Relieving factors: Movement  PRECAUTIONS: Back  RED FLAGS: None   WEIGHT BEARING RESTRICTIONS: No  FALLS:  Has patient fallen in last 6 months? No  LIVING ENVIRONMENT: Lives with: lives with their family and lives with their spouse Lives in: House/apartment Stairs: No problems Has following equipment at home: None  OCCUPATION: Working semi-retired  PLOF: Independent  PATIENT GOALS: Be able to function better with less pain and stiffness  NEXT MD VISIT: 07/22/2024  OBJECTIVE:  Note: Objective measures were completed at Evaluation unless otherwise noted.  DIAGNOSTIC FINDINGS:  IMPRESSION: 1.  No evidence of fracture or subluxation.   2. 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  PATIENT SURVEYS:  Patient specific functional scale (0 unable to 10 no difficulty) 1) Prolonged sitting  4/10  10/10 2) Bending   3/10  10/10 3) Doing dishes 4/10  10/10  AVG   3.67  10  COGNITION: Overall cognitive status: Within functional limits for tasks assessed     SENSATION: [facility]  MUSCLE LENGTH: 07/02/2024: Hamstrings: Right 50  deg; Left 45 deg  06/26/2024: Hamstrings: Right 50 deg; Left 50 deg  Eval: Hamstrings: Right 35 deg; Left 40 deg  POSTURE: rounded shoulders, forward head, and decreased lumbar lordosis  LUMBAR ROM:   AROM 06/10/2024 07/02/2024  Flexion    Extension 5 10  Right lateral flexion    Left lateral flexion    Right rotation    Left rotation     (Blank rows = not tested)  LOWER EXTREMITY ROM:     Passive  Left/Right 06/10/2024 Left/Right 06/26/2024 Left/Right 07/02/2024  Hip flexion 90/95 110/110 105/110  Hip extension     Hip abduction     Hip adduction     Hip internal rotation 7/12 10/16 7/16  Hip external rotation 21/23 34/32 27/27   Knee flexion     Knee extension     Ankle dorsiflexion     Ankle plantarflexion     Ankle inversion     Ankle eversion      (Blank rows = not tested)  STRENGTH:    MMT 06/10/2024 07/02/2024  Hip flexion    Hip extension    Hip abduction    Spine Strength (Prone Superman) 5 seconds (Goal is 30-60 seconds) 9 seconds  Hip adduction    Hip internal rotation    Hip external rotation    Knee flexion    Knee extension    Ankle dorsiflexion    Ankle plantarflexion    Ankle inversion    Ankle eversion     (Blank rows = not tested)  GAIT: Distance walked: 100 feet Assistive device utilized: None Level of assistance: Complete Independence Comments: No issues with walking, better with movement  TREATMENT DATE:  07/02/2024 Standing lumbar extension (hips forward) 10 x 3 seconds             Scapular retraction/shoulder blade pinches (SBP) 10 x 5 seconds        Supine hamstrings stretch with strap 4 x 20 seconds Supine figure 4 stretch with push into external rotation 4 x 20 seconds Cervical rotation AROM with SBP 10 x 5 seconds Prone alternating hip extension 2 sets of 10 for 7 seconds  Hip hike in doorframe 10 x 3 seconds bilaterally Hip hike with push 5 x 3 seconds bilateral  02464: Verbally reviewed posture and body mechanics basics  including golfers and diagonal squat lift; body mechanics with dishes and brushing teeth; the importance of avoiding flexed and slouched postures; review of updates/progressions to home exercise program   06/26/2024 Standing lumbar extension (hips forward) 10 x 3 seconds             Scapular retraction/shoulder blade pinches (SBP) 10 x 5 seconds        Supine hamstrings stretch with strap 4 x 20 seconds Supine figure 4 stretch with push into external rotation 4 x 20 seconds Cervical rotation AROM with SBP 10 x 5 seconds Prone alternating hip extension 2 sets of 10 for 5 seconds Hip hike in doorframe 10 x 3 seconds bilaterally  97535: Briefly reviewed posture and body mechanics basics including golfers and diagonal squat lift; body mechanics with dishes and brushing teeth; the importance of avoiding flexed and slouched postures; review of changes to home exercise program   06/17/2024 Standing lumbar extension (hips forward) 10 x 3 seconds             Scapular retraction/shoulder blade pinches (SBP) 10 x 5 seconds        Supine and standing hamstrings stretch 4 x 20 seconds each Supine figure 4 stretch with push into external rotation 4 x 20 seconds Cervical rotation AROM with SBP 10 x 5 seconds Prone alternating hip extension 2 sets of 10 for 3 seconds  97535: Reviewed posture and body mechanics basics including golfers and diagonal squat lift; body mechanics with dishes and brushing teeth; the importance of avoiding flexed and slouched postures; review of changes to home exercise program    PATIENT EDUCATION:  Education details: See above Person educated: Patient Education method: Explanation, Demonstration, Tactile cues, Verbal cues, and Handouts Education comprehension: verbalized understanding, returned demonstration, verbal cues required, tactile cues required, and needs further education  HOME EXERCISE PROGRAM: Access Code: [email or web address] URL:  [email or web address] Date: 06/26/2024 Prepared by: Lamar Ivory  Exercises - Standing Lumbar Extension at Wall - Forearms  - 5 x daily - 7 x weekly - 1 sets - 5 reps - 3 seconds hold - Standing Scapular Retraction  - 5 x daily - 7 x weekly - 1 sets - 5 reps - 5 second hold - Supine Figure 4 Piriformis Stretch  - 2 x daily - 7 x weekly - 1 sets - 5 reps - 20 seconds hold - Prone Hip Extension  - 1 x daily - 7 x weekly - 2 sets - 10 reps - 3-5 seconds hold - Seated Cervical Rotation AROM  - 2-3 x daily - 7 x weekly - 1 sets - 10 reps - 5 seconds hold - Supine Hamstring Stretch with Strap  - 2 x daily - 7 x weekly - 1 sets - 5 reps - 20 seconds hold - Standing Hip Hiking  - 3 x daily - 7 x weekly - 1 sets - 10 reps - 5 seconds hold  ASSESSMENT:  CLINICAL IMPRESSION: [name] continues to demonstrate good compliance with his home exercises, posture and body mechanics.  We again made progressions to his core strengthening today which should help him meet long-term goals within the anticipated plan of care.  If [name] is comfortable with today's progressions at his next scheduled visit, he may be ready for DC into independent rehabilitation.  Patient is a 78 y.o.  male who was seen today for physical therapy evaluation and treatment for M54.50,G89.29 (ICD-10-CM) - Chronic bilateral low back pain without sciatica M47.817 (ICD-10-CM) - Spondylosis without myelopathy or radiculopathy, lumbosacral region M47.816 (ICD-10-CM) - Facet arthropathy, lumbar.  [name] has a history of chronic low back and hip pain, although sciatica has not been as much of an issue recently.  He has limited lumbar extension AROM, limited bilateral lower extremity flexibility, poor awareness of posture and biomechanics and limited lumbar strength.  He was started on an appropriate physical therapy intervention program today to include posture and body mechanics training, low back strengthening, flexibility and AROM work to meet  the below listed goals.  OBJECTIVE IMPAIRMENTS: decreased activity tolerance, decreased endurance, decreased knowledge of condition, decreased ROM, decreased strength, decreased safety awareness, impaired perceived functional ability, impaired flexibility, improper body mechanics, postural dysfunction, and pain.   ACTIVITY LIMITATIONS: carrying, lifting, bending, sitting, sleeping, bed mobility, and dressing  PARTICIPATION LIMITATIONS: driving, community activity, and occupation  PERSONAL FACTORS: A-fib, Aortic aneurysm, Lt CTS, chronic hip and back pain are also affecting patient's functional outcome.   REHAB POTENTIAL: Good  CLINICAL DECISION MAKING: Stable/uncomplicated  EVALUATION COMPLEXITY: Low   GOALS: Goals reviewed with patient? Yes  SHORT TERM GOALS: Target date: 07/08/2024  [name] will be independent with his day 1 home exercise program Baseline: Started 06/10/2024 Goal status: Met 06/26/2024  2.  Improve lumbar extension AROM to 10 degrees Baseline: 5 degrees Goal status: Met 07/02/2024  3.  Improve bilateral lower extremity flexibility for hip flexors to 100 degrees; hamstrings to 45 degrees and hip external rotation to 30 degrees Baseline: 90/95; 40/35 and 21/23 respectively Goal status: Met 06/26/2024   LONG TERM GOALS: Target date: 08/05/2024  Improve patient specific functional score to at least 6 Baseline: 3.67 Goal status: Met 07/02/2024  2.  [name] will report low back and bilateral hip pain consistently 0-3/10 on the numeric pain rating scale Baseline: 2-6/10 Goal status: Met 07/02/2024  3.  Improve bilateral hip external rotation active range of motion 40 degrees Baseline: 21/23 degrees Goal status: On Going 07/02/2024  4.  Improve spine strength as assessed by 30 seconds or more on the prone spine strength test position Baseline: 5 seconds Goal status: On Going 07/02/2024  5.  [name] will be independent with his long-term maintenance home exercise program at  discharge Baseline: Started 06/10/2024 Goal status: On Going 07/02/2024  PLAN:  PT FREQUENCY: 1-2x/week  PT DURATION: 8 weeks  PLANNED INTERVENTIONS: 97110-Therapeutic exercises, 97530- Therapeutic activity, 97112- Neuromuscular re-education, 97535- Self Care, 02859- Manual therapy, G0283- Electrical stimulation (unattended), 97012- Traction (mechanical), 20560 (1-2 muscles), 20561 (3+ muscles)- Dry Needling, Patient/Family education, Joint mobilization, Spinal mobilization, Cryotherapy, and Moist heat.  PLAN FOR NEXT SESSION: Review home exercise program.  Postural and body mechanics education.  DC if appropriate and no unexpected flare-ups since 07/02/2024 visit.   Myer LELON Ivory, PT, MPT 07/02/2024, 1:56 PM

## 2024-07-03 ENCOUNTER — Encounter: Admitting: Rehabilitative and Restorative Service Providers"

## 2024-07-08 ENCOUNTER — Encounter: Payer: Self-pay | Admitting: Rehabilitative and Restorative Service Providers"

## 2024-07-08 ENCOUNTER — Ambulatory Visit (INDEPENDENT_AMBULATORY_CARE_PROVIDER_SITE_OTHER): Admitting: Rehabilitative and Restorative Service Providers"

## 2024-07-08 DIAGNOSIS — R293 Abnormal posture: Secondary | ICD-10-CM | POA: Diagnosis not present

## 2024-07-08 DIAGNOSIS — M5459 Other low back pain: Secondary | ICD-10-CM | POA: Diagnosis not present

## 2024-07-08 DIAGNOSIS — M6281 Muscle weakness (generalized): Secondary | ICD-10-CM

## 2024-07-08 NOTE — Therapy (Signed)
 OUTPATIENT PHYSICAL THERAPY THORACOLUMBAR TREATMENT/DISCHARGE  PHYSICAL THERAPY DISCHARGE SUMMARY  Visits from Start of Care: 6  Current functional level related to goals / functional outcomes: See note   Remaining deficits: See note   Education / Equipment: Updated HEP   Patient agrees to discharge. Patient goals were mostly met. Patient is being discharged due to being pleased with the current functional level.    Patient Name: Leroy Lara MRN: [id number] DOB:1946-06-23, 78 y.o., male Today's Date: 07/08/2024  END OF SESSION:  PT End of Session - 07/08/24 1301     Visit Number 5    Number of Visits 16    Date for PT Re-Evaluation 08/05/24    Progress Note Due on Visit 10    PT Start Time 1300    PT Stop Time 1346    PT Time Calculation (min) 46 min    Activity Tolerance Patient tolerated treatment well;No increased pain;Patient limited by pain    Behavior During Therapy [facility] for tasks assessed/performed              Past Medical History:  Diagnosis Date   AAA (abdominal aortic aneurysm) (HCC)    Atrial fibrillation, permanent (HCC)    Carotid artery calcification, bilateral    Essential hypertension    Gastroesophageal reflux disease    Vocal cord mass    Past Surgical History:  Procedure Laterality Date   MICROLARYNGOSCOPY WITH CO2 LASER AND EXCISION OF VOCAL CORD LESION N/A 11/12/2022   Procedure: MICROLARYNGOSCOPY WITH EXCISION OF VOCAL CORD LESION;  Surgeon: Jesus Oliphant, MD;  Location: [facility];  Service: ENT;  Laterality: N/A;   Patient Active Problem List   Diagnosis Date Noted   Carpal tunnel syndrome of left wrist 06/16/2018   Right hip pain 01/29/2017   Left hip pain 01/29/2017   Abnormal EKG 01/28/2012   Hypertension, benign 01/28/2012   Right carotid bruit 01/28/2012    PCP: Josette LELON Aho, PA-C  REFERRING PROVIDER: Duwaine FORBES Pouch, NP  REFERRING DIAG: M54.50,G89.29 (ICD-10-CM) - Chronic bilateral low back pain  without sciatica M47.817 (ICD-10-CM) - Spondylosis without myelopathy or radiculopathy, lumbosacral region M47.816 (ICD-10-CM) - Facet arthropathy, lumbar  Rationale for Evaluation and Treatment: Rehabilitation  THERAPY DIAG:  Abnormal posture  Other low back pain  Muscle weakness (generalized)  ONSET DATE: Chronic  SUBJECTIVE:                                                                                                                                                                                           SUBJECTIVE STATEMENT: Leroy Lara reports continued careful attention to posture and body mechanics.  Pain has been less severe unless he overdoes things.  He is independent and compliant with his current home exercises which are addressing remaining impairments.  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  Symptoms are worse with prolonged sitting.  He is doing a current rehabilitation routine for his back and shoulder.  Bending is bad for his spine.    PERTINENT HISTORY:  A-fib, Aortic aneurysm, Lt CTS, chronic hip and back pain  PAIN:  Are you having pain? Yes: NPRS scale: Low back 0-2/10; Right 0/10 this week Pain location: Low back and hips Pain description: Achy, sore, stiff, can be painful Aggravating factors: Prolonged sitting, poor posture, bending and twisting Relieving factors: Movement  PRECAUTIONS: Back  RED FLAGS: None   WEIGHT BEARING RESTRICTIONS: No  FALLS:  Has patient fallen in last 6 months? No  LIVING ENVIRONMENT: Lives with: lives with their family and lives with their spouse Lives in: House/apartment Stairs: No problems Has following equipment at home: None  OCCUPATION: Working semi-retired  PLOF: Independent  PATIENT GOALS: Be able to function better with less pain and stiffness  NEXT MD VISIT: 07/22/2024  OBJECTIVE:  Note: Objective measures were completed at Evaluation unless otherwise noted.  DIAGNOSTIC FINDINGS:  IMPRESSION: 1.   No evidence of fracture or subluxation.   2. 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  PATIENT SURVEYS:  Patient specific functional scale (0 unable to 10 no difficulty) 1) Prolonged sitting  4/10  10/10 2) Bending   3/10  10/10 3) Doing dishes 4/10  10/10  AVG   3.67  10  COGNITION: Overall cognitive status: Within functional limits for tasks assessed     SENSATION: [facility]  MUSCLE LENGTH: 07/08/2024: Hamstrings: Right 50 deg; Left 45 deg  07/02/2024: Hamstrings: Right 50 deg; Left 45 deg  06/26/2024: Hamstrings: Right 50 deg; Left 50 deg  Eval: Hamstrings: Right 35 deg; Left 40 deg  POSTURE: rounded shoulders, forward head, and decreased lumbar lordosis  LUMBAR ROM:   AROM 06/10/2024 07/02/2024  Flexion    Extension 5 10  Right lateral flexion    Left lateral flexion    Right rotation    Left rotation     (Blank rows = not tested)  LOWER EXTREMITY ROM:     Passive  Left/Right 06/10/2024 Left/Right 06/26/2024 Left/Right 07/02/2024 Left/Right 07/08/2024  Hip flexion 90/95 110/110 105/110   Hip extension      Hip abduction      Hip adduction      Hip internal rotation 7/12 10/16 7/16   Hip external rotation 21/23 34/32 27/27  29/28  Knee flexion      Knee extension      Ankle dorsiflexion      Ankle plantarflexion      Ankle inversion      Ankle eversion       (Blank rows = not tested)  STRENGTH:    MMT 06/10/2024 07/02/2024 07/08/2024  Hip flexion     Hip extension     Hip abduction     Spine Strength (Prone Superman) 5 seconds (Goal is 30-60 seconds) 9 seconds 11 seconds  Hip adduction     Hip internal rotation     Hip external rotation     Knee flexion     Knee extension     Ankle dorsiflexion     Ankle plantarflexion     Ankle inversion     Ankle eversion      (  Blank rows = not tested)  GAIT: Distance walked: 100 feet Assistive device utilized: None Level of assistance: Complete  Independence Comments: No issues with walking, better with movement  TREATMENT DATE:  07/08/2024 Standing lumbar extension (hips forward) 5 x 3 seconds             Scapular retraction/shoulder blade pinches (SBP) 10 x 5 seconds        Supine hamstrings stretch with strap 4 x 20 seconds Supine figure 4 stretch with push into external rotation 4 x 20 seconds Cervical rotation AROM with SBP 10 x 5 seconds Prone alternating hip extension 2 sets of 10 for 7 seconds Hip hike with push 10 x 3 seconds bilateral  02464: Reviewed posture and body mechanics; review of updates/progressions to home exercise program and the long-term benefits of avoiding overuse for the best long-term possible alcohol   07/02/2024 Standing lumbar extension (hips forward) 10 x 3 seconds             Scapular retraction/shoulder blade pinches (SBP) 10 x 5 seconds        Supine hamstrings stretch with strap 4 x 20 seconds Supine figure 4 stretch with push into external rotation 4 x 20 seconds Cervical rotation AROM with SBP 10 x 5 seconds Prone alternating hip extension 2 sets of 10 for 7 seconds Hip hike in doorframe 10 x 3 seconds bilaterally Hip hike with push 5 x 3 seconds bilateral  02464: Verbally reviewed posture and body mechanics basics including golfers and diagonal squat lift; body mechanics with dishes and brushing teeth; the importance of avoiding flexed and slouched postures; review of updates/progressions to home exercise program   06/26/2024 Standing lumbar extension (hips forward) 10 x 3 seconds             Scapular retraction/shoulder blade pinches (SBP) 10 x 5 seconds        Supine hamstrings stretch with strap 4 x 20 seconds Supine figure 4 stretch with push into external rotation 4 x 20 seconds Cervical rotation AROM with SBP 10 x 5 seconds Prone alternating hip extension 2 sets of 10 for 5 seconds Hip hike in doorframe 10 x 3 seconds bilaterally  97535: Briefly reviewed posture and body  mechanics basics including golfers and diagonal squat lift; body mechanics with dishes and brushing teeth; the importance of avoiding flexed and slouched postures; review of changes to home exercise program   PATIENT EDUCATION:  Education details: See above Person educated: Patient Education method: Explanation, Demonstration, Tactile cues, Verbal cues, and Handouts Education comprehension: verbalized understanding, returned demonstration, verbal cues required, tactile cues required, and needs further education  HOME EXERCISE PROGRAM: Access Code: [email or web address] URL: [email or web address] Date: 06/26/2024 Prepared by: Lamar Ivory  Exercises - Standing Lumbar Extension at Wall - Forearms  - 5 x daily - 7 x weekly - 1 sets - 5 reps - 3 seconds hold - Standing Scapular Retraction  - 5 x daily - 7 x weekly - 1 sets - 5 reps - 5 second hold - Supine Figure 4 Piriformis Stretch  - 2 x daily - 7 x weekly - 1 sets - 5 reps - 20 seconds hold - Prone Hip Extension  - 1 x daily - 7 x weekly - 2 sets - 10 reps - 3-5 seconds hold - Seated Cervical Rotation AROM  - 2-3 x daily - 7 x weekly - 1 sets - 10 reps - 5 seconds hold - Supine Hamstring Stretch with Strap  -  2 x daily - 7 x weekly - 1 sets - 5 reps - 20 seconds hold - Standing Hip Hiking  - 3 x daily - 7 x weekly - 1 sets - 10 reps - 5 seconds hold  ASSESSMENT:  CLINICAL IMPRESSION: Leroy Lara has made excellent progress with his supervised physical therapy.  He has demonstrated independence and compliance with his home exercises which continue to address remaining impairments.  Leroy Lara is doing much better since starting physical therapy unless he overdoes things.  Leroy Lara has been a pleasure to work with and is welcome to return for further rehabilitation or reassessment if requested.  Patient is a 78 y.o. male who was seen today for physical therapy evaluation and treatment for M54.50,G89.29 (ICD-10-CM) - Chronic bilateral low back pain without  sciatica M47.817 (ICD-10-CM) - Spondylosis without myelopathy or radiculopathy, lumbosacral region M47.816 (ICD-10-CM) - Facet arthropathy, lumbar.  Leroy Lara has a history of chronic low back and hip pain, although sciatica has not been as much of an issue recently.  He has limited lumbar extension AROM, limited bilateral lower extremity flexibility, poor awareness of posture and biomechanics and limited lumbar strength.  He was started on an appropriate physical therapy intervention program today to include posture and body mechanics training, low back strengthening, flexibility and AROM work to meet the below listed goals.  OBJECTIVE IMPAIRMENTS: decreased activity tolerance, decreased endurance, decreased knowledge of condition, decreased ROM, decreased strength, decreased safety awareness, impaired perceived functional ability, impaired flexibility, improper body mechanics, postural dysfunction, and pain.   ACTIVITY LIMITATIONS: carrying, lifting, bending, sitting, sleeping, bed mobility, and dressing  PARTICIPATION LIMITATIONS: driving, community activity, and occupation  PERSONAL FACTORS: A-fib, Aortic aneurysm, Lt CTS, chronic hip and back pain are also affecting patient's functional outcome.   REHAB POTENTIAL: Good  CLINICAL DECISION MAKING: Stable/uncomplicated  EVALUATION COMPLEXITY: Low   GOALS: Goals reviewed with patient? Yes  SHORT TERM GOALS: Target date: 07/08/2024  Leroy Lara will be independent with his day 1 home exercise program Baseline: Started 06/10/2024 Goal status: Met 06/26/2024  2.  Improve lumbar extension AROM to 10 degrees Baseline: 5 degrees Goal status: Met 07/02/2024  3.  Improve bilateral lower extremity flexibility for hip flexors to 100 degrees; hamstrings to 45 degrees and hip external rotation to 30 degrees Baseline: 90/95; 40/35 and 21/23 respectively Goal status: Met 06/26/2024   LONG TERM GOALS: Target date: 08/05/2024  Improve patient specific functional  score to at least 6 Baseline: 3.67 Goal status: Met 07/02/2024  2.  Leroy Lara will report low back and bilateral hip pain consistently 0-3/10 on the numeric pain rating scale Baseline: 2-6/10 Goal status: Met 07/02/2024  3.  Improve bilateral hip external rotation active range of motion 40 degrees Baseline: 21/23 degrees Goal status: On Going 07/08/2024  4.  Improve spine strength as assessed by 30 seconds or more on the prone spine strength test position Baseline: 5 seconds Goal status: On Going 07/08/2024  5.  Leroy Lara will be independent with his long-term maintenance home exercise program at discharge Baseline: Started 06/10/2024 Goal status: MET 07/08/2024  PLAN:  PT FREQUENCY: DC  PT DURATION: DC  PLANNED INTERVENTIONS: 97110-Therapeutic exercises, 97530- Therapeutic activity, 97112- Neuromuscular re-education, 97535- Self Care, 02859- Manual therapy, G0283- Electrical stimulation (unattended), [phone]- Traction (mechanical), 20560 (1-2 muscles), 20561 (3+ muscles)- Dry Needling, Patient/Family education, Joint mobilization, Spinal mobilization, Cryotherapy, and Moist heat.  PLAN FOR NEXT SESSION: DC   Myer LELON Ivory, PT, MPT 07/08/2024, 4:43 PM

## 2024-07-10 ENCOUNTER — Encounter: Admitting: Rehabilitative and Restorative Service Providers"

## 2024-07-15 ENCOUNTER — Encounter: Admitting: Rehabilitative and Restorative Service Providers"

## 2024-07-22 ENCOUNTER — Ambulatory Visit: Admitting: Physical Medicine and Rehabilitation

## 2024-07-23 ENCOUNTER — Encounter: Admitting: Rehabilitative and Restorative Service Providers"

## 2024-08-05 ENCOUNTER — Telehealth: Payer: Self-pay

## 2024-08-05 NOTE — Telephone Encounter (Signed)
   Pre-operative Risk Assessment    Patient Name: Leroy Lara  DOB: [birth year] MRN: [id number]   Date of last office visit: 10/18/23 OZELL FELL, MD Date of next office visit: 10/20/24 OZELL FELL, MD   Request for Surgical Clearance    Procedure:  RIGHT CARPAL TUNNEL RELEASE  Date of Surgery:  Clearance TBD                                Surgeon:  OZELL HOCK, MD Surgeon's Group or Practice Name:  [facility] Phone number:  [phone] Fax number:  [phone]   Type of Clearance Requested:   - Medical  - Pharmacy:  Hold Apixaban  (Eliquis )     Type of Anesthesia:  Not Indicated   Additional requests/questions:    SignedLucie DELENA Ku   08/05/2024, 5:57 PM

## 2024-08-05 NOTE — Telephone Encounter (Signed)
 Pharmacy please advise on holding Eliquis  prior to right carpal tunnel release scheduled for TBD. Thank you.

## 2024-08-06 ENCOUNTER — Encounter: Admitting: Rehabilitative and Restorative Service Providers"

## 2024-08-06 NOTE — Telephone Encounter (Signed)
 Patient with diagnosis of atrial fibrillation on Eliquis  for anticoagulation.    Procedure:  RIGHT CARPAL TUNNEL RELEASE   Date of Surgery:  Clearance TBD      CHA2DS2-VASc Score = 4   This indicates a 4.8% annual risk of stroke. The patient's score is based upon: CHF History: 0 HTN History: 1 Diabetes History: 0 Stroke History: 0 Vascular Disease History: 1 Age Score: 2 Gender Score: 0    CrCl 64 Platelet count 186  Patient has not had an Afib/aflutter ablation within the last 3 months or DCCV within the last 30 days   Per office protocol, patient can hold Eliquis  for 2 days prior to procedure.   Patient will not need bridging with Lovenox (enoxaparin) around procedure.  **This guidance is not considered finalized until pre-operative APP has relayed final recommendations.**

## 2024-08-06 NOTE — Telephone Encounter (Signed)
   Name: Leroy Lara  DOB: 1946-07-21  MRN: [id number]  Primary Cardiologist: Ozell Fell, MD  Chart reviewed as part of pre-operative protocol coverage. The patient has an upcoming visit scheduled with Dr. Fell on 06/19/2024 at which time clearance can be addressed in case there are any issues that would impact surgical recommendations. I added preop FYI to appointment note so that provider is aware to address at time of outpatient visit.  Per office protocol the cardiology provider should forward their finalized clearance decision and recommendations regarding antiplatelet therapy to the requesting party below.    Per office protocol, patient can hold Eliquis  for 2 days prior to procedure.   Patient will not need bridging with Lovenox (enoxaparin) around procedure.   I will route this message as FYI to requesting party and remove this message from the preop box as separate preop APP input not needed at this time.   Please call with any questions.  Wyn Raddle, Jackee Shove, NP  08/06/2024, 12:47 PM

## 2024-08-13 ENCOUNTER — Encounter: Admitting: Rehabilitative and Restorative Service Providers"

## 2024-10-12 ENCOUNTER — Encounter: Payer: Self-pay | Admitting: Radiology

## 2024-10-20 ENCOUNTER — Ambulatory Visit: Attending: Cardiovascular Disease | Admitting: Cardiovascular Disease

## 2024-10-20 ENCOUNTER — Encounter: Payer: Self-pay | Admitting: Cardiovascular Disease

## 2024-10-20 ENCOUNTER — Ambulatory Visit

## 2024-10-20 VITALS — BP 138/72 | HR 40 | Ht 72.0 in | Wt 182.6 lb

## 2024-10-20 DIAGNOSIS — I4811 Longstanding persistent atrial fibrillation: Secondary | ICD-10-CM

## 2024-10-20 DIAGNOSIS — I10 Essential (primary) hypertension: Secondary | ICD-10-CM | POA: Diagnosis not present

## 2024-10-20 DIAGNOSIS — I6523 Occlusion and stenosis of bilateral carotid arteries: Secondary | ICD-10-CM | POA: Insufficient documentation

## 2024-10-20 DIAGNOSIS — E782 Mixed hyperlipidemia: Secondary | ICD-10-CM | POA: Insufficient documentation

## 2024-10-20 DIAGNOSIS — R42 Dizziness and giddiness: Secondary | ICD-10-CM | POA: Diagnosis present

## 2024-10-20 NOTE — Progress Notes (Unsigned)
 Enrolled patient for a 3 day Zio XT monitor to be mailed to patients home

## 2024-10-20 NOTE — Progress Notes (Signed)
 Cardiology Office Note:    Date:  10/20/2024   ID:  Leroy Lara, DOB 19-May-1946, MRN [id number]  PCP:  Leroy Josette MOHR Lara   [facility] Providers Cardiologist:  Leroy Fell, MD     Referring MD: Leroy Josette ORN., Lara   Chief Complaint  Patient presents with   Atrial Fibrillation    History of Present Illness:    [name] is a 78 y.o. male with a hx of permanent atrial fibrillation with slow ventricular response, hypertension, abdominal aortic aneurysm, and nonobstructive carotid plaquing.  The patient presents today for follow-up evaluation.  He has been anticoagulated with apixaban  without bleeding problems.  He has been followed closely and has not had symptoms consistent with symptomatic bradycardia.  He is seen EP in consultation and decision was made to continue with watchful waiting.  The patient is here alone today.  He complains of worsening generalized fatigue.  He has developed some exercise intolerance.  He has occasional dizziness, especially when moving certain ways.  He denies chest pain or chest pressure with physical activity.  He denies orthopnea, PND, heart palpitations, or leg edema.  He has mild exertional dyspnea.  He is now followed by pulmonary and sees Dr. Shellia at [facility] for mild emphysema.   Current Medications: Current Meds  Medication Sig   apixaban  (ELIQUIS ) 5 MG TABS tablet TAKE 1 TABLET TWICE A DAY   budesonide (PULMICORT) 0.5 MG/2ML nebulizer solution Take 0.5 mg by nebulization 2 (two) times daily.   clobetasol ointment (TEMOVATE) 0.05 %    hydrochlorothiazide  (HYDRODIURIL ) 12.5 MG tablet TAKE 1 TABLET DAILY   Lifitegrast (XIIDRA) 5 % SOLN Apply to eye as needed.   losartan  (COZAAR ) 100 MG tablet TAKE 1 TABLET DAILY   Multiple Vitamin (MULTI-VITAMIN) tablet Take by mouth daily.   omeprazole (PRILOSEC) 20 MG capsule Take 20 mg by mouth daily.   OXISTAT 1 % lotion as needed.   predniSONE  (DELTASONE ) 50 MG tablet Take  one tablet by mouth once daily for 5 days.   rosuvastatin  (CRESTOR ) 20 MG tablet TAKE 1 TABLET DAILY     Allergies:   Sulfamethoxazole, Sulfasalazine, and Sulfonamide derivatives   ROS:   Please see the history of present illness.    Positive for weight loss. All other systems reviewed and are negative.  EKGs/Labs/Other Studies Reviewed:    The following studies were reviewed today: Cardiac Studies & Procedures   ______________________________________________________________________________________________   STRESS TESTS  EXERCISE TOLERANCE TEST (ETT) 06/19/2018  Interpretation Summary  Blood pressure demonstrated a hypertensive response to exercise.  Baseline EKG showed atrial fibrillation with LBBB.  Nondiagnostic EKG for ischemia due to underlying LBBB.  Normal chronotropic response to exercise with adequate heart rate response reaching 90% of MPHR.  Mild to moderately impaired exercise capacity at 5.6 mets.   ECHOCARDIOGRAM  ECHOCARDIOGRAM COMPLETE 03/26/2023  Narrative ECHOCARDIOGRAM REPORT    Patient Name:   Leroy Lara [facility] Date of Exam: 03/26/2023 Medical Rec #:  [id number]      Height:       72.0 in Accession #:    [id number]     Weight:       189.0 lb Date of Birth:  Mar 30, 1946      BSA:          2.080 m Patient Age:    76 years       BP:           125/65 mmHg Patient Gender: M  HR:           55 bpm. Exam Location:  [address]  Procedure: 2D Echo, Cardiac Doppler and Color Doppler  Indications:    I48.19 Atrial Fibrillations  History:        Patient has prior history of Echocardiogram examinations, most recent 04/05/2022. CAD, AAA; Risk Factors:Hypertension and HLD.  Sonographer:    Leroy Lara RCS Referring Phys: [phone] Hersey Maclellan  IMPRESSIONS   1. Left ventricular ejection fraction, by estimation, is 55%. The left ventricle has normal function. The left ventricle has no regional wall motion abnormalities. There is mild left  ventricular hypertrophy. Left ventricular diastolic parameters are indeterminate. 2. Right ventricular systolic function is normal. The right ventricular size is normal. There is normal pulmonary artery systolic pressure. The estimated right ventricular systolic pressure is 25.5 mmHg. 3. Left atrial size was severely dilated. 4. Right atrial size was moderately dilated. 5. The mitral valve is grossly normal. Mild mitral valve regurgitation. No evidence of mitral stenosis. 6. The aortic valve is tricuspid. Aortic valve regurgitation is trivial. No aortic stenosis is present. 7. The inferior vena cava is normal in size with greater than 50% respiratory variability, suggesting right atrial pressure of 3 mmHg.  FINDINGS Left Ventricle: Left ventricular ejection fraction, by estimation, is 55%. The left ventricle has normal function. The left ventricle has no regional wall motion abnormalities. The left ventricular internal cavity size was normal in size. There is mild left ventricular hypertrophy. Left ventricular diastolic parameters are indeterminate.  Right Ventricle: The right ventricular size is normal. No increase in right ventricular wall thickness. Right ventricular systolic function is normal. There is normal pulmonary artery systolic pressure. The tricuspid regurgitant velocity is 2.37 m/s, and with an assumed right atrial pressure of 3 mmHg, the estimated right ventricular systolic pressure is 25.5 mmHg.  Left Atrium: Left atrial size was severely dilated.  Right Atrium: Right atrial size was moderately dilated.  Pericardium: There is no evidence of pericardial effusion. Presence of epicardial fat layer.  Mitral Valve: The mitral valve is grossly normal. Mild mitral valve regurgitation. No evidence of mitral valve stenosis.  Tricuspid Valve: The tricuspid valve is normal in structure. Tricuspid valve regurgitation is mild . No evidence of tricuspid stenosis.  Aortic Valve: The aortic  valve is tricuspid. Aortic valve regurgitation is trivial. No aortic stenosis is present.  Pulmonic Valve: The pulmonic valve was normal in structure. Pulmonic valve regurgitation is trivial. No evidence of pulmonic stenosis.  Aorta: The aortic root is normal in size and structure.  Venous: The inferior vena cava is normal in size with greater than 50% respiratory variability, suggesting right atrial pressure of 3 mmHg.  IAS/Shunts: The atrial septum is grossly normal.   LEFT VENTRICLE PLAX 2D LVIDd:         5.60 cm   Diastology LVIDs:         3.80 cm   LV e' medial:    7.29 cm/s LV PW:         1.10 cm   LV E/e' medial:  16.0 LV IVS:        1.10 cm   LV e' lateral:   15.70 cm/s LVOT diam:     2.20 cm   LV E/e' lateral: 7.4 LV SV:         95 LV SV Index:   46 LVOT Area:     3.80 cm   RIGHT VENTRICLE RV Basal diam:  3.70 cm RV S prime:  9.57 cm/s TAPSE (M-mode): 2.5 cm RVSP:           25.5 mmHg  LEFT ATRIUM              Index        RIGHT ATRIUM           Index LA diam:        4.90 cm  2.36 cm/m   RA Pressure: 3.00 mmHg LA Vol (A2C):   75.4 ml  36.25 ml/m  RA Area:     20.90 cm LA Vol (A4C):   95.2 ml  45.77 ml/m  RA Volume:   62.50 ml  30.05 ml/m LA Biplane Vol: 101.0 ml 48.56 ml/m AORTIC VALVE LVOT Vmax:   112.33 cm/s LVOT Vmean:  74.067 cm/s LVOT VTI:    0.251 m  AORTA Ao Root diam: 3.70 cm Ao Asc diam:  3.50 cm  MITRAL VALVE                TRICUSPID VALVE MV Area (PHT):              TR Peak grad:   22.5 mmHg MV Decel Time:              TR Vmax:        237.00 cm/s MV E velocity: 116.50 cm/s  Estimated RAP:  3.00 mmHg RVSP:           25.5 mmHg  SHUNTS Systemic VTI:  0.25 m Systemic Diam: 2.20 cm  Soyla Merck MD Electronically signed by Soyla Merck MD Signature Date/Time: 03/26/2023/11:41:16 AM    Final    MONITORS  LONG TERM MONITOR (3-14 DAYS) 11/12/2022  Narrative Patch Wear Time:  2 days and 22 hours (2023-11-16T09:24:14-0500 to  2023-11-19T07:48:30-0500)  Atrial Fibrillation occurred continuously (100% burden), ranging from 32-95 bpm (avg of 49 bpm). Isolated VEs were occasional (3.6%, 7471), VE Couplets were rare (<1.0%, 61), and VE Triplets were rare (<1.0%, 3). Ventricular Bigeminy and Trigeminy were present. MD notification criteria for Slow Atrial Fibrillation & Symptomatic Bradycardia met - report posted prior to notification per account request (SE).  SUMMARY: monitor shows slow atrial fibrillation with average HR 49 bpm. At the slowest heart rates with ventricular rates in the 30's, the rhythm appears to be a competing junctional pacemaker. Occasional PVC's noted. No tachyarrhythmias. Recommend EP evaluation.   CT SCANS  CT CORONARY FRACTIONAL FLOW RESERVE DATA PREP 09/24/2019  Narrative CLINICAL DATA:  Chest pain  EXAM: CT FFR  MEDICATIONS: No additional meds.  TECHNIQUE: The CTA was sent for FFR.  FINDINGS: No modeled stenoses > 30%.  IMPRESSION: No evidence for hemodynamically significant coronary disease.  Dalton Mclean   Electronically Signed By: Ezra Shuck M.D. On: 09/25/2019 17:26   CT SCANS  CT CORONARY MORPH W/CTA COR W/SCORE 09/23/2019  Addendum 09/23/2019  5:00 PM ADDENDUM REPORT: 09/23/2019 13:35  CLINICAL DATA:  Chest pain  EXAM: Cardiac CTA  MEDICATIONS: Sub lingual nitro. 4mg  x 2  TECHNIQUE: The patient was scanned on a Siemens 192 slice scanner. Gantry rotation speed was 250 msecs. Collimation was 0.6 mm. A 100 kV prospective scan was triggered in the ascending thoracic aorta at 35-75% of the R-R interval. Average HR during the scan was 60 bpm. The 3D data set was interpreted on a dedicated work station using MPR, MIP and VRT modes. A total of 80cc of contrast was used.  FINDINGS: Non-cardiac: See separate report from [facility].  Pulmonary veins drain normally  to the left atrium.  Calcium  Score: 546 Agatston units.  Coronary Arteries:  Right dominant with no anomalies  LM: Mixed plaque ostial left main, minimal stenosis.  LAD system: Calcified plaque proximal LAD, mild (<50%) stenosis. Mixed plaque mid LAD, possible moderate (51-69%) stenosis.  Circumflex system: Mixed plaque proximal LCx, mild (<50%) stenosis. Misregistration artifact noted.  RCA system: Mixed plaque proximal and mid RCA, no more than mild (<50%) stenosis.  IMPRESSION: 1. Coronary artery calcium  score 546 Agatston units. This places the patient in the 70th percentile for age and gender, suggesting intermediate risk for future cardiac events.  2.  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  3.  Possible moderate mid LAD stenosis.  Will send for FFR.  Dalton Mclean   Electronically Signed By: Ezra Shuck M.D. On: 09/23/2019 13:35  Narrative EXAM: OVER-READ INTERPRETATION  CT CHEST  The following report is an over-read performed by radiologist Dr. Toribio Aye of [facility] on 09/23/2019. This over-read does not include interpretation of cardiac or coronary anatomy or pathology. The coronary calcium  score/coronary CTA interpretation by the cardiologist is attached.  COMPARISON:  None.  FINDINGS: Aortic atherosclerosis. Several small pulmonary nodules are noted throughout the visualize lungs, largest of which is in the posterior aspect of the left lower lobe (axial image 22 of series 12) measuring 1.0 x 0.6 cm (mean diameter of 0.8 cm). Within the visualized portions of the thorax there are no other larger more suspicious appearing pulmonary nodules or masses, there is no acute consolidative airspace disease, no pleural effusions, no pneumothorax and no lymphadenopathy. Visualized portions of the upper abdomen are unremarkable. There are no aggressive appearing lytic or blastic lesions noted in the visualized portions of the skeleton.  IMPRESSION: 1. Multiple small pulmonary nodules in the visualize lungs measuring 8  mm or less in size. Non-contrast chest CT at 3-6 months is recommended. If the nodules are stable at time of repeat CT, then future CT at 18-24 months (from today's scan) is considered optional for low-risk patients, but is recommended for high-risk patients. This recommendation follows the consensus statement: Guidelines for Management of Incidental Pulmonary Nodules Detected on CT Images: From the Fleischner Society 2017; Radiology 2017; 284:228-243. 2.  Aortic Atherosclerosis (ICD10-I70.0).  Electronically Signed: By: Toribio Aye M.D. On: 09/23/2019 08:31     ______________________________________________________________________________________________      EKG:   EKG Interpretation Date/Time:  Tuesday October 20 2024 14:27:33 EST Ventricular Rate:  40 PR Interval:    QRS Duration:  156 QT Interval:  512 QTC Calculation: 417 R Axis:   -7  Text Interpretation: Atrial fibrillation Left bundle branch block No previous ECGs available Confirmed by Wonda Sharper [phone]) on 10/20/2024 2:40:58 PM    Recent Labs: No results found for requested labs within last 365 days.  Recent Lipid Panel    Component Value Date/Time   CHOL 125 02/20/2021 0824   TRIG 84 02/20/2021 0824   HDL 45 02/20/2021 0824   CHOLHDL 2.8 02/20/2021 0824   CHOLHDL 3.0 01/10/2016 0759   VLDL 19 01/10/2016 0759   LDLCALC 64 02/20/2021 0824   LDLDIRECT 154.8 08/26/2013 0739     Risk Assessment/Calculations:    CHA2DS2-VASc Score = 4   This indicates a 4.8% annual risk of stroke. The patient's score is based upon: CHF History: 0 HTN History: 1 Diabetes History: 0 Stroke History: 0 Vascular Disease History: 1 Age Score: 2 Gender Score: 0  Physical Exam:    VS:  BP 138/72 (BP Location: Right Arm, Patient Position: Sitting, Cuff Size: Normal)   Pulse (!) 40   Ht 6' (1.829 m)   Wt 182 lb 9.6 oz (82.8 kg)   SpO2 93%   BMI 24.77 kg/m     Wt Readings from Last 3  Encounters:  10/20/24 182 lb 9.6 oz (82.8 kg)  04/13/24 183 lb 12.8 oz (83.4 kg)  10/18/23 188 lb 6.4 oz (85.5 kg)     GEN:  Well nourished, well developed in no acute distress HEENT: Normal NECK: No JVD; No carotid bruits LYMPHATICS: No lymphadenopathy CARDIAC: Bradycardic and regular, no murmurs, rubs, gallops RESPIRATORY:  Clear to auscultation without rales, wheezing or rhonchi  ABDOMEN: Soft, non-tender, non-distended MUSCULOSKELETAL:  No edema; No deformity  SKIN: Warm and dry NEUROLOGIC:  Alert and oriented x 3 PSYCHIATRIC:  Normal affect   Assessment & Plan Longstanding persistent atrial fibrillation (HCC) The patient has developed progressive slow atrial fibrillation, today with a heart rate of 40 with left bundle branch block.  EKG tracing suggests competing junctional rhythm possibly.  Recommend 3-day ZIO and refer back to EP to see Dr. Inocencio.  I suspect he is going to meet criteria for permanent pacemaker. Bilateral carotid artery stenosis Last exam in 2023 showed less than 40% bilateral carotid stenosis.  Continue medical therapy. Mixed hyperlipidemia Treated with rosuvastatin .  Total cholesterol is 126, HDL 55, LDL 55. Essential hypertension Blood pressure is controlled.  Patient treated with losartan .     Medication Adjustments/Labs and Tests Ordered: Current medicines are reviewed at length with the patient today.  Concerns regarding medicines are outlined above.  Orders Placed This Encounter  Procedures   EKG 12-Lead   No orders of the defined types were placed in this encounter.   There are no Patient Instructions on file for this visit.   Signed, Leroy Fell, MD  10/20/2024 3:00 PM    [facility]

## 2024-10-20 NOTE — Patient Instructions (Signed)
 Medication Instructions:  No medication changes were made at this visit. Continue current regimen.   *If you need a refill on your cardiac medications before your next appointment, please call your pharmacy*  Lab Work: None ordered today. If you have labs (blood work) drawn today and your tests are completely normal, you will receive your results only by: MyChart Message (if you have MyChart) OR A paper copy in the mail If you have any lab test that is abnormal or we need to change your treatment, we will call you to review the results.  Testing/Procedures: Your physician has requested that you wear a Zio heart monitor for 3 days. This will be mailed to your home with instructions on how to apply the monitor and how to return it when finished. Please allow 2 weeks after returning the heart monitor before our office calls you with the results.   Follow-Up: At [facility], you and your health needs are our priority.  As part of our continuing mission to provide you with exceptional heart care, our providers are all part of one team.  This team includes your primary Cardiologist (physician) and Advanced Practice Providers or APPs (Physician Assistants and Nurse Practitioners) who all work together to provide you with the care you need, when you need it.  Your next appointment:   6 month(s)  Provider:   Ozell Fell, MD    We recommend signing up for the patient portal called MyChart.  Sign up information is provided on this After Visit Summary.  MyChart is used to connect with patients for Virtual Visits (Telemedicine).  Patients are able to view lab/test results, encounter notes, upcoming appointments, etc.  Non-urgent messages can be sent to your provider as well.   To learn more about what you can do with MyChart, go to [email or web address].   Other Instructions ZIO XT- Long Term Monitor Instructions  Your physician has requested you wear a ZIO patch monitor for 3 days.    This is a single patch monitor. [employer] supplies one patch monitor per enrollment. Additional  stickers are not available. Please do not apply patch if you will be having a Nuclear Stress Test,  Echocardiogram, Cardiac CT, MRI, or Chest Xray during the period you would be wearing the  monitor. The patch cannot be worn during these tests. You cannot remove and re-apply the  ZIO XT patch monitor.   Your ZIO patch monitor will be mailed 3 day USPS to your address on file. It may take 3-5 days  to receive your monitor after you have been enrolled.   Once you have received your monitor, please review the enclosed instructions. Your monitor  has already been registered assigning a specific monitor serial # to you.     Billing and Patient Assistance Program Information  We have supplied [employer] with any of your insurance information on file for billing purposes.  [employer] offers a sliding scale Patient Assistance Program for patients that do not have  insurance, or whose insurance does not completely cover the cost of the ZIO monitor.  You must apply for the Patient Assistance Program to qualify for this discounted rate.   To apply, please call [employer] at [phone], select option 4, select option 2, ask to apply for  Patient Assistance Program. Meredeth will ask your household income, and how many people  are in your household. They will quote your out-of-pocket cost based on that information.  [employer] will also be able to set up a  67-month, interest-free payment plan if needed.     Applying the monitor  Shave hair from upper left chest.  Hold abrader disc by orange tab. Rub abrader in 40 strokes over the upper left chest as  indicated in your monitor instructions.  Clean area with 4 enclosed alcohol pads. Let dry.  Apply patch as indicated in monitor instructions. Patch will be placed under collarbone on left  side of chest with arrow pointing upward.  Rub patch adhesive wings for 2  minutes. Remove white label marked 1. Remove the white  label marked 2. Rub patch adhesive wings for 2 additional minutes.  While looking in a mirror, press and release button in center of patch. A small green light will  flash 3-4 times. This will be your only indicator that the monitor has been turned on.  Do not shower for the first 24 hours. You may shower after the first 24 hours.  Press the button if you feel a symptom. You will hear a small click. Record Date, Time and  Symptom in the Patient Logbook.  When you are ready to remove the patch, follow instructions on the last 2 pages of Patient  Logbook. Stick patch monitor onto the last page of Patient Logbook.   Place Patient Logbook in the blue and white box. Use locking tab on box and tape box closed  securely. The blue and white box has prepaid postage on it. Please place it in the mailbox as  soon as possible. Your physician should have your test results approximately 7 days after the  monitor has been mailed back to [facility].   Call [facility] Customer Care at [phone] if you have questions regarding  your ZIO XT patch monitor. Call them immediately if you see an orange light blinking on your  monitor.   If your monitor falls off in less than 4 days, contact our Monitor department at [phone].   If your monitor becomes loose or falls off after 4 days call [employer] at [phone] for  suggestions on securing your monitor.

## 2024-10-22 ENCOUNTER — Other Ambulatory Visit: Payer: Self-pay | Admitting: Cardiovascular Disease

## 2024-11-03 ENCOUNTER — Telehealth: Payer: Self-pay | Admitting: Cardiovascular Disease

## 2024-11-03 NOTE — Telephone Encounter (Signed)
 Call received from [employer] with abnormal results.    Patch Wear Time:  3 days and 0 hours (2025-11-14T09:34:51-0500 to 2025-11-17T10:27:11-499)   Atrial Fibrillation occurred continuously (100% burden), ranging from 33-110 bpm (avg of 49 bpm). Bundle Branch Block/IVCD was present. Idioventricular Rhythm was present. Isolated VEs were occasional (2.7%, 5661), VE Couplets were rare (<1.0%, 254), and  VE Triplets were rare (<1.0%, 2). Ventricular Bigeminy and Trigeminy were present. MD notification criteria for Slow Atrial Fibrillation met - report posted prior to notification (ED).  I called the patient and he stated that he is not experiencing any symptoms and would like to know next steps of care. It seems that the report does not have the exact time or day that this Afib occurred and the patient wanted to know exactly when it happened. I let him know we will inform Dr. Wonda and his nurse of this and they can further advise.

## 2024-11-03 NOTE — Telephone Encounter (Signed)
 He has known slow atrial fibrillation. This is fine. He can finish the monitor and I will review once it's processed. thanks

## 2024-11-03 NOTE — Telephone Encounter (Signed)
 Spoke with pt and went over Dr. Margurite response. Pt verbalized understanding of plan and had no further questions at this time.

## 2024-11-03 NOTE — Telephone Encounter (Signed)
 Abnormal results   Please advise.

## 2024-11-08 ENCOUNTER — Ambulatory Visit: Payer: Self-pay | Admitting: Cardiovascular Disease

## 2024-11-08 DIAGNOSIS — R42 Dizziness and giddiness: Secondary | ICD-10-CM | POA: Diagnosis not present

## 2024-11-08 DIAGNOSIS — I4811 Longstanding persistent atrial fibrillation: Secondary | ICD-10-CM | POA: Diagnosis not present

## 2024-12-23 ENCOUNTER — Encounter: Payer: Self-pay | Admitting: Cardiology

## 2024-12-23 ENCOUNTER — Ambulatory Visit: Admitting: Cardiology

## 2024-12-23 NOTE — Progress Notes (Unsigned)
" °  Electrophysiology Office Note:   Date:  12/23/2024  ID:  Leroy Lara, DOB [birth year], MRN [id number]  Primary Cardiologist: Ozell Fell, MD Primary Heart Failure: None Electrophysiologist: None  {Click to update primary MD,subspecialty MD or APP then REFRESH:1}    History of Present Illness:   Leroy Lara is a 79 y.o. male with h/o atrial fibrillation, carotid artery disease, hypertension, AAA seen today for routine electrophysiology followup.   Discussed the use of AI scribe software for clinical note transcription with the patient, who gave verbal consent to proceed.  History of Present Illness   he denies chest pain, palpitations, dyspnea, PND, orthopnea, nausea, vomiting, dizziness, syncope, edema, weight gain, or early satiety.   Review of systems complete and found to be negative unless listed in HPI.   EP Information / Studies Reviewed:    {EKGtoday:28818}        Risk Assessment/Calculations:    CHA2DS2-VASc Score = 4  {Confirm score is correct.  If not, click here to update score.  REFRESH note.  :1} This indicates a 4.8% annual risk of stroke. The patient's score is based upon: CHF History: 0 HTN History: 1 Diabetes History: 0 Stroke History: 0 Vascular Disease History: 1 Age Score: 2 Gender Score: 0   {This patient has a significant risk of stroke if diagnosed with atrial fibrillation.  Please consider VKA or DOAC agent for anticoagulation if the bleeding risk is acceptable.   You can also use the SmartPhrase .[email or web address] for documentation.   :[id number]}         Physical Exam:   VS:  There were no vitals taken for this visit.   Wt Readings from Last 3 Encounters:  10/20/24 182 lb 9.6 oz (82.8 kg)  04/13/24 183 lb 12.8 oz (83.4 kg)  10/18/23 188 lb 6.4 oz (85.5 kg)     GEN: Well nourished, well developed in no acute distress NECK: No JVD; No carotid bruits CARDIAC: {EPRHYTHM:28826}, no murmurs, rubs, gallops RESPIRATORY:  Clear to  auscultation without rales, wheezing or rhonchi  ABDOMEN: Soft, non-tender, non-distended EXTREMITIES:  No edema; No deformity   ASSESSMENT AND PLAN:    1.  Longstanding persistent atrial fibrillation:***  2.  Hypertension:***  3.  Coronary artery disease: No flow-limiting stenosis.  No current chest pain.  Plan per primary cardiology.  4.  Secondary hypercoagulable state: On Eliquis   Follow up with {EPMDS:28135::EP Team} {EPFOLLOW LE:71826}  Signed, Sharnay Cashion Gladis Norton, MD  "

## 2025-02-03 ENCOUNTER — Ambulatory Visit: Admitting: Cardiology

## 2025-06-03 ENCOUNTER — Ambulatory Visit: Admitting: Cardiovascular Disease
